# Patient Record
Sex: Female | Born: 1960 | Race: White | Hispanic: No | Marital: Single | Smoking: Current every day smoker
Health system: Southern US, Community
[De-identification: ages and names within clinical notes are randomized; demographics above are authoritative.]

## PROBLEM LIST (undated history)

## (undated) DIAGNOSIS — G8929 Other chronic pain: Secondary | ICD-10-CM

## (undated) DIAGNOSIS — IMO0001 Reserved for inherently not codable concepts without codable children: Secondary | ICD-10-CM

## (undated) DIAGNOSIS — R519 Headache, unspecified: Secondary | ICD-10-CM

## (undated) DIAGNOSIS — J449 Chronic obstructive pulmonary disease, unspecified: Secondary | ICD-10-CM

## (undated) DIAGNOSIS — F319 Bipolar disorder, unspecified: Secondary | ICD-10-CM

## (undated) DIAGNOSIS — IMO0002 Reserved for concepts with insufficient information to code with codable children: Secondary | ICD-10-CM

## (undated) DIAGNOSIS — M797 Fibromyalgia: Secondary | ICD-10-CM

## (undated) DIAGNOSIS — J45909 Unspecified asthma, uncomplicated: Secondary | ICD-10-CM

## (undated) DIAGNOSIS — M1990 Unspecified osteoarthritis, unspecified site: Secondary | ICD-10-CM

## (undated) DIAGNOSIS — F329 Major depressive disorder, single episode, unspecified: Secondary | ICD-10-CM

## (undated) DIAGNOSIS — I10 Essential (primary) hypertension: Secondary | ICD-10-CM

## (undated) DIAGNOSIS — J302 Other seasonal allergic rhinitis: Secondary | ICD-10-CM

## (undated) DIAGNOSIS — Z87898 Personal history of other specified conditions: Secondary | ICD-10-CM

## (undated) DIAGNOSIS — F32A Depression, unspecified: Secondary | ICD-10-CM

## (undated) DIAGNOSIS — G4700 Insomnia, unspecified: Secondary | ICD-10-CM

## (undated) DIAGNOSIS — M48062 Spinal stenosis, lumbar region with neurogenic claudication: Secondary | ICD-10-CM

## (undated) DIAGNOSIS — M51369 Other intervertebral disc degeneration, lumbar region without mention of lumbar back pain or lower extremity pain: Secondary | ICD-10-CM

## (undated) DIAGNOSIS — E119 Type 2 diabetes mellitus without complications: Secondary | ICD-10-CM

## (undated) DIAGNOSIS — Z87442 Personal history of urinary calculi: Secondary | ICD-10-CM

## (undated) DIAGNOSIS — M542 Cervicalgia: Secondary | ICD-10-CM

## (undated) DIAGNOSIS — M549 Dorsalgia, unspecified: Secondary | ICD-10-CM

## (undated) DIAGNOSIS — F419 Anxiety disorder, unspecified: Secondary | ICD-10-CM

## (undated) DIAGNOSIS — M5136 Other intervertebral disc degeneration, lumbar region: Secondary | ICD-10-CM

## (undated) DIAGNOSIS — R51 Headache: Secondary | ICD-10-CM

## (undated) HISTORY — PX: TUBAL LIGATION: SHX77

## (undated) HISTORY — PX: ABDOMINAL HYSTERECTOMY: SHX81

## (undated) HISTORY — PX: BREAST LUMPECTOMY: SHX2

## (undated) HISTORY — PX: KNEE SURGERY: SHX244

## (undated) HISTORY — PX: ABDOMINAL EXPLORATION SURGERY: SHX538

## (undated) HISTORY — PX: COLONOSCOPY: SHX174

## (undated) HISTORY — PX: CHOLECYSTECTOMY: SHX55

## (undated) HISTORY — PX: MULTIPLE TOOTH EXTRACTIONS: SHX2053

## (undated) HISTORY — PX: BREAST SURGERY: SHX581

## (undated) HISTORY — PX: DILATION AND CURETTAGE OF UTERUS: SHX78

## (undated) HISTORY — PX: BACK SURGERY: SHX140

---

## 2002-12-20 ENCOUNTER — Emergency Department (HOSPITAL_COMMUNITY): Admission: EM | Admit: 2002-12-20 | Discharge: 2002-12-20 | Payer: Self-pay | Admitting: Emergency Medicine

## 2002-12-20 ENCOUNTER — Encounter: Payer: Self-pay | Admitting: Emergency Medicine

## 2002-12-22 ENCOUNTER — Encounter: Admission: RE | Admit: 2002-12-22 | Discharge: 2002-12-22 | Payer: Self-pay | Admitting: Internal Medicine

## 2004-07-22 HISTORY — PX: ABDOMINAL HYSTERECTOMY: SHX81

## 2005-06-05 ENCOUNTER — Ambulatory Visit: Payer: Self-pay | Admitting: Cardiology

## 2009-11-03 ENCOUNTER — Emergency Department (HOSPITAL_COMMUNITY): Admission: EM | Admit: 2009-11-03 | Discharge: 2009-11-04 | Payer: Self-pay | Admitting: Emergency Medicine

## 2009-11-04 IMAGING — CR DG CHEST 2V
2 series · 2 of 2 positions shown · non-contrast
Comparison: None.

CLINICAL DATA: Upper back pain

CHEST - 2 VIEW

[w chest pa]
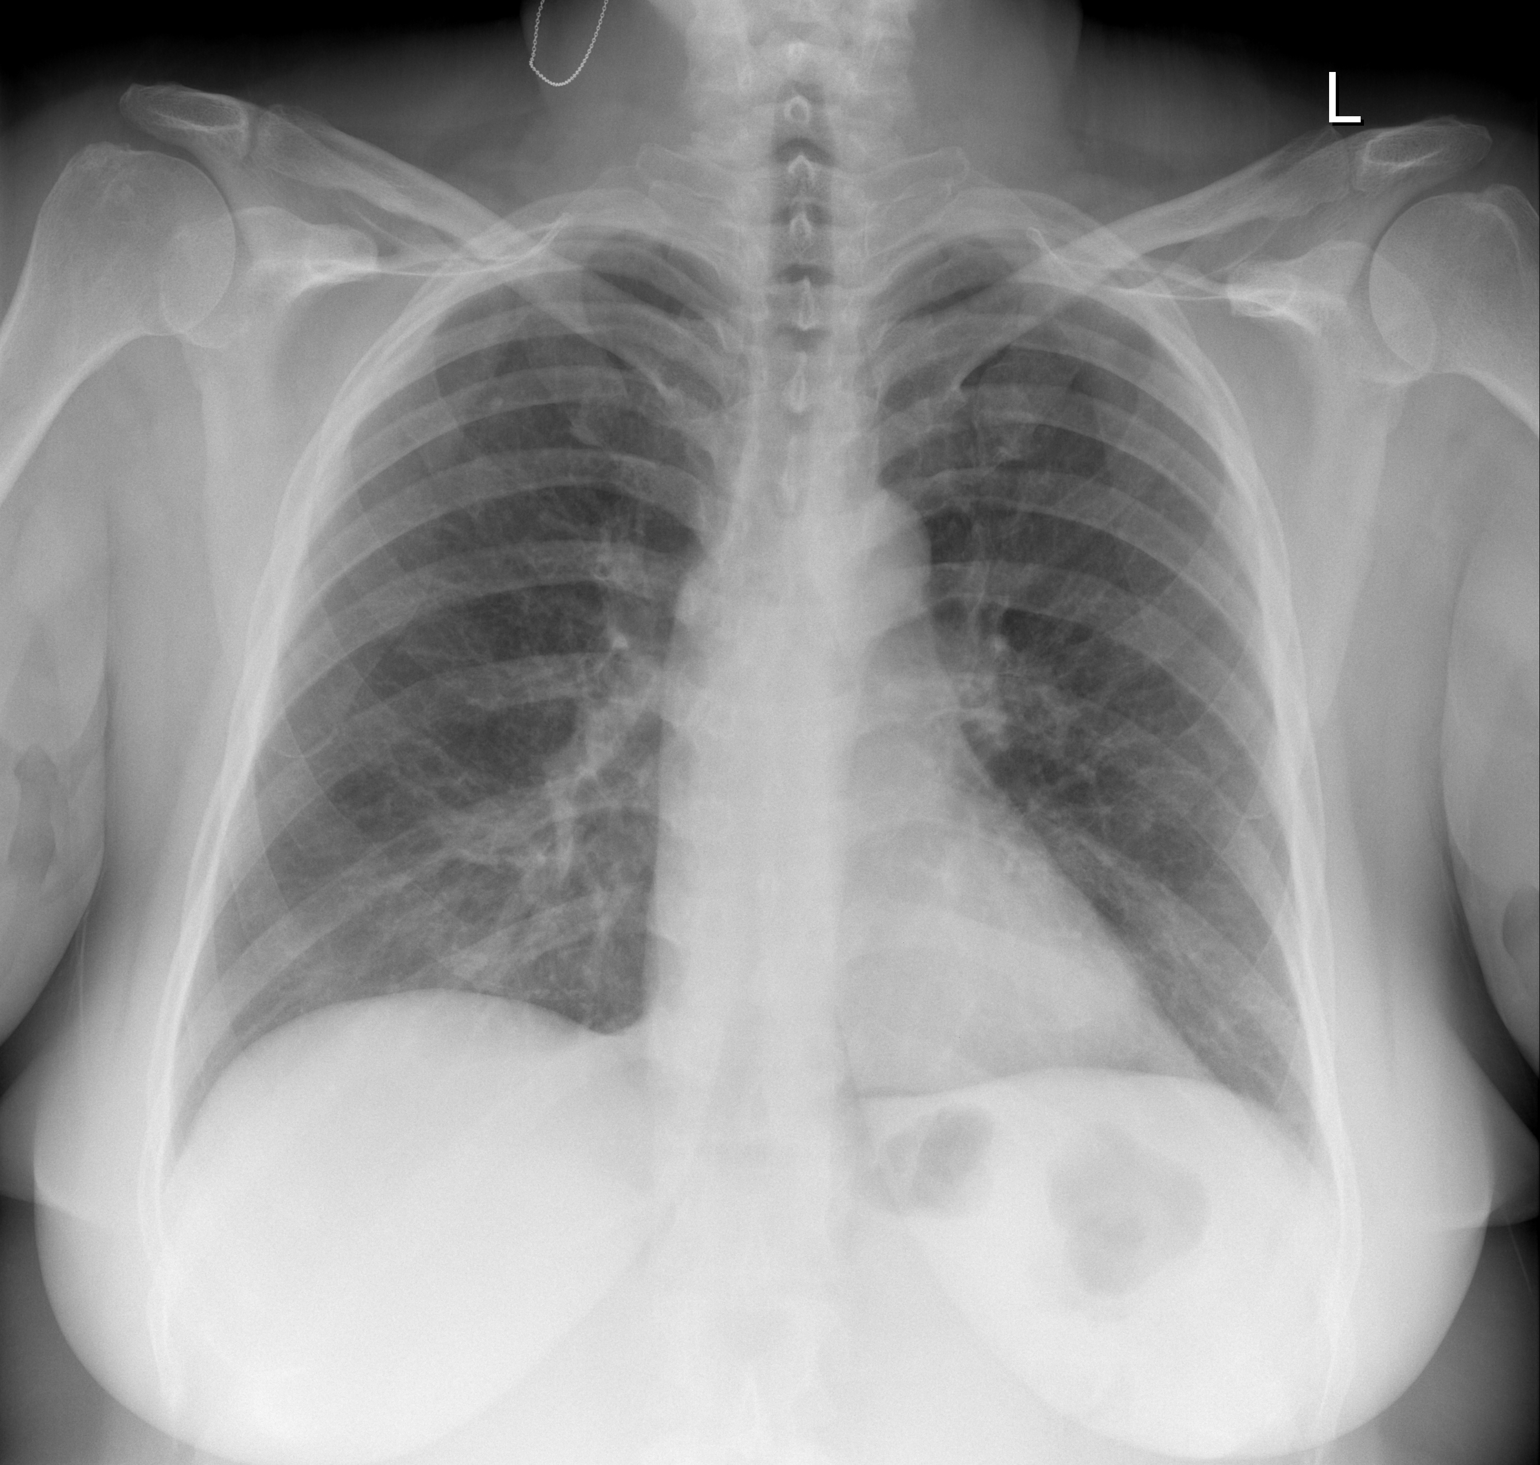

[w chest lat]
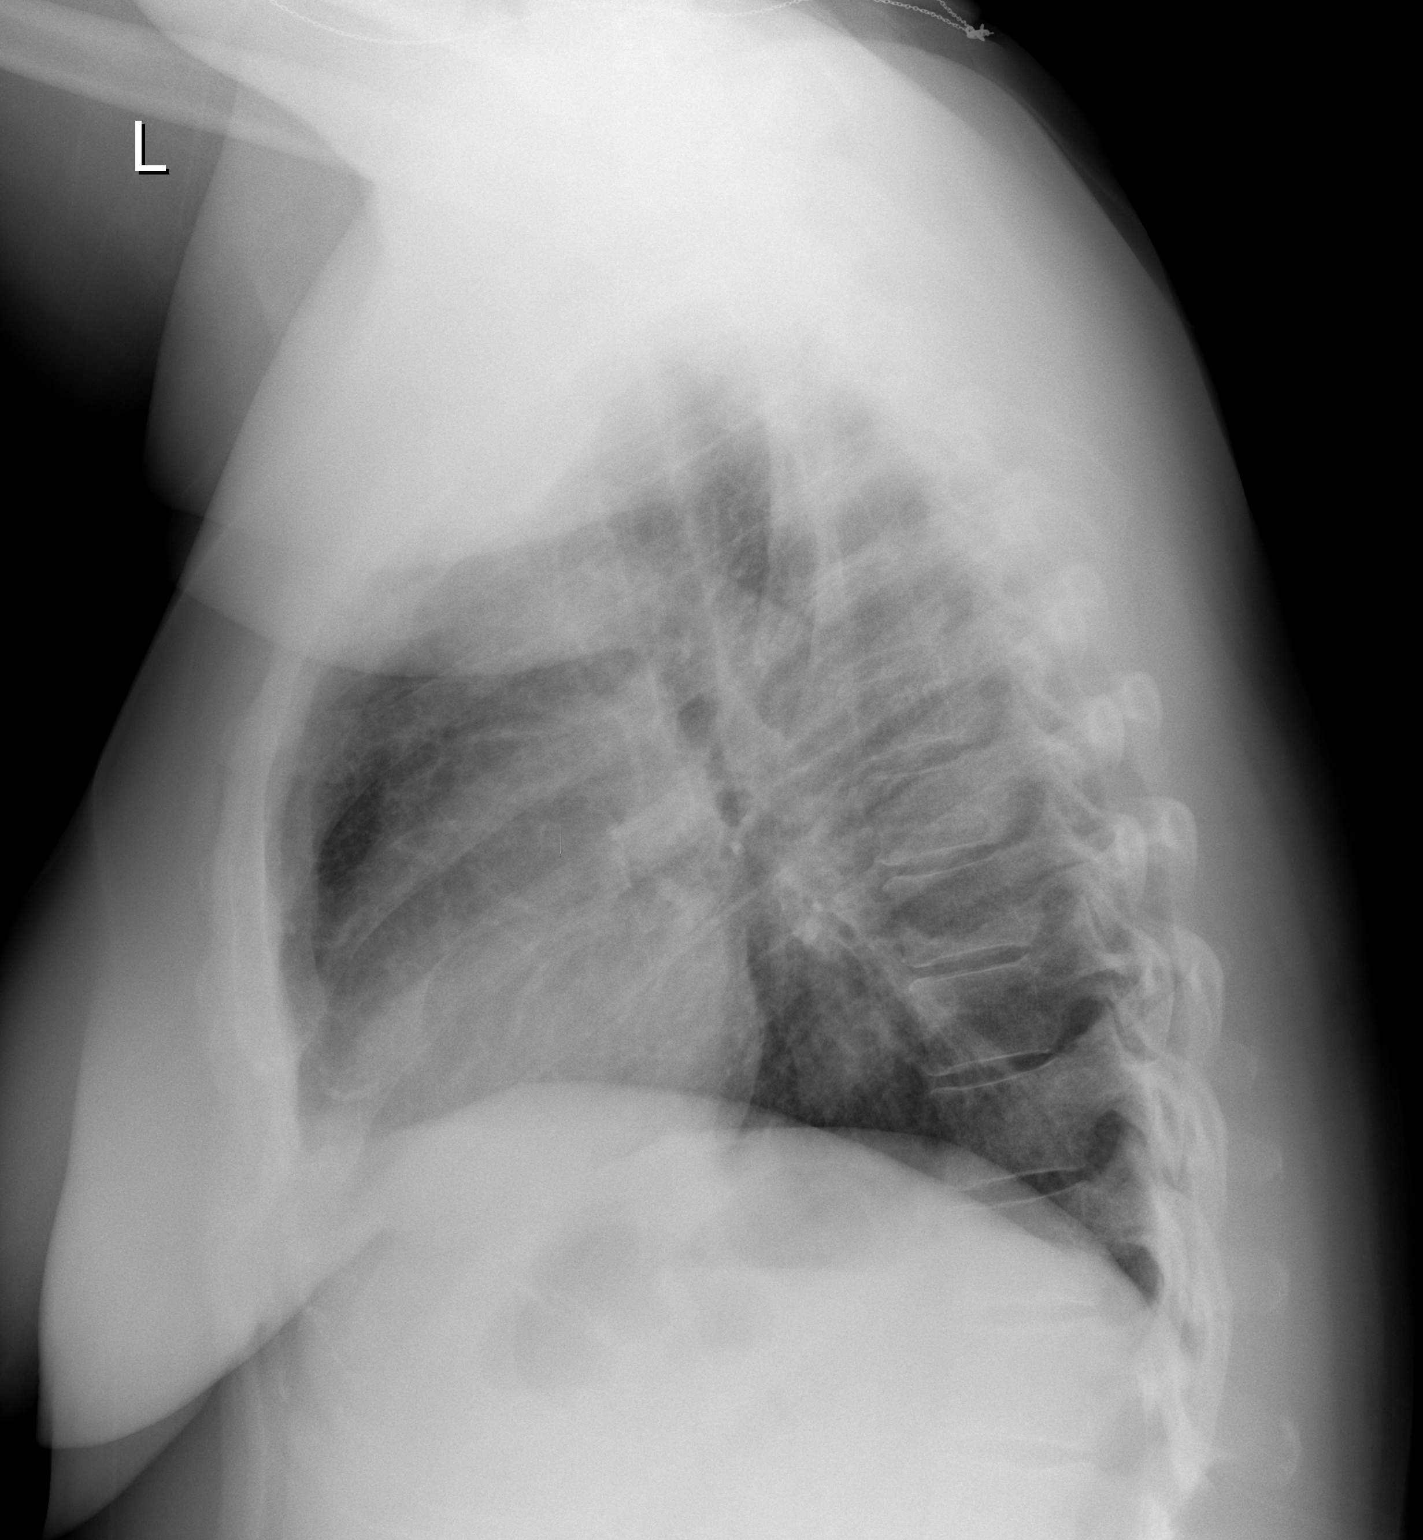

[2 of 2 positions shown; findings below may reference images not displayed]

FINDINGS: Cardiomediastinal silhouette is within normal limits. The
lungs are clear. No pleural effusion.  No pneumothorax.  No acute
osseous abnormality.
IMPRESSION: Normal chest.

## 2009-11-11 ENCOUNTER — Emergency Department (HOSPITAL_COMMUNITY): Admission: EM | Admit: 2009-11-11 | Discharge: 2009-11-12 | Payer: Self-pay | Admitting: Emergency Medicine

## 2009-11-11 IMAGING — CR DG CHEST 2V
2 series · 2 of 2 positions shown · non-contrast
Comparison: Chest radiograph performed [DATE]

CLINICAL DATA: Shortness of breath; history of smoking.

CHEST - 2 VIEW

[w chest pa]
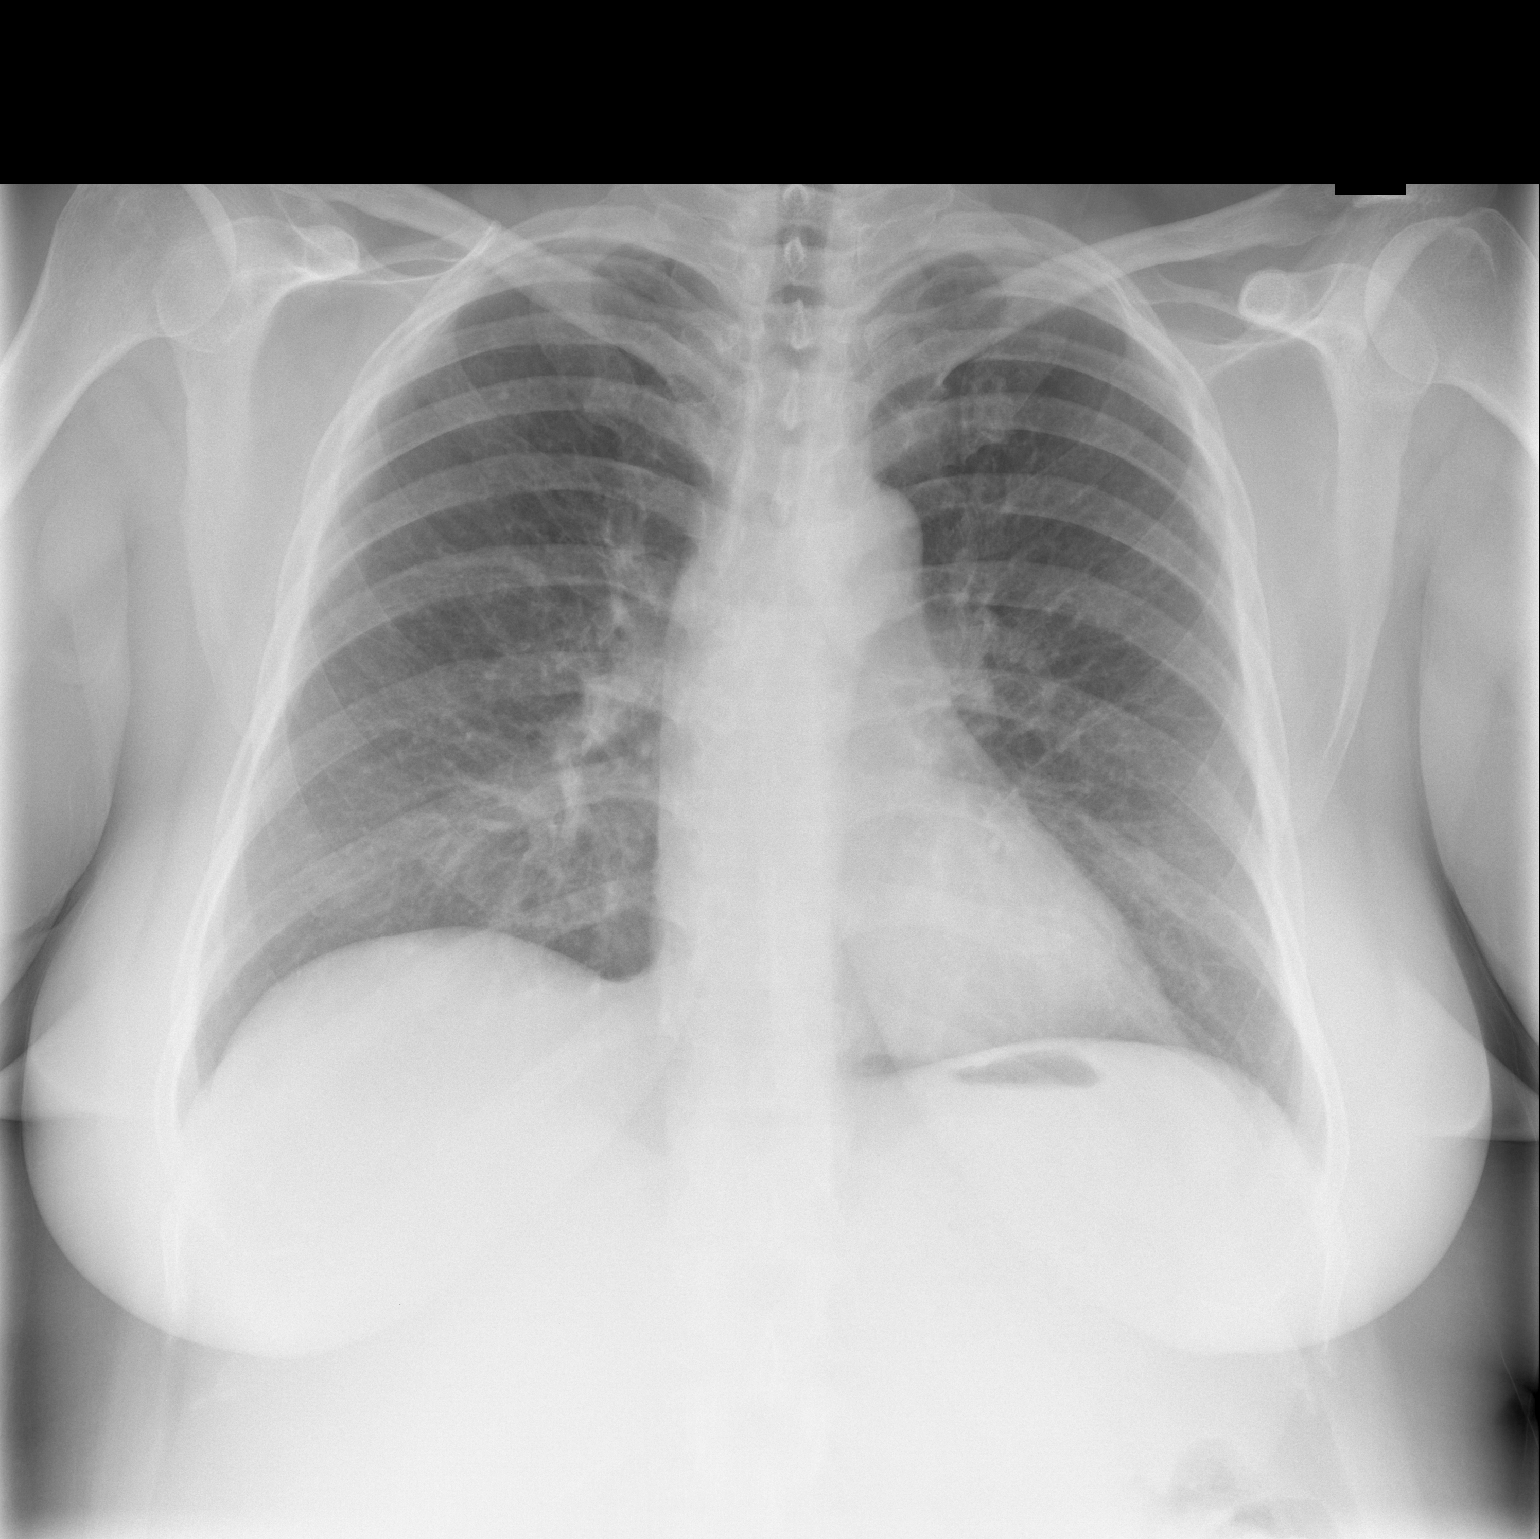

[w chest lat]
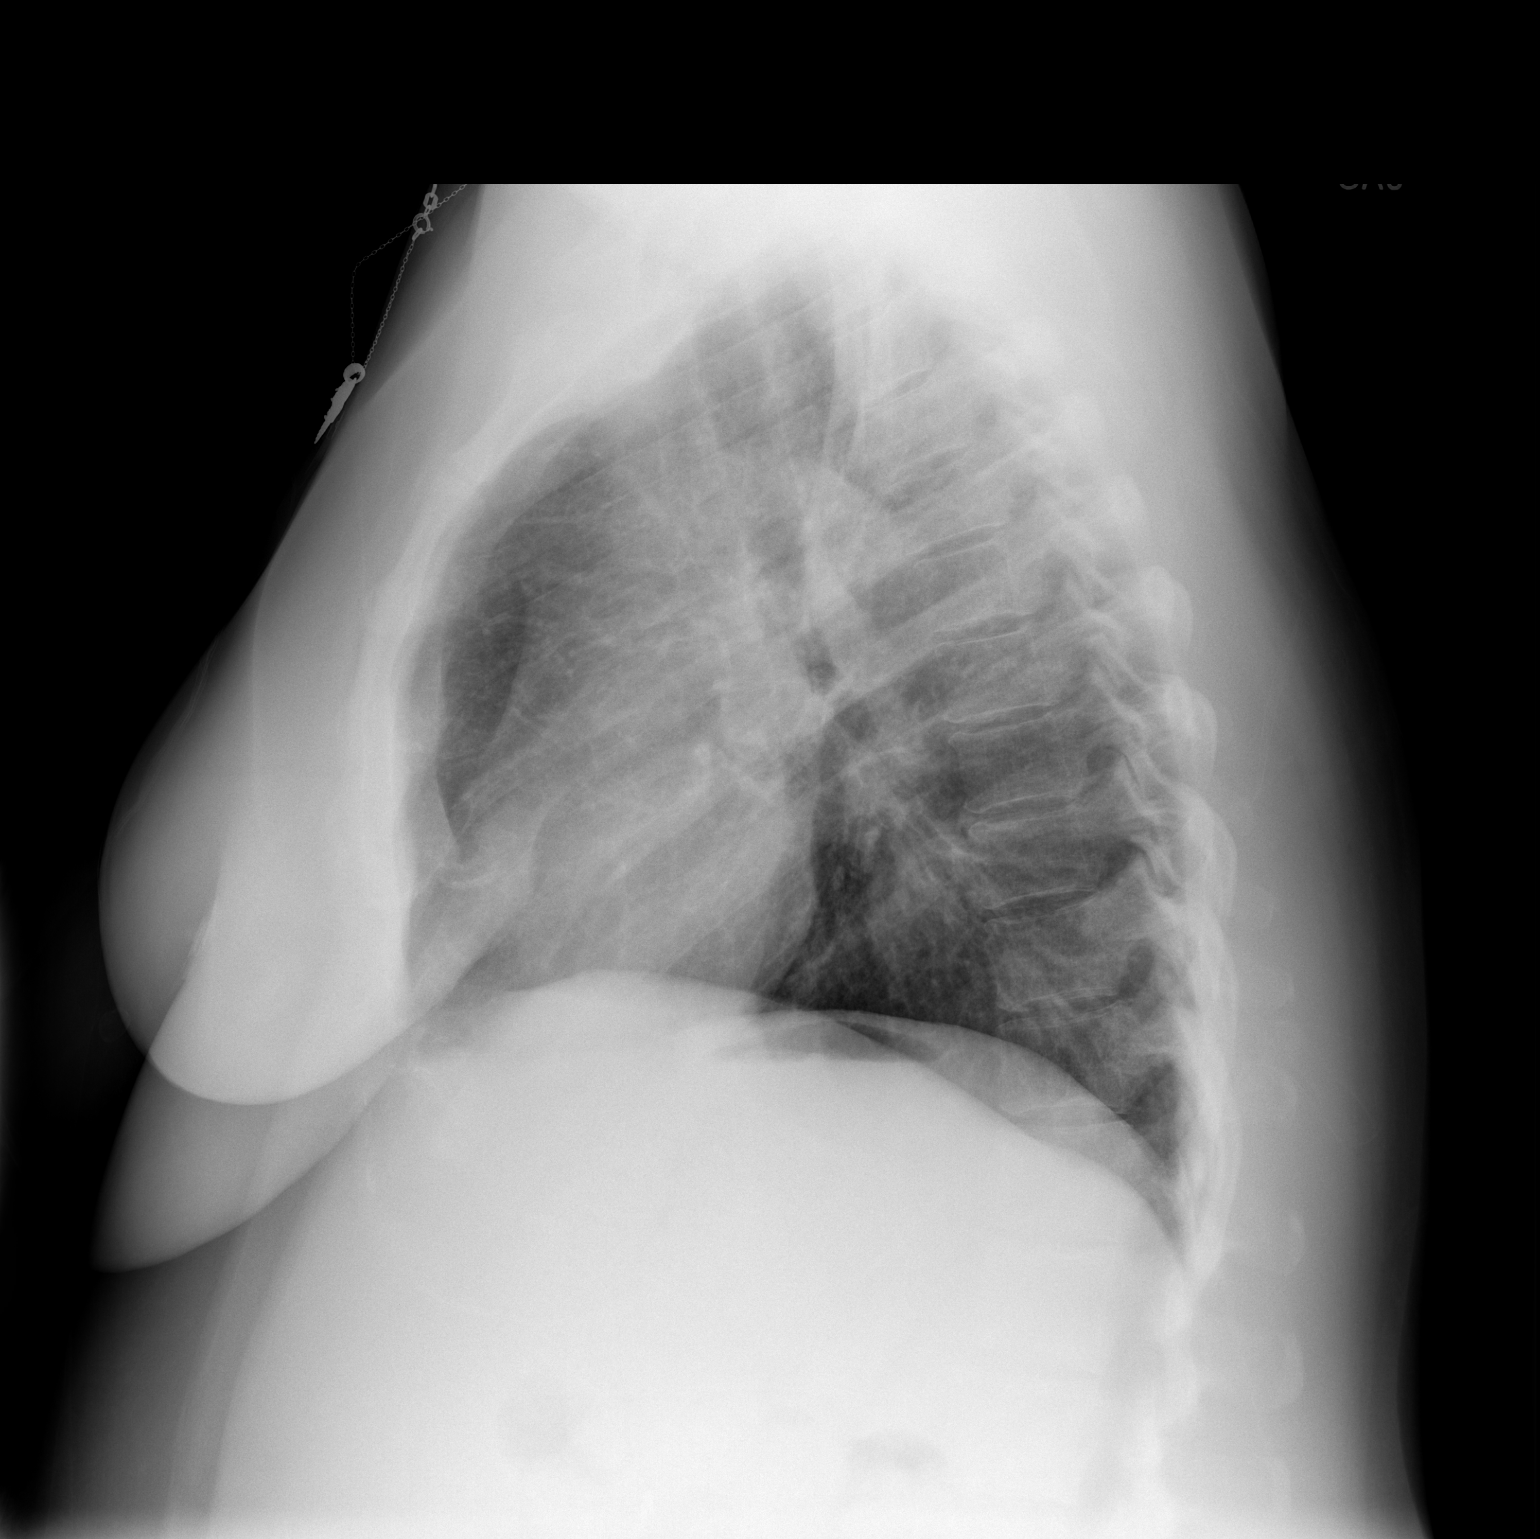

[2 of 2 positions shown; findings below may reference images not displayed]

FINDINGS: The lungs are well-aerated and clear.  There is no
evidence of focal opacification, pleural effusion or pneumothorax.

The heart is normal in size; the mediastinal contour is within
normal limits.  No acute osseous abnormalities are seen.
IMPRESSION: No acute cardiopulmonary process seen.

## 2009-12-05 ENCOUNTER — Emergency Department (HOSPITAL_COMMUNITY): Admission: EM | Admit: 2009-12-05 | Discharge: 2009-12-05 | Payer: Self-pay | Admitting: Emergency Medicine

## 2010-10-09 LAB — CBC
Hemoglobin: 13.5 g/dL (ref 12.0–15.0)
MCHC: 35.1 g/dL (ref 30.0–36.0)
MCV: 99.1 fL (ref 78.0–100.0)
RBC: 3.87 MIL/uL (ref 3.87–5.11)
RDW: 14.8 % (ref 11.5–15.5)

## 2010-10-09 LAB — URINALYSIS, ROUTINE W REFLEX MICROSCOPIC
Bilirubin Urine: NEGATIVE
Ketones, ur: NEGATIVE mg/dL
Nitrite: NEGATIVE
Protein, ur: NEGATIVE mg/dL
Specific Gravity, Urine: 1.018 (ref 1.005–1.030)
Urobilinogen, UA: 1 mg/dL (ref 0.0–1.0)

## 2010-10-09 LAB — BASIC METABOLIC PANEL
CO2: 26 mEq/L (ref 19–32)
Calcium: 9.4 mg/dL (ref 8.4–10.5)
Chloride: 99 mEq/L (ref 96–112)
GFR calc Af Amer: >60 mL/min (ref >60)
Potassium: 3 mEq/L — ABNORMAL LOW (ref 3.5–5.1)
Sodium: 136 mEq/L (ref 135–145)

## 2010-10-09 LAB — DIFFERENTIAL
Basophils Absolute: 0.3 10*3/uL — ABNORMAL HIGH (ref 0.0–0.1)
Basophils Relative: 2 % — ABNORMAL HIGH (ref 0–1)
Eosinophils Absolute: 0.3 10*3/uL (ref 0.0–0.7)
Monocytes Absolute: 1.1 10*3/uL — ABNORMAL HIGH (ref 0.1–1.0)
Monocytes Relative: 8 % (ref 3–12)
Neutro Abs: 8.3 10*3/uL — ABNORMAL HIGH (ref 1.7–7.7)

## 2010-10-09 LAB — HEPATIC FUNCTION PANEL
AST: 42 U/L — ABNORMAL HIGH (ref 0–37)
Albumin: 3.1 g/dL — ABNORMAL LOW (ref 3.5–5.2)
Total Bilirubin: 0.5 mg/dL (ref 0.3–1.2)
Total Protein: 6.9 g/dL (ref 6.0–8.3)

## 2010-10-09 LAB — BRAIN NATRIURETIC PEPTIDE: Pro B Natriuretic peptide (BNP): <30 pg/mL (ref 0.0–100.0)

## 2013-11-18 ENCOUNTER — Emergency Department (HOSPITAL_COMMUNITY)
Admission: EM | Admit: 2013-11-18 | Discharge: 2013-11-19 | Disposition: A | Discharge status: Home / Self Care | Payer: Medicaid Other | Attending: Emergency Medicine | Admitting: Emergency Medicine

## 2013-11-18 ENCOUNTER — Encounter (HOSPITAL_COMMUNITY): Payer: Self-pay | Admitting: Emergency Medicine

## 2013-11-18 DIAGNOSIS — M549 Dorsalgia, unspecified: Secondary | ICD-10-CM | POA: Insufficient documentation

## 2013-11-18 DIAGNOSIS — I10 Essential (primary) hypertension: Secondary | ICD-10-CM | POA: Insufficient documentation

## 2013-11-18 DIAGNOSIS — J4489 Other specified chronic obstructive pulmonary disease: Secondary | ICD-10-CM | POA: Insufficient documentation

## 2013-11-18 DIAGNOSIS — J449 Chronic obstructive pulmonary disease, unspecified: Secondary | ICD-10-CM | POA: Insufficient documentation

## 2013-11-18 DIAGNOSIS — F17200 Nicotine dependence, unspecified, uncomplicated: Secondary | ICD-10-CM | POA: Insufficient documentation

## 2013-11-18 DIAGNOSIS — Z8739 Personal history of other diseases of the musculoskeletal system and connective tissue: Secondary | ICD-10-CM | POA: Insufficient documentation

## 2013-11-18 HISTORY — DX: Chronic obstructive pulmonary disease, unspecified: J44.9

## 2013-11-18 HISTORY — DX: Reserved for concepts with insufficient information to code with codable children: IMO0002

## 2013-11-18 HISTORY — DX: Other seasonal allergic rhinitis: J30.2

## 2013-11-18 HISTORY — DX: Insomnia, unspecified: G47.00

## 2013-11-18 HISTORY — DX: Essential (primary) hypertension: I10

## 2013-11-18 HISTORY — DX: Unspecified asthma, uncomplicated: J45.909

## 2013-11-18 MED ORDER — OXYCODONE-ACETAMINOPHEN 5-325 MG PO TABS
2.0000 | ORAL_TABLET | Freq: Once | ORAL | Status: AC
  Administered 2013-11-19: 2 via ORAL
  Filled 2013-11-18: qty 2

## 2013-11-18 MED ORDER — OXYCODONE-ACETAMINOPHEN 5-325 MG PO TABS: 1.0000 | ORAL_TABLET | ORAL | Status: DC | PRN

## 2013-11-18 NOTE — ED Notes (Signed)
Pt c/o seasonal allergies making her cough. Reports upper mid back pain that is worse with movement and when she coughs.

## 2013-11-18 NOTE — ED Provider Notes (Signed)
CSN: [id number]     Arrival date & time 11/18/13  2307 History  This chart was scribed for Joya Gaskinsonald W Hitesh Fouche, MD by Charline BillsEssence Howell, ED Scribe. The patient was seen in room APA12/APA12. Patient's care was started at 11:46 PM.    Chief Complaint  Patient presents with  . Cough  . Back Pain    Patient is a 53 y.o. female presenting with cough and back pain. The history is provided by the patient. No language interpreter was used.  Cough Cough characteristics:  Dry Severity:  Severe Onset quality:  Unable to specify Progression:  Worsening Chronicity:  New Smoker: yes   Relieved by:  Nothing Worsened by:  Deep breathing Associated symptoms: no chest pain and no fever   Back Pain Associated symptoms: no abdominal pain, no chest pain, no dysuria, no fever and no weakness    HPI Comments: Colin InaConnie Pagan is a 53 y.o. female who presents to the Emergency Department complaining of gradually worsening cough. Pt states that she has seasonal allergies which is making her cough worse. She also reports associated SOB and upper back pain. She thinks that she has pulled a muscle from coughing hard. She denies fever, vomiting, abdominal pain, chest pain, dysuria, difficulty urinating, and any new weakness in extremities. Pt has takes Tylenol and used a heating pad with no relief.   Pt is a smoker. She states that she quit but started smoking again 2-3 months ago.  Past Medical History  Diagnosis Date  . Asthma   . COPD (chronic obstructive pulmonary disease)   . Seasonal allergies   . Hypertension   . DDD (degenerative disc disease)   . Insomnia    Past Surgical History  Procedure Laterality Date  . Breast surgery    . Cholecystectomy    . Abdominal hysterectomy    . Tubal ligation    . Abdominal exploration surgery    . Knee surgery     No family history on file. History  Substance Use Topics  . Smoking status: Current Every Day Smoker -- 1.00 packs/day    Types: Cigarettes  .  Smokeless tobacco: Not on file  . Alcohol Use: No   OB History   Grav Para Term Preterm Abortions TAB SAB Ect Mult Living                 Review of Systems  Constitutional: Negative for fever.  Respiratory: Positive for cough.   Cardiovascular: Negative for chest pain.  Gastrointestinal: Negative for vomiting and abdominal pain.  Genitourinary: Negative for dysuria and difficulty urinating.  Musculoskeletal: Positive for back pain.  Neurological: Negative for weakness.     Allergies  Ibuprofen; Keflex; Nsaids; Remeron; Sulfa antibiotics; Toradol; and Wellbutrin  Home Medications   Prior to Admission medications   Not on File   Triage Vitals: BP 166/90  Pulse 107  Temp(Src) 98.7 F (37.1 C) (Oral)  Resp 24  Ht 5\' 4"  (1.626 m)  Wt 230 lb (104.327 kg)  BMI 39.46 kg/m2  SpO2 97% Physical Exam CONSTITUTIONAL: Well developed/well nourished HEAD: Normocephalic/atraumatic EYES: EOMI/PERRL ENMT: Mucous membranes moist NECK: supple no meningeal signs SPINE: mild thoracic tenderness, No bruising/crepitance/stepoffs noted to spine CV: S1/S2 noted, no murmurs/rubs/gallops noted LUNGS: Lungs are clear to auscultation bilaterally, no apparent distress ABDOMEN: soft, nontender, no rebound or guarding GU:no cva tenderness NEURO: Awake/alert, equal motor 5/5 strength noted with the following: hip flexion/knee flexion/extension, foot dorsi/plantar flexion, no sensory deficit in any dermatome.  Equal patellar/achilles  reflex noted (2+) in bilateral lower extremities.  Pt is able to ambulate unassisted. EXTREMITIES: pulses normal, full ROM SKIN: warm, color normal PSYCH: no abnormalities of mood noted     ED Course  Procedures DIAGNOSTIC STUDIES: Oxygen Saturation is 97% on RA, normal by my interpretation.    COORDINATION OF CARE: 11:50 PM-Discussed treatment plan with pt at bedside and pt agreed to plan.   MDM   Final diagnoses:  Back pain    Nursing notes including  past medical history and social history reviewed and considered in documentation   I personally performed the services described in this documentation, which was scribed in my presence. The recorded information has been reviewed and is accurate.    Joya Gaskinsonald W Donnae Michels, MD 11/19/13 [phone]

## 2013-11-18 NOTE — Discharge Instructions (Signed)

## 2014-03-18 ENCOUNTER — Emergency Department (HOSPITAL_COMMUNITY)
Admission: EM | Admit: 2014-03-18 | Discharge: 2014-03-18 | Disposition: A | Discharge status: Home / Self Care | Payer: Medicaid Other | Attending: Emergency Medicine | Admitting: Emergency Medicine

## 2014-03-18 ENCOUNTER — Encounter (HOSPITAL_COMMUNITY): Payer: Self-pay | Admitting: Emergency Medicine

## 2014-03-18 ENCOUNTER — Emergency Department (HOSPITAL_COMMUNITY): Payer: Medicaid Other

## 2014-03-18 DIAGNOSIS — Y929 Unspecified place or not applicable: Secondary | ICD-10-CM | POA: Diagnosis not present

## 2014-03-18 DIAGNOSIS — Z8739 Personal history of other diseases of the musculoskeletal system and connective tissue: Secondary | ICD-10-CM | POA: Diagnosis not present

## 2014-03-18 DIAGNOSIS — Z79899 Other long term (current) drug therapy: Secondary | ICD-10-CM | POA: Insufficient documentation

## 2014-03-18 DIAGNOSIS — S335XXA Sprain of ligaments of lumbar spine, initial encounter: Secondary | ICD-10-CM | POA: Diagnosis not present

## 2014-03-18 DIAGNOSIS — S39012A Strain of muscle, fascia and tendon of lower back, initial encounter: Secondary | ICD-10-CM

## 2014-03-18 DIAGNOSIS — IMO0002 Reserved for concepts with insufficient information to code with codable children: Secondary | ICD-10-CM | POA: Insufficient documentation

## 2014-03-18 DIAGNOSIS — I10 Essential (primary) hypertension: Secondary | ICD-10-CM | POA: Insufficient documentation

## 2014-03-18 DIAGNOSIS — W108XXA Fall (on) (from) other stairs and steps, initial encounter: Secondary | ICD-10-CM | POA: Insufficient documentation

## 2014-03-18 DIAGNOSIS — F17200 Nicotine dependence, unspecified, uncomplicated: Secondary | ICD-10-CM | POA: Diagnosis not present

## 2014-03-18 DIAGNOSIS — Y9389 Activity, other specified: Secondary | ICD-10-CM | POA: Insufficient documentation

## 2014-03-18 DIAGNOSIS — J4489 Other specified chronic obstructive pulmonary disease: Secondary | ICD-10-CM | POA: Insufficient documentation

## 2014-03-18 DIAGNOSIS — W19XXXA Unspecified fall, initial encounter: Secondary | ICD-10-CM

## 2014-03-18 DIAGNOSIS — J449 Chronic obstructive pulmonary disease, unspecified: Secondary | ICD-10-CM | POA: Insufficient documentation

## 2014-03-18 IMAGING — CR DG LUMBAR SPINE COMPLETE 4+V
5 series · 5 of 5 positions shown · non-contrast
Comparison: None.

CLINICAL DATA: Lower back pain

EXAM:
LUMBAR SPINE - COMPLETE 4+ VIEW

[view not recorded (1 of 5)]
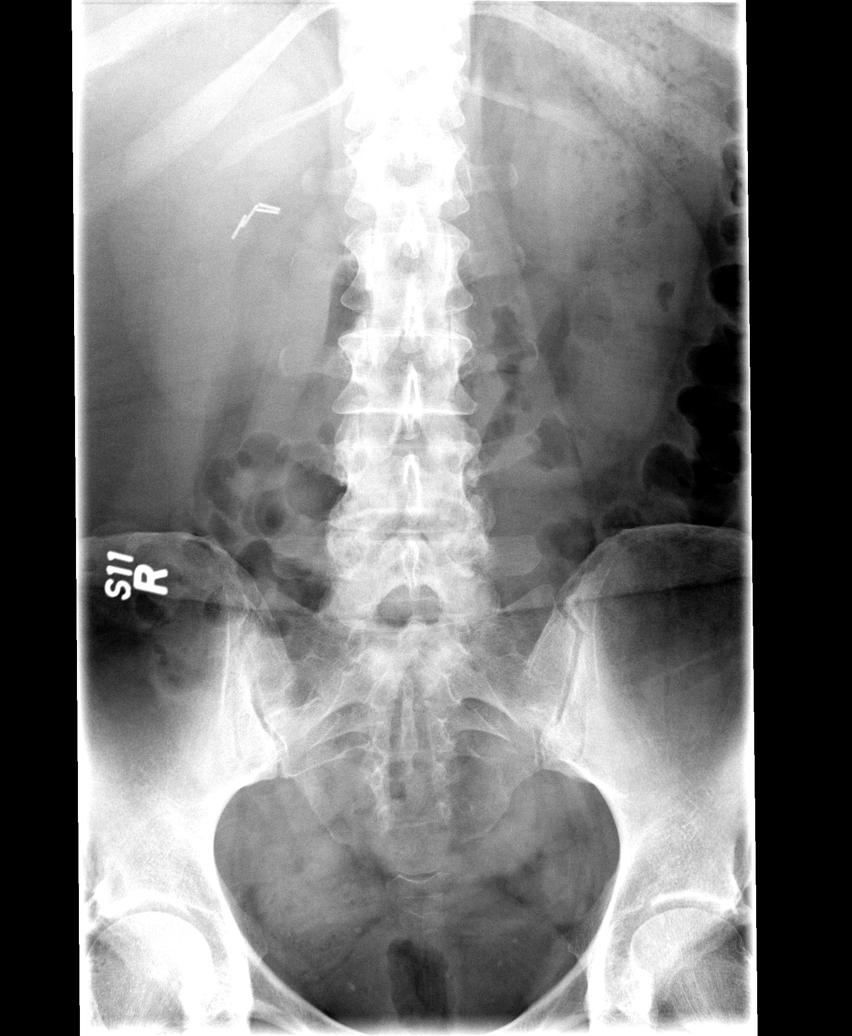

[view not recorded (2 of 5)]
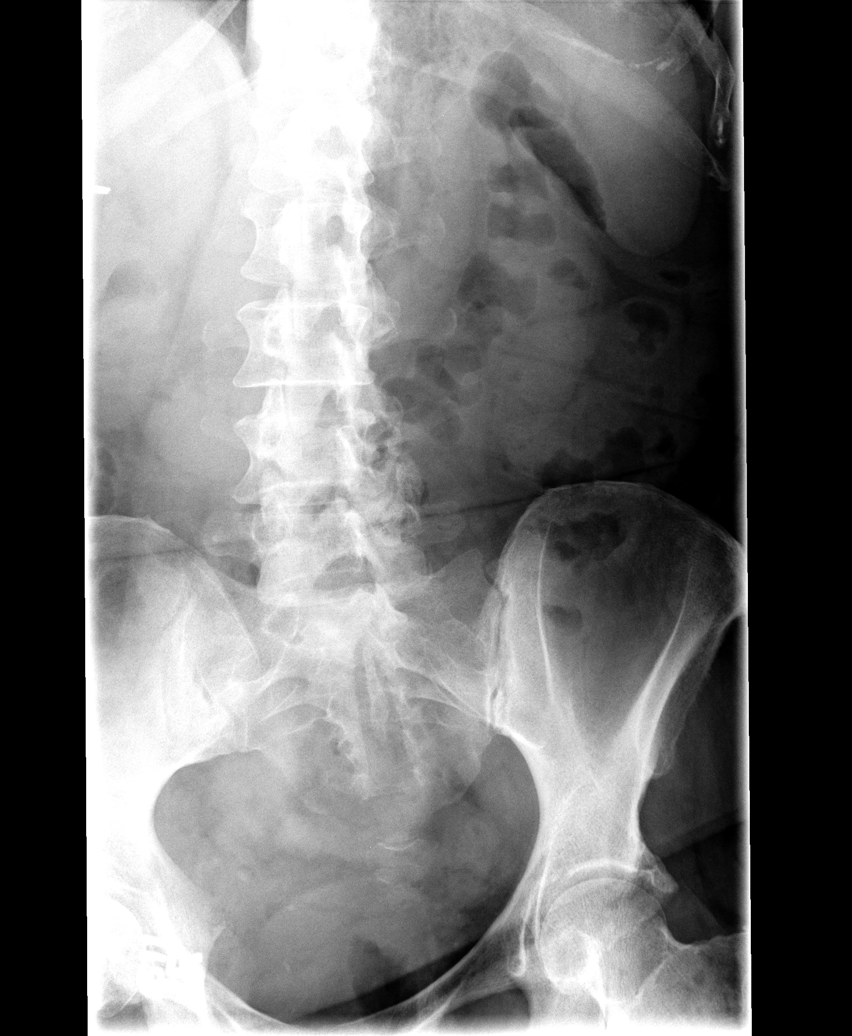

[view not recorded (3 of 5)]
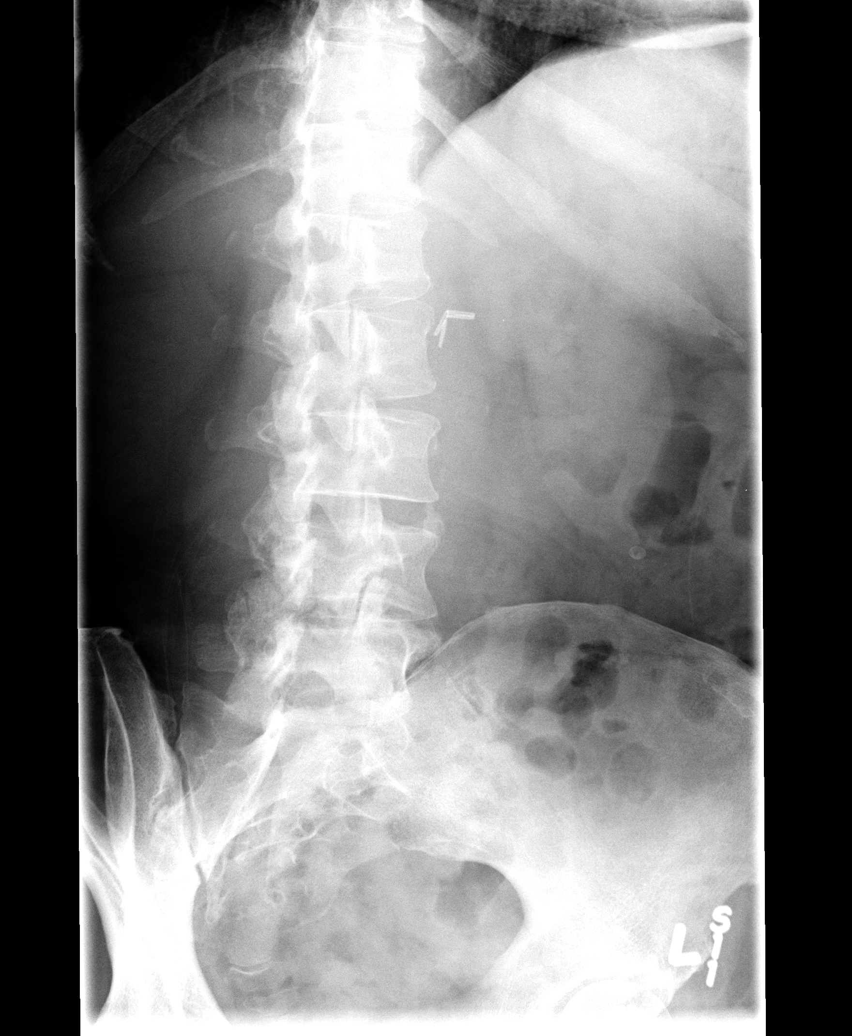

[view not recorded (4 of 5)]
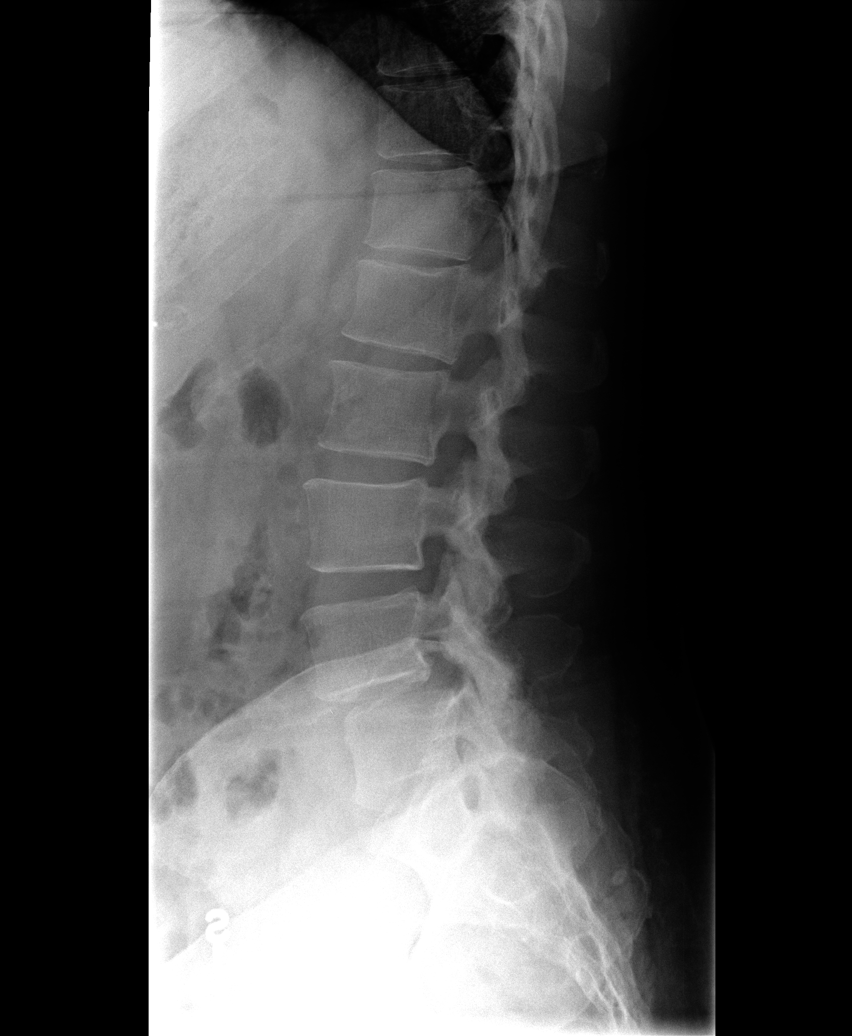

[view not recorded (5 of 5)]
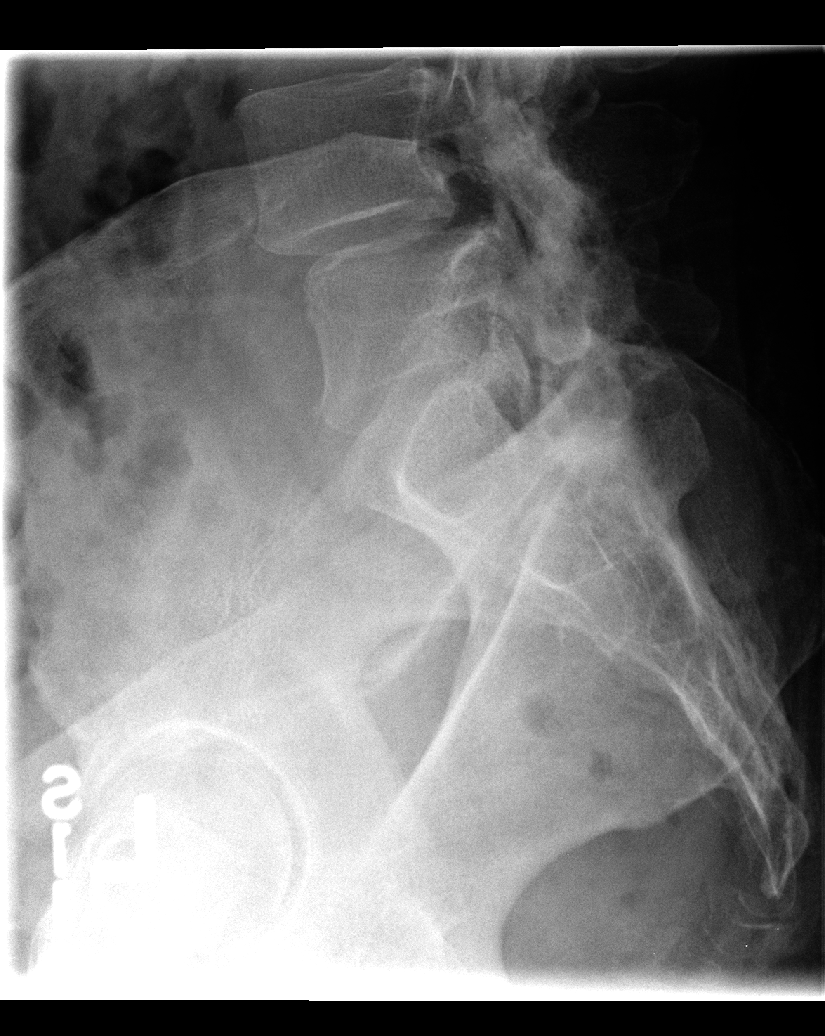

[5 of 5 positions shown; findings below may reference images not displayed]

FINDINGS: There is grade 1 anterolisthesis of L4 on L5. L4-5 and L5-S1
degenerative disc disease. L4-5 and L5-S1 facet degenerative change
mild anterior height loss of the T12 and L1 vertebral bodies, age
indeterminate. Minimal retrolisthesis L5 on S1. SI joints are
unremarkable. Cholecystectomy clips. Unremarkable bowel gas pattern.
IMPRESSION: Grade 1 anterolisthesis of L4 on L5 potentially secondary to facet
degenerative change at this level. If the patient is focally tender
at this level, consider evaluation with CT.

Age indeterminate anterior height loss of the T12 and L1 vertebral
bodies. Recommend correlation with patient's site of pain.

## 2014-03-18 IMAGING — CT CT L SPINE W/O CM
3 series · 12 of 33 positions shown, 14 images · non-contrast
Comparison: Current lumbar spine radiographs.

CLINICAL DATA: Back pain.  No known injury.  Abnormal radiograph.

EXAM:
CT LUMBAR SPINE WITHOUT CONTRAST
TECHNIQUE: Multidetector CT imaging of the lumbar spine was performed without
intravenous contrast administration. Multiplanar CT image
reconstructions were also generated.

[Series 3: lumbar spine 2.0 b30s · axial · 0.36mm/px · z∈[-370,-222]mm · 4 of 108 slices shown, 5 images]
[im 17/108  soft-tissue]
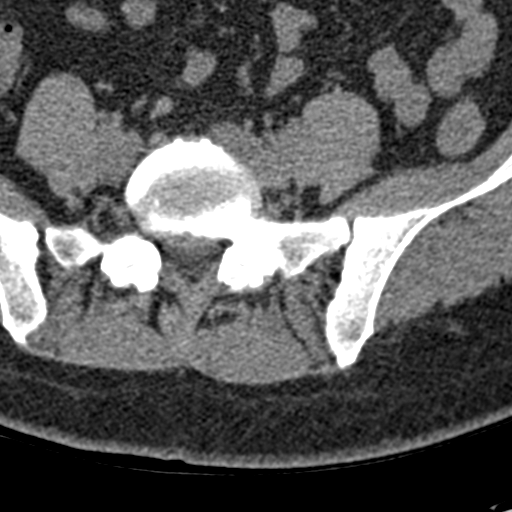
[im 17/108  bone]
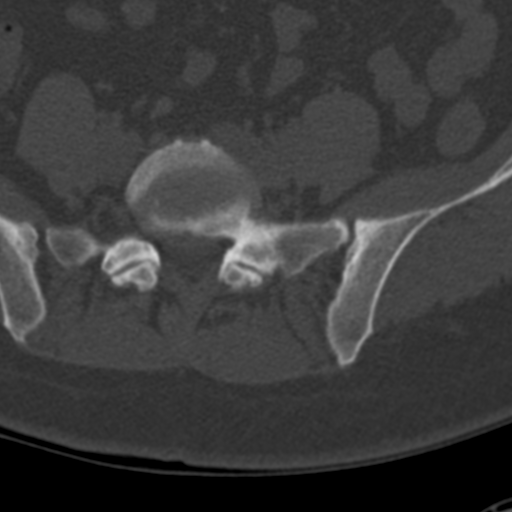
[im 42/108  bone]
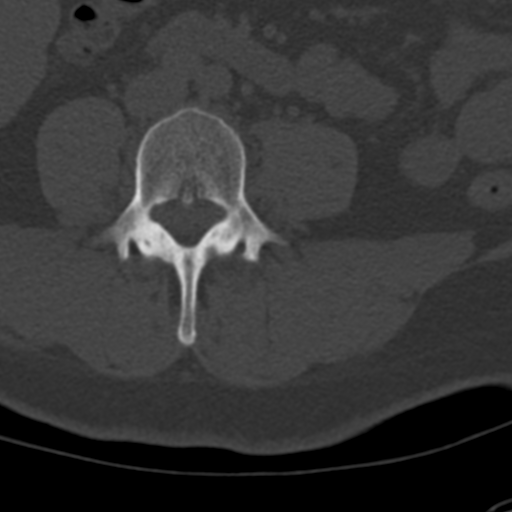
[im 66/108  bone]
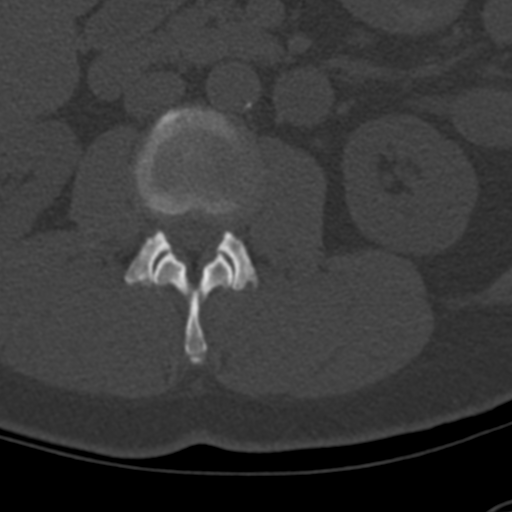
[im 91/108  bone]
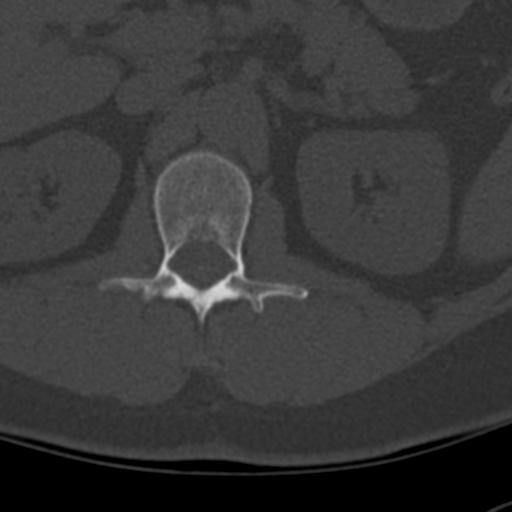

[Series 4: lumbar spine 2.0 spo cor · coronal · 0.37mm/px · 3 of 76 slices shown]
[im 16/76  bone]
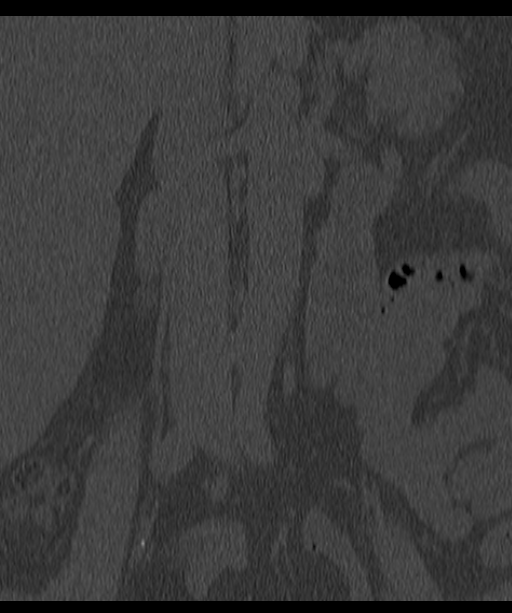
[im 31/76  bone]
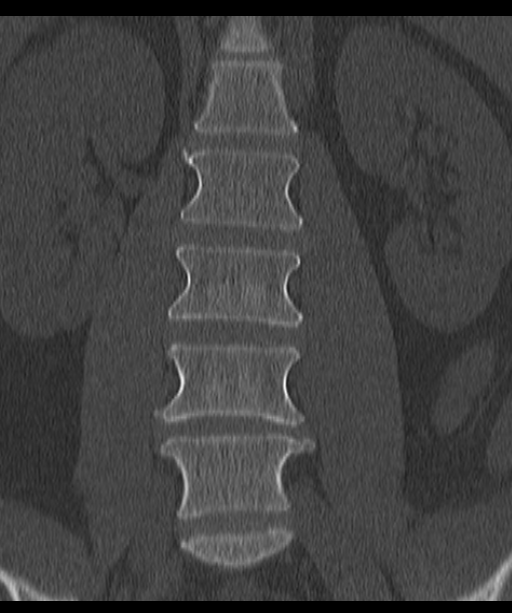
[im 46/76  bone]
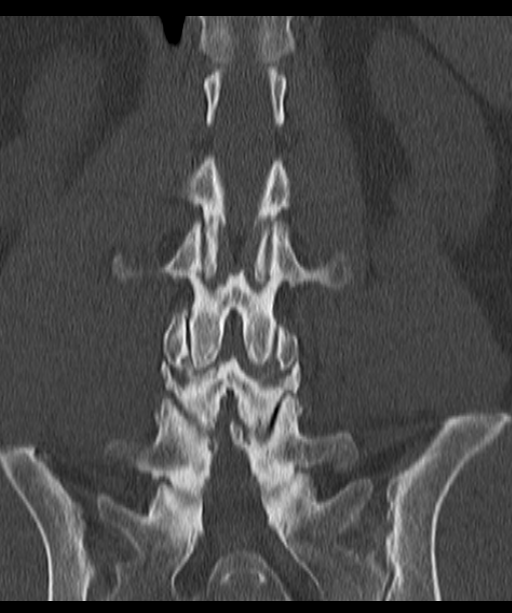

[Series 5: lumbar spine 2.0 spo · sagittal · 0.29mm/px · 5 of 55 slices shown, 6 images]
[im 19/55  bone]
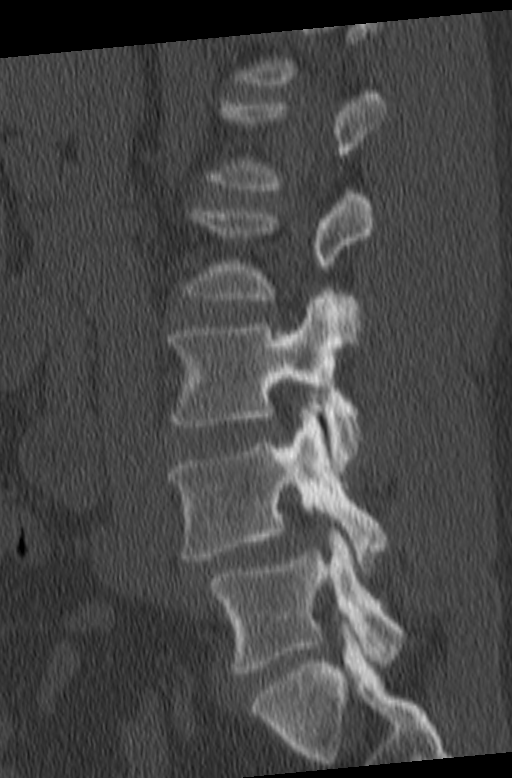
[im 23/55  bone]
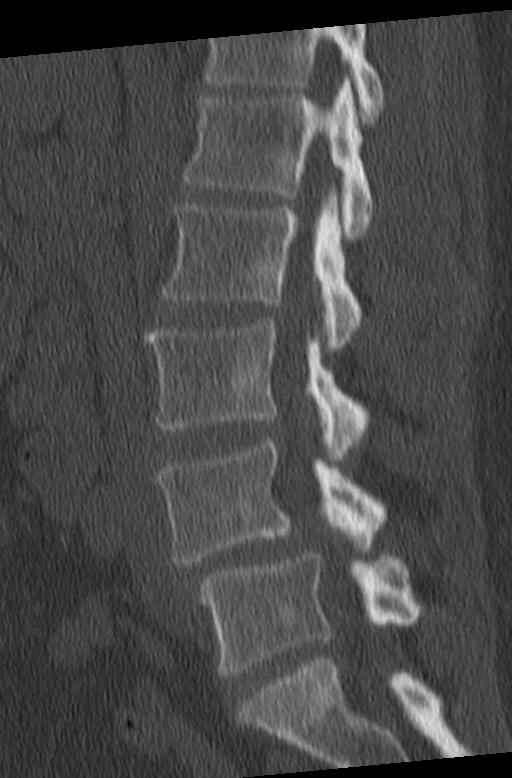
[im 28/55  soft-tissue]
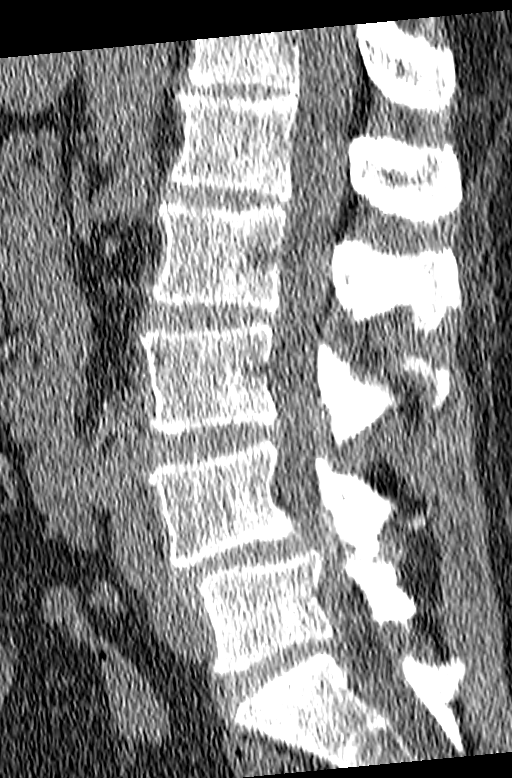
[im 28/55  bone]
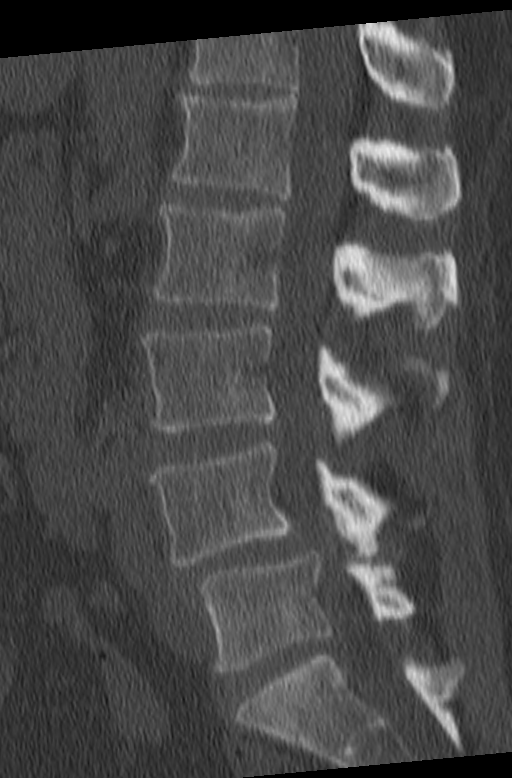
[im 32/55  bone]
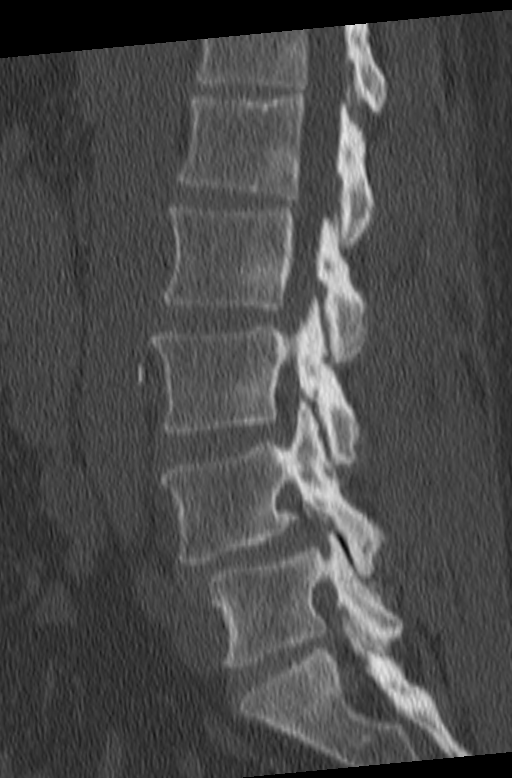
[im 37/55  bone]
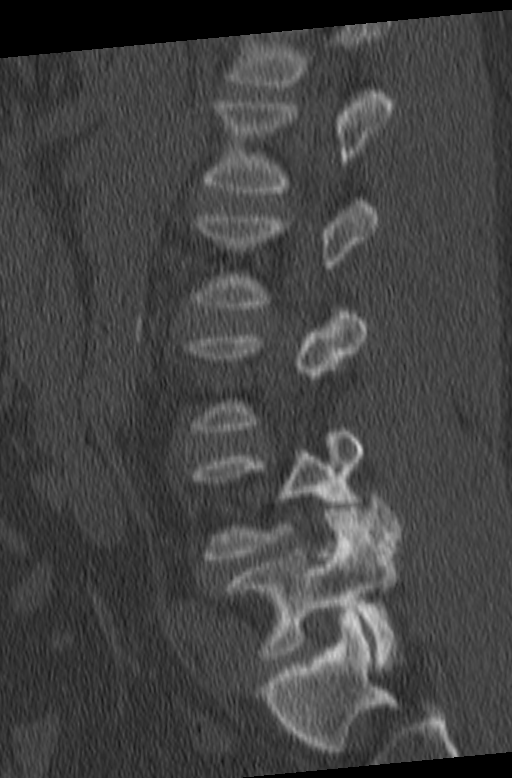

[12 of 33 positions shown; findings below may reference images not displayed]

FINDINGS: No fracture. Grade 1 anterolisthesis of L4 on L5 is due to a
combination of moderate to marked bilateral facet degenerative
change and disc degenerative change. There is also ligamentum flavum
enlargement. Combination causes at least moderate central stenosis
and lateral recess narrowing. No other spondylolisthesis.

T12-L1:  Normal.

L1-L2:  Minor disc bulging.  No stenosis.

L2-L3:  No significant disc bulging.  No stenosis.

L3-L4: Minor facet degenerative change and ligamentum flavum
enlargement. No significant disc bulging. No significant neural
foraminal narrowing.

L4-L5: As noted above, diffuse disc bulging, marked facet
degenerative change and ligamentum flavum enlargement and the
anterolisthesis combined to cause moderate to severe central
stenosis, at least moderate lateral recess narrowing and mild right
and moderate left neural foraminal narrowing.

L5-S1: Mild facet degenerative change. Minor disc bulging. Mild left
neural foraminal narrowing.

Surrounding soft tissues are unremarkable.
IMPRESSION: 1. No fracture or acute finding. No discrete disc herniation is
evident by CT.
2. Degenerative changes most evident at L4-L5 where marked facet
degenerative change and disc degenerative change leads to a grade 1
anterolisthesis. Combination of these findings causes moderate to
severe central stenosis, moderate lateral recess narrowing and
moderate left and mild right neural foraminal narrowing. These
degenerative changes, vertically the facet degenerative changes, may
be origin of this patient's pain.

## 2014-03-18 MED ORDER — HYDROCODONE-ACETAMINOPHEN 5-325 MG PO TABS: 1.0000 | ORAL_TABLET | ORAL | Status: DC | PRN

## 2014-03-18 MED ORDER — METHOCARBAMOL 500 MG PO TABS: 1000.0000 mg | ORAL_TABLET | Freq: Four times a day (QID) | ORAL | Status: AC

## 2014-03-18 MED ORDER — HYDROCODONE-ACETAMINOPHEN 5-325 MG PO TABS
1.0000 | ORAL_TABLET | Freq: Once | ORAL | Status: AC
  Administered 2014-03-18: 1 via ORAL
  Filled 2014-03-18: qty 1

## 2014-03-18 MED ORDER — METHOCARBAMOL 500 MG PO TABS
1000.0000 mg | ORAL_TABLET | Freq: Once | ORAL | Status: AC
  Administered 2014-03-18: 1000 mg via ORAL
  Filled 2014-03-18: qty 2

## 2014-03-18 NOTE — ED Provider Notes (Signed)
Medical screening examination/treatment/procedure(s) were performed by non-physician practitioner and as supervising physician I was immediately available for consultation/collaboration.  Flint Melter, MD 03/18/14 [phone]

## 2014-03-18 NOTE — Discharge Instructions (Signed)
Back Pain, Adult °Low back pain is very common. About 1 in 5 people have back pain. The cause of low back pain is rarely dangerous. The pain often gets better over time. About half of people with a sudden onset of back pain feel better in just 2 weeks. About 8 in 10 people feel better by 6 weeks.  °CAUSES °Some common causes of back pain include: °· Strain of the muscles or ligaments supporting the spine. °· Wear and tear (degeneration) of the spinal discs. °· Arthritis. °· Direct injury to the back. °DIAGNOSIS °Most of the time, the direct cause of low back pain is not known. However, back pain can be treated effectively even when the exact cause of the pain is unknown. Answering your caregiver's questions about your overall health and symptoms is one of the most accurate ways to make sure the cause of your pain is not dangerous. If your caregiver needs more information, he or she may order lab work or imaging tests (X-rays or MRIs). However, even if imaging tests show changes in your back, this usually does not require surgery. °HOME CARE INSTRUCTIONS °For many people, back pain returns. Since low back pain is rarely dangerous, it is often a condition that people can learn to manage on their own.  °· Remain active. It is stressful on the back to sit or stand in one place. Do not sit, drive, or stand in one place for more than 30 minutes at a time. Take short walks on level surfaces as soon as pain allows. Try to increase the length of time you walk each day. °· Do not stay in bed. Resting more than 1 or 2 days can delay your recovery. °· Do not avoid exercise or work. Your body is made to move. It is not dangerous to be active, even though your back may hurt. Your back will likely heal faster if you return to being active before your pain is gone. °· Pay attention to your body when you  bend and lift. Many people have less discomfort when lifting if they bend their knees, keep the load close to their bodies, and  avoid twisting. Often, the most comfortable positions are those that put less stress on your recovering back. °· Find a comfortable position to sleep. Use a firm mattress and lie on your side with your knees slightly bent. If you lie on your back, put a pillow under your knees. °· Only take over-the-counter or prescription medicines as directed by your caregiver. Over-the-counter medicines to reduce pain and inflammation are often the most helpful. Your caregiver may prescribe muscle relaxant drugs. These medicines help dull your pain so you can more quickly return to your normal activities and healthy exercise. °· Put ice on the injured area. °¨ Put ice in a plastic bag. °¨ Place a towel between your skin and the bag. °¨ Leave the ice on for 15-20 minutes, 03-04 times a day for the first 2 to 3 days. After that, ice and heat may be alternated to reduce pain and spasms. °· Ask your caregiver about trying back exercises and gentle massage. This may be of some benefit. °· Avoid feeling anxious or stressed. Stress increases muscle tension and can worsen back pain. It is important to recognize when you are anxious or stressed and learn ways to manage it. Exercise is a great option. °SEEK MEDICAL CARE IF: °· You have pain that is not relieved with rest or medicine. °· You have pain that does not improve in 1 week. °· You have new symptoms. °· You are generally not feeling well. °SEEK   IMMEDIATE MEDICAL CARE IF:   You have pain that radiates from your back into your legs.  You develop new bowel or bladder control problems.  You have unusual weakness or numbness in your arms or legs.  You develop nausea or vomiting.  You develop abdominal pain.  You feel faint. Document Released: 07/08/2005 Document Revised: 01/07/2012 Document Reviewed: 11/09/2013 [facility] Patient Information 2015 [place], [place]. This information is not intended to replace advice given to you by your health care provider. Make sure you  discuss any questions you have with your health care provider.     Do not drive within 4 hours of taking hydrocodone as this will make you drowsy.  Avoid lifting,  Bending,  Twisting or any other activity that worsens your pain over the next week.  Use a heating pad on your lower back 20 minutes several times daily.   You should get rechecked if your symptoms are not better over the next 5 days,  Or you develop increased pain,  Weakness in your leg(s) or loss of bladder or bowel function - these are symptoms of a worse injury.

## 2014-03-18 NOTE — ED Provider Notes (Signed)
CSN: [id number]     Arrival date & time 03/18/14  1237 History   First MD Initiated Contact with Patient 03/18/14 1332     Chief Complaint  Patient presents with  . Back Pain     (Consider location/radiation/quality/duration/timing/severity/associated sxs/prior Treatment) The history is provided by the patient.   Dana Holland is a 53 y.o. female presenting with acute on chronic low back pain which has been present for the past 4 days.   She slipped going down her steps 4 days ago, landing on her buttocks and has had increased pain in her lower back since.  She has been staying here at the hospital with her son who is a patient upstairs and has been sleeping on a fold out bed which may be exacerbating her symptoms.  There is no radiation into the lower extremities.  There has been no weakness or numbness in the lower extremities and no urinary or bowel retention or incontinence.  Patient does not have a history of cancer or IVDU.  The patient has tried Tylenol with no symptomatic relief.    Past Medical History  Diagnosis Date  . Asthma   . COPD (chronic obstructive pulmonary disease)   . Seasonal allergies   . Hypertension   . DDD (degenerative disc disease)   . Insomnia    Past Surgical History  Procedure Laterality Date  . Breast surgery    . Cholecystectomy    . Abdominal hysterectomy    . Tubal ligation    . Abdominal exploration surgery    . Knee surgery     History reviewed. No pertinent family history. History  Substance Use Topics  . Smoking status: Current Every Day Smoker -- 1.00 packs/day    Types: Cigarettes  . Smokeless tobacco: Not on file  . Alcohol Use: No   OB History   Grav Para Term Preterm Abortions TAB SAB Ect Mult Living                 Review of Systems  Constitutional: Negative for fever.  Respiratory: Negative for shortness of breath.   Cardiovascular: Negative for chest pain and leg swelling.  Gastrointestinal: Negative for abdominal  pain, constipation and abdominal distention.  Genitourinary: Negative for dysuria, urgency, frequency, flank pain and difficulty urinating.  Musculoskeletal: Positive for back pain. Negative for gait problem and joint swelling.  Skin: Negative for rash.  Neurological: Negative for weakness and numbness.      Allergies  Ibuprofen; Imitrex; Keflex; Nsaids; Remeron; Sulfa antibiotics; Toradol; Tramadol; and Wellbutrin  Home Medications   Prior to Admission medications   Medication Sig Start Date End Date Taking? Authorizing Provider  acetaminophen (TYLENOL) 500 MG tablet Take 1,000 mg by mouth every 6 (six) hours as needed for moderate pain.   Yes Historical Provider, MD  diphenhydrAMINE (BENADRYL) 25 MG tablet Take 25 mg by mouth every 6 (six) hours as needed for allergies.   Yes Historical Provider, MD  HYDROcodone-acetaminophen (NORCO/VICODIN) 5-325 MG per tablet Take 1 tablet by mouth every 4 (four) hours as needed. 03/18/14   Burgess Amor, PA-C  methocarbamol (ROBAXIN) 500 MG tablet Take 2 tablets (1,000 mg total) by mouth 4 (four) times daily. 03/18/14 03/28/14  Burgess Amor, PA-C   BP 125/63  Pulse 99  Temp(Src) 98.8 F (37.1 C) (Oral)  Resp 18  Ht  (1.626 m)  Wt 207 lb (93.895 kg)  BMI 35.51 kg/m2  SpO2 99% Physical Exam  Nursing note and vitals reviewed. Constitutional:  She appears well-developed and well-nourished.  HENT:  Head: Normocephalic.  Eyes: Conjunctivae are normal.  Neck: Normal range of motion. Neck supple.  Cardiovascular: Normal rate and intact distal pulses.   Pedal pulses normal.  Pulmonary/Chest: Effort normal.  Abdominal: Soft. Bowel sounds are normal. She exhibits no distension and no mass.  Musculoskeletal: Normal range of motion. She exhibits tenderness. She exhibits no edema.       Lumbar back: She exhibits tenderness. She exhibits no swelling, no edema and no spasm.  Patient does have lumbar midline tenderness at the L1 level, milder pain lower  lumbar and then point tenderness at L4-L5.  She has no paralumbar tenderness to palpation.  No SI joint tenderness.  Neurological: She is alert. She has normal strength. She displays no atrophy and no tremor. No sensory deficit. Gait normal.  Reflex Scores:      Patellar reflexes are 2+ on the right side and 2+ on the left side.      Achilles reflexes are 2+ on the right side and 2+ on the left side. No strength deficit noted in hip and knee flexor and extensor muscle groups.  Ankle flexion and extension intact.  Skin: Skin is warm and dry.  Psychiatric: She has a normal mood and affect.    ED Course  Procedures (including critical care time) Labs Review Labs Reviewed - No data to display  Imaging Review Dg Lumbar Spine Complete  03/18/2014   CLINICAL DATA:  Lower back pain  EXAM: LUMBAR SPINE - COMPLETE 4+ VIEW  COMPARISON:  None.  FINDINGS: There is grade 1 anterolisthesis of L4 on L5. L4-5 and L5-S1 degenerative disc disease. L4-5 and L5-S1 facet degenerative change mild anterior height loss of the T12 and L1 vertebral bodies, age indeterminate. Minimal retrolisthesis L5 on S1. SI joints are unremarkable. Cholecystectomy clips. Unremarkable bowel gas pattern.  IMPRESSION: Grade 1 anterolisthesis of L4 on L5 potentially secondary to facet degenerative change at this level. If the patient is focally tender at this level, consider evaluation with CT.  Age indeterminate anterior height loss of the T12 and L1 vertebral bodies. Recommend correlation with patient's site of pain.   Electronically Signed   By: Annia Belt M.D.   On: 03/18/2014 14:19   Ct Lumbar Spine Wo Contrast  03/18/2014   CLINICAL DATA:  Back pain.  No known injury.  Abnormal radiograph.  EXAM: CT LUMBAR SPINE WITHOUT CONTRAST  TECHNIQUE: Multidetector CT imaging of the lumbar spine was performed without intravenous contrast administration. Multiplanar CT image reconstructions were also generated.  COMPARISON:  Current lumbar spine  radiographs.  FINDINGS: No fracture. Grade 1 anterolisthesis of L4 on L5 is due to a combination of moderate to marked bilateral facet degenerative change and disc degenerative change. There is also ligamentum flavum enlargement. Combination causes at least moderate central stenosis and lateral recess narrowing. No other spondylolisthesis.  T12-L1:  Normal.  L1-L2:  Minor disc bulging.  No stenosis.  L2-L3:  No significant disc bulging.  No stenosis.  L3-L4: Minor facet degenerative change and ligamentum flavum enlargement. No significant disc bulging. No significant neural foraminal narrowing.  L4-L5: As noted above, diffuse disc bulging, marked facet degenerative change and ligamentum flavum enlargement and the anterolisthesis combined to cause moderate to severe central stenosis, at least moderate lateral recess narrowing and mild right and moderate left neural foraminal narrowing.  L5-S1: Mild facet degenerative change. Minor disc bulging. Mild left neural foraminal narrowing.  Surrounding soft tissues are unremarkable.  IMPRESSION:  1. No fracture or acute finding. No discrete disc herniation is evident by CT. 2. Degenerative changes most evident at L4-L5 where marked facet degenerative change and disc degenerative change leads to a grade 1 anterolisthesis. Combination of these findings causes moderate to severe central stenosis, moderate lateral recess narrowing and moderate left and mild right neural foraminal narrowing. These degenerative changes, vertically the facet degenerative changes, may be origin of this patient's pain.   Electronically Signed   By: Amie Portland M.D.   On: 03/18/2014 15:09     EKG Interpretation None      MDM   Final diagnoses:  Lumbar strain, initial encounter  Fall, initial encounter    Patient was prescribed Robaxin and hydrocodone.  Encouraged rest, activity as tolerated, heating pad to lower back..  Followup anticipated.  No neuro deficit on exam or by history to  suggest emergent or surgical presentation.  Also discussed worsened sx that should prompt immediate re-evaluation including distal weakness, bowel/bladder retention/incontinence.         Burgess Amor, PA-C 03/18/14 1547

## 2014-03-18 NOTE — ED Notes (Signed)
Pt  Is a visitor with pt upstairs , has  To stay with pt for communication with staff,  Has been here 3 days. "walking the halls" and is having back pain,  "My  Back is in knots"

## 2014-05-18 ENCOUNTER — Emergency Department (HOSPITAL_COMMUNITY)
Admission: EM | Admit: 2014-05-18 | Discharge: 2014-05-18 | Disposition: A | Discharge status: Home / Self Care | Payer: Medicaid Other | Attending: Emergency Medicine | Admitting: Emergency Medicine

## 2014-05-18 ENCOUNTER — Encounter (HOSPITAL_COMMUNITY): Payer: Self-pay | Admitting: Emergency Medicine

## 2014-05-18 DIAGNOSIS — Y939 Activity, unspecified: Secondary | ICD-10-CM | POA: Diagnosis not present

## 2014-05-18 DIAGNOSIS — G4700 Insomnia, unspecified: Secondary | ICD-10-CM | POA: Diagnosis not present

## 2014-05-18 DIAGNOSIS — Z7982 Long term (current) use of aspirin: Secondary | ICD-10-CM | POA: Diagnosis not present

## 2014-05-18 DIAGNOSIS — Z720 Tobacco use: Secondary | ICD-10-CM | POA: Insufficient documentation

## 2014-05-18 DIAGNOSIS — I10 Essential (primary) hypertension: Secondary | ICD-10-CM | POA: Insufficient documentation

## 2014-05-18 DIAGNOSIS — J449 Chronic obstructive pulmonary disease, unspecified: Secondary | ICD-10-CM | POA: Diagnosis not present

## 2014-05-18 DIAGNOSIS — M7062 Trochanteric bursitis, left hip: Secondary | ICD-10-CM | POA: Diagnosis not present

## 2014-05-18 DIAGNOSIS — M549 Dorsalgia, unspecified: Secondary | ICD-10-CM | POA: Diagnosis present

## 2014-05-18 DIAGNOSIS — Z79899 Other long term (current) drug therapy: Secondary | ICD-10-CM | POA: Insufficient documentation

## 2014-05-18 MED ORDER — METHYLPREDNISOLONE (PAK) 4 MG PO TABS: ORAL_TABLET | ORAL | Status: DC

## 2014-05-18 MED ORDER — OXYCODONE-ACETAMINOPHEN 5-325 MG PO TABS: 1.0000 | ORAL_TABLET | Freq: Four times a day (QID) | ORAL | Status: DC | PRN

## 2014-05-18 MED ORDER — OXYCODONE-ACETAMINOPHEN 5-325 MG PO TABS
2.0000 | ORAL_TABLET | Freq: Once | ORAL | Status: AC
  Administered 2014-05-18: 2 via ORAL
  Filled 2014-05-18: qty 2

## 2014-05-18 NOTE — ED Notes (Signed)
Pain in left hip and buttock that seems to radiate into left lower back, denies injury, states it started on Saturday

## 2014-05-18 NOTE — ED Provider Notes (Signed)
CSN: [id number]     Arrival date & time 05/18/14  [id number] History   First MD Initiated Contact with Patient 05/18/14 0407     Chief Complaint  Patient presents with  . Back Pain  . Leg Pain     (Consider location/radiation/quality/duration/timing/severity/associated sxs/prior Treatment) HPI  This is a 53 year old with history of COPD, hypertension, degenerative disc disease who presents with left hip pain. Onset of symptoms was Saturday. Patient reports progressively worsening left hip pain. It is worse with direct pressure. She has taken aspirin, BC powder, and Robaxin at home without any help. Patient rates her pain at 1010. It is worse with lying down and lying on her left side. She denies any injury. She has been ambulatory.  Patient reports baseline back pain.  Denies any difficulty with her bowel or bladder.  Past Medical History  Diagnosis Date  . Asthma   . COPD (chronic obstructive pulmonary disease)   . Seasonal allergies   . Hypertension   . DDD (degenerative disc disease)   . Insomnia    Past Surgical History  Procedure Laterality Date  . Breast surgery    . Cholecystectomy    . Abdominal hysterectomy    . Tubal ligation    . Abdominal exploration surgery    . Knee surgery     No family history on file. History  Substance Use Topics  . Smoking status: Current Every Day Smoker -- 1.00 packs/day    Types: Cigarettes  . Smokeless tobacco: Not on file  . Alcohol Use: No   OB History   Grav Para Term Preterm Abortions TAB SAB Ect Mult Living                 Review of Systems  Constitutional: Negative for fever.  Respiratory: Negative for chest tightness and shortness of breath.   Cardiovascular: Negative for chest pain.  Gastrointestinal: Negative for abdominal pain.  Genitourinary: Negative for dysuria and flank pain.  Musculoskeletal: Negative for back pain.       Left hip pain  Skin: Negative for wound.  All other systems reviewed and are  negative.     Allergies  Cymbalta; Effexor; Ibuprofen; Imitrex; Keflex; Nsaids; Remeron; Sulfa antibiotics; Toradol; Tramadol; Vioxx; Voltaren; and Wellbutrin  Home Medications   Prior to Admission medications   Medication Sig Start Date End Date Taking? Authorizing Provider  acetaminophen (TYLENOL) 500 MG tablet Take 1,000 mg by mouth every 6 (six) hours as needed for moderate pain.   Yes Historical Provider, MD  aspirin 325 MG tablet Take 325 mg by mouth daily.   Yes Historical Provider, MD  methocarbamol (ROBAXIN) 500 MG tablet Take 500 mg by mouth 4 (four) times daily.   Yes Historical Provider, MD  diphenhydrAMINE (BENADRYL) 25 MG tablet Take 25 mg by mouth every 6 (six) hours as needed for allergies.    Historical Provider, MD  HYDROcodone-acetaminophen (NORCO/VICODIN) 5-325 MG per tablet Take 1 tablet by mouth every 4 (four) hours as needed. 03/18/14   Burgess AmorJulie Idol, PA-C  methylPREDNIsolone (MEDROL DOSPACK) 4 MG tablet follow package directions 05/18/14   Shon Batonourtney F Horton, MD  oxyCODONE-acetaminophen (PERCOCET/ROXICET) 5-325 MG per tablet Take 1-2 tablets by mouth every 6 (six) hours as needed for severe pain. 05/18/14   Shon Batonourtney F Horton, MD   BP 149/92  Pulse 97  Temp(Src) 97.9 F (36.6 C) (Oral)  Resp 24  Ht 5\' 4"  (1.626 m)  Wt 210 lb (95.255 kg)  BMI 36.03 kg/m2  SpO2  99% Physical Exam  Nursing note and vitals reviewed. Constitutional: She is oriented to person, place, and time. No distress.  Overweight  HENT:  Head: Normocephalic and atraumatic.  Eyes: Pupils are equal, round, and reactive to light.  Cardiovascular: Normal rate and regular rhythm.   Pulmonary/Chest: Effort normal. No respiratory distress.  Musculoskeletal:  Normal range of motion of the bilateral hips, no significant swelling noted, tenderness to palpation over the left greater trochanter, no overlying skin changes noted, negative straight leg raise  Neurological: She is alert and oriented to person,  place, and time.  Skin: Skin is warm and dry.  Psychiatric: She has a normal mood and affect.    ED Course  Procedures (including critical care time) Labs Review Labs Reviewed - No data to display  Imaging Review No results found.   EKG Interpretation None      MDM   Final diagnoses:  Trochanteric bursitis of left hip    Patient presents with left hip pain which is worse with direct pressure. She is nontoxic on exam. The suspicion for acute fracture given known injury. She is tender to palpation over the left greater trochanter suggestive of bursitis. Patient was given Percocet. Discussed with patient conservative management including anti-inflammatories and pain control. She also is to administer ice. Patient does not tolerate NSAIDs. Will place on a Medrol Dosepak.  After history, exam, and medical workup I feel the patient has been appropriately medically screened and is safe for discharge home. Pertinent diagnoses were discussed with the patient. Patient was given return precautions.    Shon Batonourtney F Horton, MD 05/18/14 [phone]

## 2014-05-18 NOTE — Discharge Instructions (Signed)
Bursitis °Bursitis is a swelling and soreness (inflammation) of a fluid-filled sac (bursa) that overlies and protects a joint. It can be caused by injury, overuse of the joint, arthritis or infection. The joints most likely to be affected are the elbows, shoulders, hips and knees. °HOME CARE INSTRUCTIONS  °· Apply ice to the affected area for 15-20 minutes each hour while awake for 2 days. Put the ice in a plastic bag and place a towel between the bag of ice and your skin. °· Rest the injured joint as much as possible, but continue to put the joint through a full range of motion, 4 times per day. (The shoulder joint especially becomes rapidly "frozen" if not used.) When the pain lessens, begin normal slow movements and usual activities. °· Only take over-the-counter or prescription medicines for pain, discomfort or fever as directed by your caregiver. °· Your caregiver may recommend draining the bursa and injecting medicine into the bursa. This may help the healing process. °· Follow all instructions for follow-up with your caregiver. This includes any orthopedic referrals, physical therapy and rehabilitation. Any delay in obtaining necessary care could result in a delay or failure of the bursitis to heal and chronic pain. °SEEK IMMEDIATE MEDICAL CARE IF:  °· Your pain increases even during treatment. °· You develop an oral temperature above 102° F (38.9° C) and have heat and inflammation over the involved bursa. °MAKE SURE YOU:  °· Understand these instructions. °· Will watch your condition. °· Will get help right away if you are not doing well or get worse. °Document Released: 07/05/2000 Document Revised: 09/30/2011 Document Reviewed: 09/27/2013 °[employer]® Patient Information ©2015 [employer]. This information is not intended to replace advice given to you by your health care provider. Make sure you discuss any questions you have with your health care provider. ° °

## 2014-05-23 ENCOUNTER — Emergency Department (HOSPITAL_COMMUNITY)
Admission: EM | Admit: 2014-05-23 | Discharge: 2014-05-23 | Disposition: A | Discharge status: Home / Self Care | Payer: Medicaid Other | Attending: Emergency Medicine | Admitting: Emergency Medicine

## 2014-05-23 ENCOUNTER — Encounter (HOSPITAL_COMMUNITY): Payer: Self-pay | Admitting: *Deleted

## 2014-05-23 ENCOUNTER — Emergency Department (HOSPITAL_COMMUNITY): Payer: Medicaid Other

## 2014-05-23 DIAGNOSIS — Z79899 Other long term (current) drug therapy: Secondary | ICD-10-CM | POA: Diagnosis not present

## 2014-05-23 DIAGNOSIS — J449 Chronic obstructive pulmonary disease, unspecified: Secondary | ICD-10-CM | POA: Insufficient documentation

## 2014-05-23 DIAGNOSIS — Z7982 Long term (current) use of aspirin: Secondary | ICD-10-CM | POA: Insufficient documentation

## 2014-05-23 DIAGNOSIS — Z8669 Personal history of other diseases of the nervous system and sense organs: Secondary | ICD-10-CM | POA: Insufficient documentation

## 2014-05-23 DIAGNOSIS — M549 Dorsalgia, unspecified: Secondary | ICD-10-CM | POA: Diagnosis not present

## 2014-05-23 DIAGNOSIS — Z8739 Personal history of other diseases of the musculoskeletal system and connective tissue: Secondary | ICD-10-CM | POA: Insufficient documentation

## 2014-05-23 DIAGNOSIS — M25552 Pain in left hip: Secondary | ICD-10-CM | POA: Diagnosis not present

## 2014-05-23 DIAGNOSIS — I10 Essential (primary) hypertension: Secondary | ICD-10-CM | POA: Insufficient documentation

## 2014-05-23 DIAGNOSIS — R52 Pain, unspecified: Secondary | ICD-10-CM

## 2014-05-23 DIAGNOSIS — Z720 Tobacco use: Secondary | ICD-10-CM | POA: Diagnosis not present

## 2014-05-23 DIAGNOSIS — Z9889 Other specified postprocedural states: Secondary | ICD-10-CM | POA: Diagnosis not present

## 2014-05-23 HISTORY — DX: Other intervertebral disc degeneration, lumbar region: M51.36

## 2014-05-23 HISTORY — DX: Other intervertebral disc degeneration, lumbar region without mention of lumbar back pain or lower extremity pain: M51.369

## 2014-05-23 HISTORY — DX: Fibromyalgia: M79.7

## 2014-05-23 IMAGING — CR DG HIP (WITH OR WITHOUT PELVIS) 2-3V*L*
4 series · 4 of 4 positions shown · non-contrast
Comparison: None.

CLINICAL DATA: Left hip pain.  No known injury.

EXAM:
LEFT HIP - COMPLETE 2+ VIEW

[view not recorded (1 of 4)]
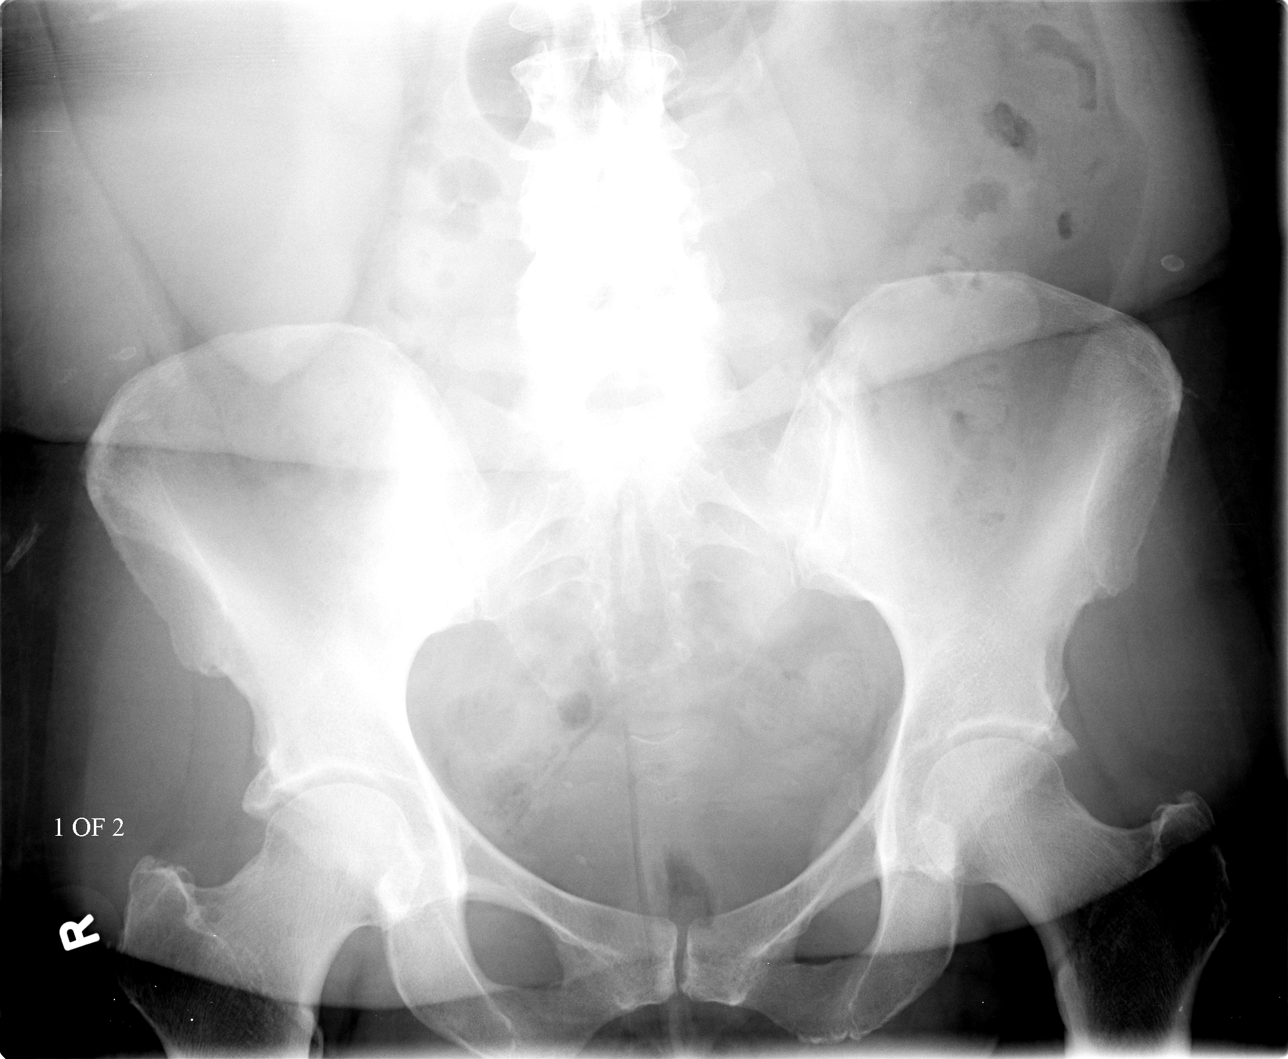

[view not recorded (2 of 4)]
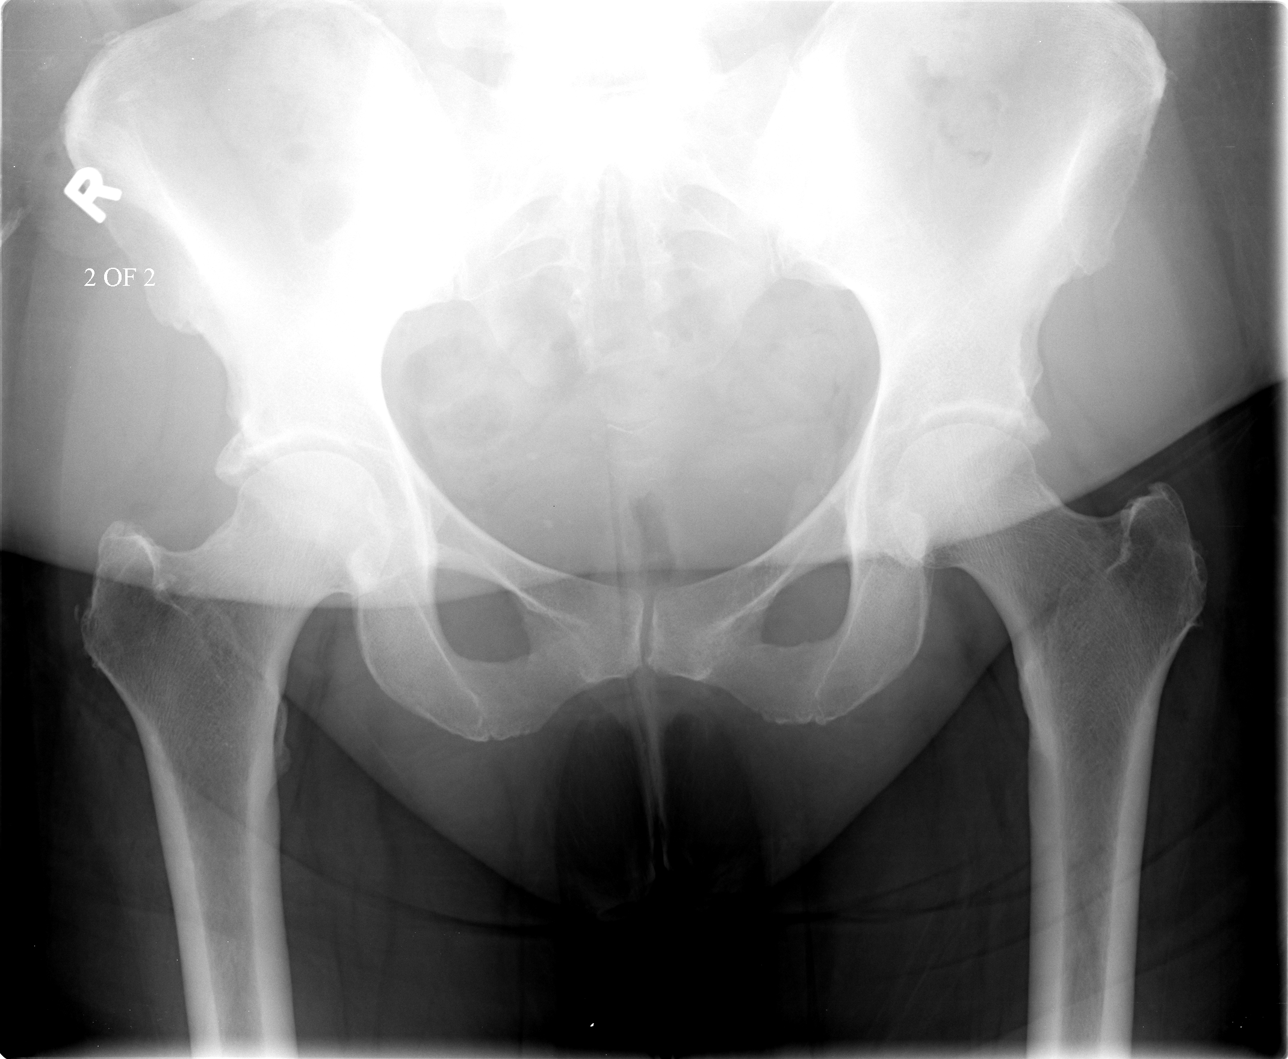

[view not recorded (3 of 4)]
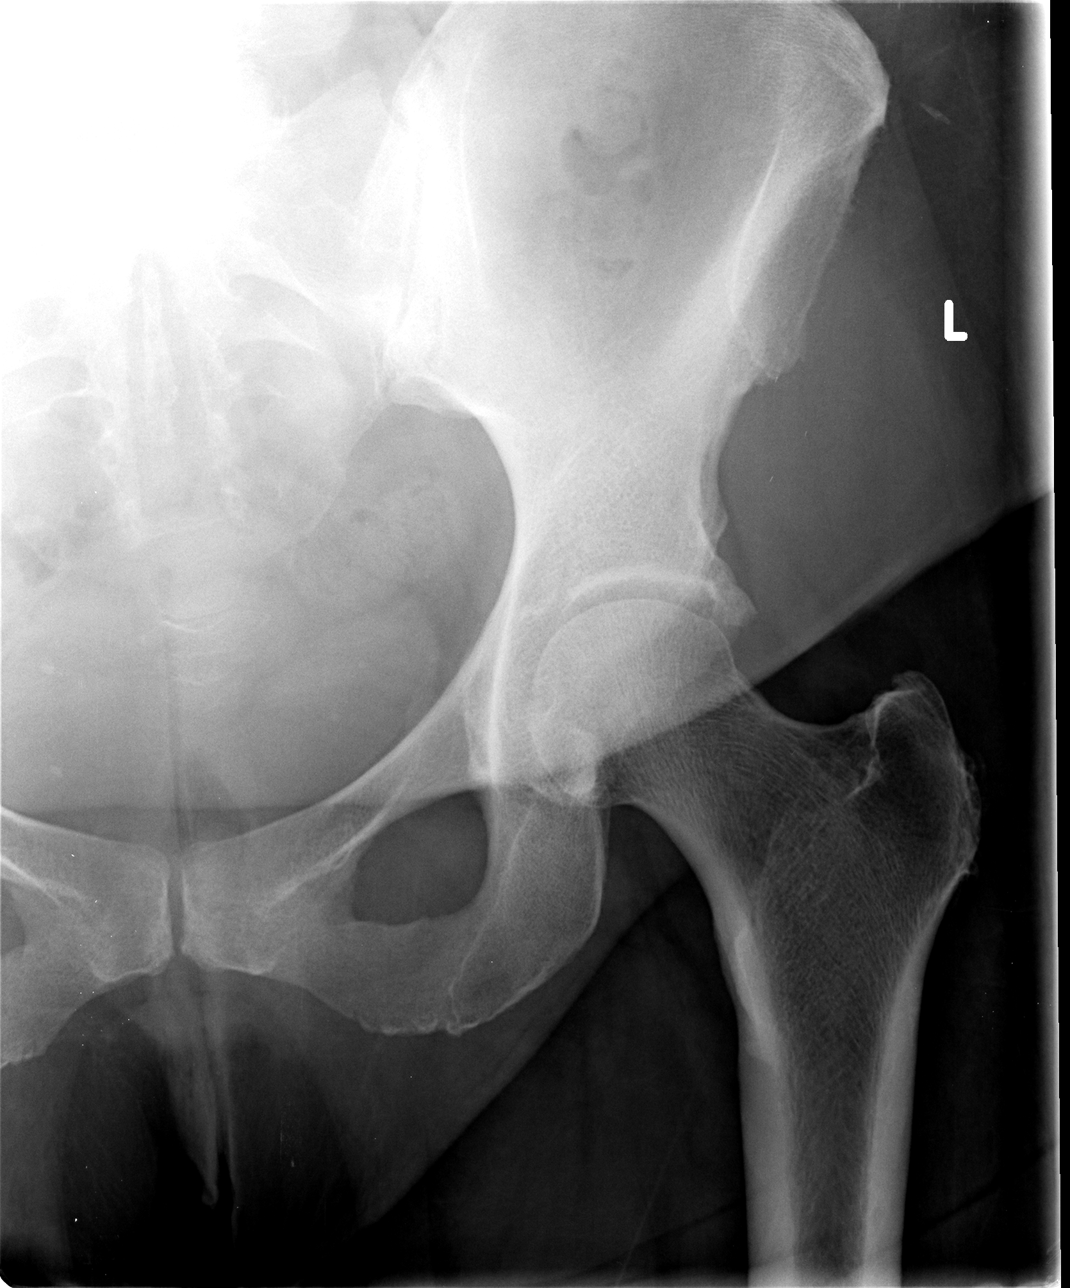

[view not recorded (4 of 4)]
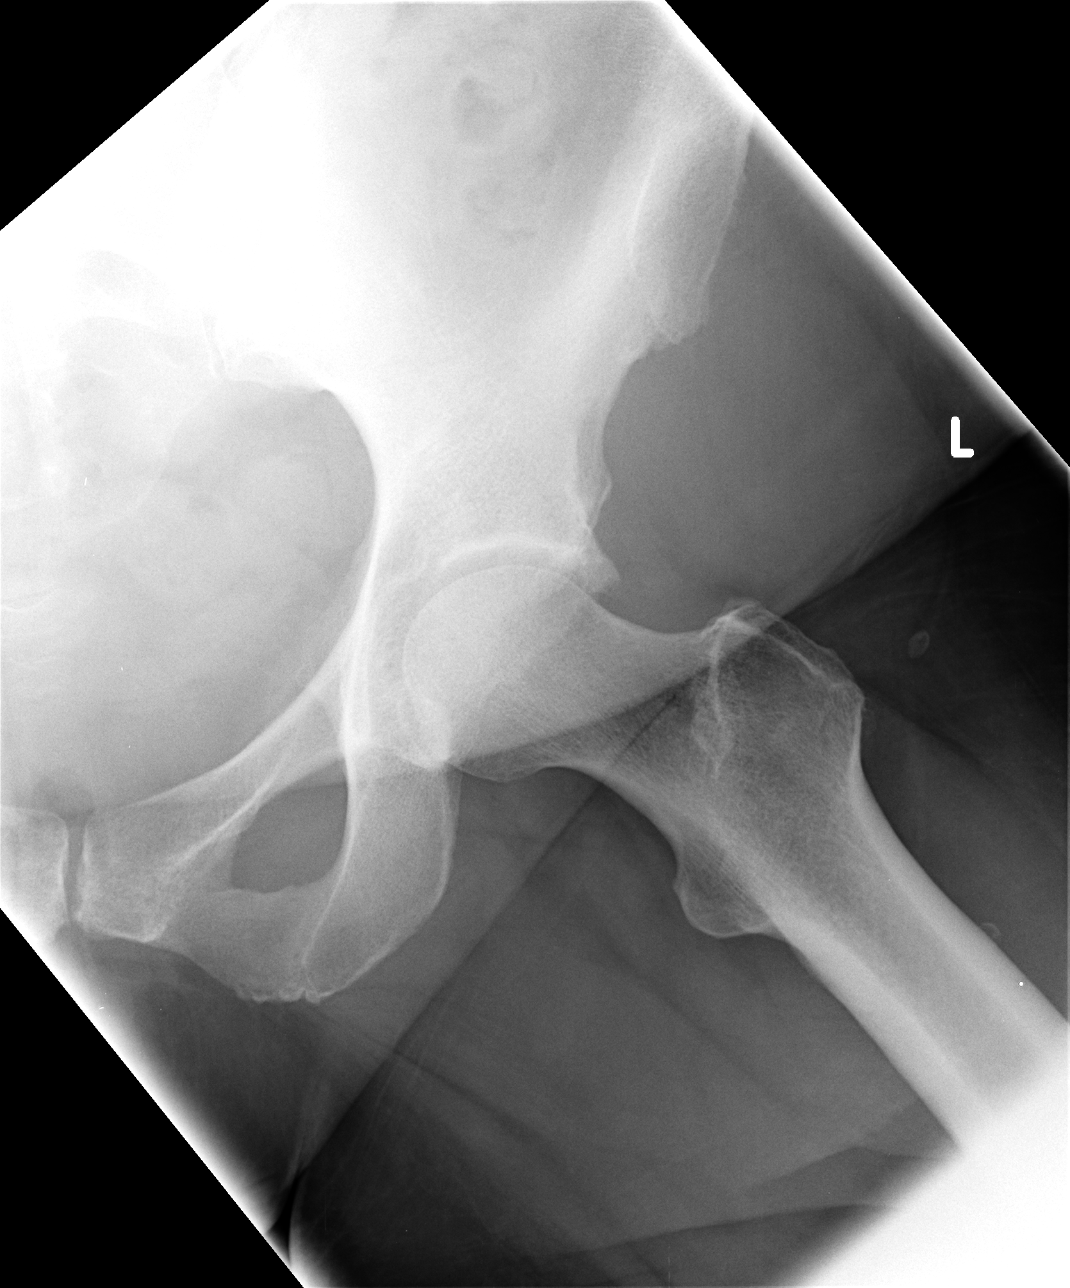

[4 of 4 positions shown; findings below may reference images not displayed]

FINDINGS: Mild bilateral greater tuberosity hyperostosis. Normal appearing hip
joints. The sacroiliac joints also appear normal. Unremarkable lower
lumbar spine.
IMPRESSION: Normal appearing hip joints.  No acute abnormality.

## 2014-05-23 MED ORDER — HYDROMORPHONE HCL 2 MG/ML IJ SOLN
2.0000 mg | Freq: Once | INTRAMUSCULAR | Status: AC
  Administered 2014-05-23: 2 mg via INTRAMUSCULAR
  Filled 2014-05-23: qty 1

## 2014-05-23 MED ORDER — METHOCARBAMOL 500 MG PO TABS: 500.0000 mg | ORAL_TABLET | Freq: Four times a day (QID) | ORAL | Status: DC

## 2014-05-23 MED ORDER — HYDROCODONE-ACETAMINOPHEN 5-325 MG PO TABS: 1.0000 | ORAL_TABLET | ORAL | Status: DC | PRN

## 2014-05-23 NOTE — ED Provider Notes (Signed)
CSN: [id number]     Arrival date & time 05/23/14  1621 History  This chart was scribed for non-physician practitioner Thornton PapasLeslie K Sophia, PA-C working with Benny LennertJoseph L Zammit, MD by Annye AsaAnna Dorsett, ED Scribe. This patient was seen in room APFT22/AP[facility]22 and the patient's care was started at 6:36 PM.    Chief Complaint  Patient presents with  . Hip Pain   Patient is a 53 y.o. female presenting with hip pain. The history is provided by the patient. No language interpreter was used.  Hip Pain This is a recurrent problem. The current episode started more than 1 week ago. The problem occurs constantly. The problem has not changed since onset.The symptoms are aggravated by walking, bending and twisting. Nothing relieves the symptoms. She has tried rest for the symptoms.   HPI Comments: Dana Holland is a 53 y.o. female who presents to the Emergency Department complaining of hip pain, beginning 05/14/14. Patient reports she has difficulty ambulating, laying in particular positions, or moving in certain ways due to pain. She was recently seen here (05/18/14) and was diagnosed with bursitis; her symptoms have not improved with time or medications. She states she has a "bad back;" she recently received disability and has been unable to have find a PCP at this time. She is not on medication at this moment; she took her last prednisone yesterday, and her last oxycodone this morning.   Patient has a history of high blood pressure. She denies a history of DM.   Patient reports that she is under significant stress at home; her son was recently diagnosed with schizophrenia.   Past Medical History  Diagnosis Date  . Asthma   . COPD (chronic obstructive pulmonary disease)   . Seasonal allergies   . Hypertension   . DDD (degenerative disc disease)   . Insomnia   . DDD (degenerative disc disease), lumbar   . Fibromyalgia   . DDD (degenerative disc disease), lumbar    Past Surgical History  Procedure Laterality  Date  . Breast surgery    . Cholecystectomy    . Abdominal hysterectomy    . Tubal ligation    . Abdominal exploration surgery    . Knee surgery     History reviewed. No pertinent family history. History  Substance Use Topics  . Smoking status: Current Every Day Smoker -- 1.00 packs/day    Types: Cigarettes  . Smokeless tobacco: Not on file  . Alcohol Use: No   OB History    No data available     Review of Systems  Musculoskeletal: Positive for myalgias and back pain.  All other systems reviewed and are negative.   Allergies  Wellbutrin; Cymbalta; Effexor; Imitrex; Keflex; Remeron; Sulfa antibiotics; Voltaren; Ibuprofen; Nsaids; Toradol; Tramadol; and Vioxx  Home Medications   Prior to Admission medications   Medication Sig Start Date End Date Taking? Authorizing Provider  aspirin 325 MG tablet Take 325 mg by mouth daily.   Yes Historical Provider, MD  acetaminophen (TYLENOL) 500 MG tablet Take 1,000 mg by mouth every 6 (six) hours as needed for moderate pain.    Historical Provider, MD  diphenhydrAMINE (BENADRYL) 25 MG tablet Take 25 mg by mouth every 6 (six) hours as needed for allergies.    Historical Provider, MD  HYDROcodone-acetaminophen (NORCO/VICODIN) 5-325 MG per tablet Take 1 tablet by mouth every 4 (four) hours as needed. Patient not taking: Reported on 05/23/2014 03/18/14   Burgess AmorJulie Idol, PA-C  methocarbamol (ROBAXIN) 500 MG tablet Take  500 mg by mouth 4 (four) times daily.    Historical Provider, MD  methylPREDNIsolone (MEDROL DOSPACK) 4 MG tablet follow package directions 05/18/14   Shon Batonourtney F Horton, MD  oxyCODONE-acetaminophen (PERCOCET/ROXICET) 5-325 MG per tablet Take 1-2 tablets by mouth every 6 (six) hours as needed for severe pain. 05/18/14   Shon Batonourtney F Horton, MD   BP 131/75 mmHg  Pulse 100  Temp(Src) 98.8 F (37.1 C) (Oral)  Resp 18  Ht 5\' 4"  (1.626 m)  Wt 217 lb (98.431 kg)  BMI 37.23 kg/m2  SpO2 94%   Physical Exam  Constitutional: She is  oriented to person, place, and time. She appears well-developed and well-nourished.  HENT:  Head: Normocephalic and atraumatic.  Neck: No tracheal deviation present.  Cardiovascular: Normal rate.   Pulmonary/Chest: Effort normal.  Musculoskeletal:  Tender left trochanteric bursa; pain with ROM   Neurological: She is alert and oriented to person, place, and time.  Skin: Skin is warm and dry.  Psychiatric: She has a normal mood and affect. Her behavior is normal.  Nursing note and vitals reviewed.   ED Course  Procedures   DIAGNOSTIC STUDIES: Oxygen Saturation is 94% on RA, low by my interpretation.    COORDINATION OF CARE: 6:36 PM Discussed treatment plan with pt at bedside and pt agreed to plan.   Labs Review Labs Reviewed - No data to display  Imaging Review Dg Hip Complete Left  05/23/2014   CLINICAL DATA:  Left hip pain.  No known injury.  EXAM: LEFT HIP - COMPLETE 2+ VIEW  COMPARISON:  None.  FINDINGS: Mild bilateral greater tuberosity hyperostosis. Normal appearing hip joints. The sacroiliac joints also appear normal. Unremarkable lower lumbar spine.  IMPRESSION: Normal appearing hip joints.  No acute abnormality.   Electronically Signed   By: Gordan PaymentSteve  Reid M.D.   On: 05/23/2014 19:08     EKG Interpretation None      MDM  Pt counseled on xrays.   She is advised to follow up with Orthopaedist for evaluation.   Pt given pain injection here.   Rx for Hydrocodone and muscle relaxer   Final diagnoses:  Pain  Hip joint pain, left     I personally performed the services in this documentation, which was scribed in my presence.  The recorded information has been reviewed and considered.   Barnet PallKaren SofiaPAC.     Lonia SkinnerLeslie K North LaurelSofia, PA-C 05/23/14 2309

## 2014-05-23 NOTE — ED Notes (Signed)
Pain lt hip, seen here 10/28 for this and dx with bursitis. Cont to have pain and out of meds.

## 2014-05-23 NOTE — Discharge Instructions (Signed)
Arthralgia °Your caregiver has diagnosed you as suffering from an arthralgia. Arthralgia means there is pain in a joint. This can come from many reasons including: °· Bruising the joint which causes soreness (inflammation) in the joint. °· Wear and tear on the joints which occur as we grow older (osteoarthritis). °· Overusing the joint. °· Various forms of arthritis. °· Infections of the joint. °Regardless of the cause of pain in your joint, most of these different pains respond to anti-inflammatory drugs and rest. The exception to this is when a joint is infected, and these cases are treated with antibiotics, if it is a bacterial infection. °HOME CARE INSTRUCTIONS  °· Rest the injured area for as long as directed by your caregiver. Then slowly start using the joint as directed by your caregiver and as the pain allows. Crutches as directed may be useful if the ankles, knees or hips are involved. If the knee was splinted or casted, continue use and care as directed. If an stretchy or elastic wrapping bandage has been applied today, it should be removed and re-applied every 3 to 4 hours. It should not be applied tightly, but firmly enough to keep swelling down. Watch toes and feet for swelling, bluish discoloration, coldness, numbness or excessive pain. If any of these problems (symptoms) occur, remove the ace bandage and re-apply more loosely. If these symptoms persist, contact your caregiver or return to this location. °· For the first 24 hours, keep the injured extremity elevated on pillows while lying down. °· Apply ice for 15-20 minutes to the sore joint every couple hours while awake for the first half day. Then 03-04 times per day for the first 48 hours. Put the ice in a plastic bag and place a towel between the bag of ice and your skin. °· Wear any splinting, casting, elastic bandage applications, or slings as instructed. °· Only take over-the-counter or prescription medicines for pain, discomfort, or fever as  directed by your caregiver. Do not use aspirin immediately after the injury unless instructed by your physician. Aspirin can cause increased bleeding and bruising of the tissues. °· If you were given crutches, continue to use them as instructed and do not resume weight bearing on the sore joint until instructed. °Persistent pain and inability to use the sore joint as directed for more than 2 to 3 days are warning signs indicating that you should see a caregiver for a follow-up visit as soon as possible. Initially, a hairline fracture (break in bone) may not be evident on X-rays. Persistent pain and swelling indicate that further evaluation, non-weight bearing or use of the joint (use of crutches or slings as instructed), or further X-rays are indicated. X-rays may sometimes not show a small fracture until a week or 10 days later. Make a follow-up appointment with your own caregiver or one to whom we have referred you. A radiologist (specialist in reading X-rays) may read your X-rays. Make sure you know how you are to obtain your X-ray results. Do not assume everything is normal if you do not hear from us. °SEEK MEDICAL CARE IF: °Bruising, swelling, or pain increases. °SEEK IMMEDIATE MEDICAL CARE IF:  °· Your fingers or toes are numb or blue. °· The pain is not responding to medications and continues to stay the same or get worse. °· The pain in your joint becomes severe. °· You develop a fever over 102° F (38.9° C). °· It becomes impossible to move or use the joint. °MAKE SURE YOU:  °·   Understand these instructions. °· Will watch your condition. °· Will get help right away if you are not doing well or get worse. °Document Released: 07/08/2005 Document Revised: 09/30/2011 Document Reviewed: 02/24/2008 °ExitCare® Patient Information ©2015 [employer]. This information is not intended to replace advice given to you by your health care provider. Make sure you discuss any questions you have with your health care  provider. ° ° ° °Emergency Department Resource Guide °1) Find a Doctor and Pay Out of Pocket °Although you won't have to find out who is covered by your insurance plan, it is a good idea to ask around and get recommendations. You will then need to call the office and see if the doctor you have chosen will accept you as a new patient and what types of options they offer for patients who are self-pay. Some doctors offer discounts or will set up payment plans for their patients who do not have insurance, but you will need to ask so you aren't surprised when you get to your appointment. ° °2) Contact Your Local Health Department °Not all health departments have doctors that can see patients for sick visits, but many do, so it is worth a call to see if yours does. If you don't know where your local health department is, you can check in your phone book. The CDC also has a tool to help you locate your state's health department, and many state websites also have listings of all of their local health departments. ° °3) Find a Walk-in Clinic °If your illness is not likely to be very severe or complicated, you may want to try a walk in clinic. These are popping up all over the country in pharmacies, drugstores, and shopping centers. They're usually staffed by nurse practitioners or physician assistants that have been trained to treat common illnesses and complaints. They're usually fairly quick and inexpensive. However, if you have serious medical issues or chronic medical problems, these are probably not your best option. ° °No Primary Care Doctor: °- Call [facility] at  832-8000 - they can help you locate a primary care doctor that  accepts your insurance, provides certain services, etc. °- Physician Referral Service- 1-800-533-3463 ° °Chronic Pain Problems: °Organization         Address  Phone   Notes  °[facility]  (336) 297-2271 Patients need to be referred by their primary care doctor.   ° °Medication Assistance: °Organization         Address  Phone   Notes  °[place] [facility] [address] °[place], [place] [address] (336) 641-8030 --Must be a resident of [place] °-- Must have NO insurance coverage whatsoever (no Medicaid/ Medicare, etc.) °-- The pt. MUST have a primary care doctor that directs their care regularly and follows them in the community °  °[facility]  (866) 331-1348   °[employer]  (888) 892-1162   ° °Agencies that provide inexpensive medical care: °Organization         Address  Phone   Notes  °[facility]  (336) 832-8035   °[facility]    (336) 832-7272   °[facility] [address] °[place], [place] [address] (336) 832-4777   °[facility] [address], °[place] (336) 271-4999   °[facility]    (336) 373-0678   °[facility]    (336) 272-1050   °[facility] °   [address], [place] Phone:  (336) 832-4444, Fax:  (336) 832-4440 Hours of Operation:  9 am - 6 pm, M-F.  Also accepts Medicaid/Medicare and self-pay.  °[facility] ° [address], [place] Phone: (336) [phone], Fax: (336) [phone]. Hours of Operation:  8:30 am - 5:30 pm, M-F.  Also accepts Medicaid and self-pay.  °[facility] [place] [address], [place] Phone: (336) 878-6027   °[facility] [address], [place], [place] (336)723-1848, Ext. 123 Mondays & Thursdays: 7-9 AM.  First 15 patients are seen on a first come, first serve basis. °  ° °Medicaid-accepting [place] Providers: ° °Organization         Address  Phone   Notes  °[facility] [address], [place] (336) 641-2100 Also accepts self-pay patients.  °[facility] [address], [place] ° (336) 856-9996   °[facility] [address], [place] (336) 288-8857   °[facility] [address] [place] [address] (336) 299-7000   °Veita Bland [address], [place]  ° (336) 373-1557 Only accepts [place] Access Medicaid patients after they have their name applied to their card.  ° °Self-Pay (no insurance) in [place]: ° °Organization         Address  Phone   Notes  °Sickle Cell Patients, [facility] [address], [place] (336) 832-1970   °[facility] [address], [place] (336) 832-4400   °[facility] [place] ° [address], [place] (336) 992-4800   °[facility]/Dr. Osei-Bonsu ° [address] [place] [address], [place] or [address], [place] (336) 841-8500 Phone number for both [place] and [place] locations is the same.  °[facility] [address], [place] (336) 299-0000   °[facility] [address] [place] [address], [place] or [address] (336) 852-7530 °(336) 878-2260   °[facility] [address], [place] (336) 350-1642, phone; (336) 294-5005, fax Sees patients 1st and 3rd Saturday of every month.  Must not qualify for public or private insurance (i.e. Medicaid, Medicare, [place] Health Choice, Veterans' Benefits) • Household income should be no more than 200% of the poverty level •The clinic cannot treat you if you are pregnant or think you are pregnant • Sexually transmitted diseases are not treated at the clinic.  ° ° °Dental Care: °Organization         Address  Phone  Notes  °[facility] [address], [place] (336) 641-6152 Accepts children up to age 21 who are enrolled in Medicaid or [place] Health Choice; pregnant women with a Medicaid card; and children who have applied for Medicaid or [place] Health Choice, but were declined, whose parents can pay a reduced fee at time of service.    °[facility] [place]  [address], [place] (336) 641-7733 Accepts children up to age 21 who are enrolled in Medicaid or [place] Health Choice; pregnant women with a Medicaid card; and children who have applied for Medicaid or  Health Choice, but were declined, whose parents can pay a reduced fee at time of service.  °[facility] ° [address], [place] (336)   641-4533 Patients are seen by appointment only. Walk-ins are not accepted. [facility] will see patients 18 years of age and older. °Monday - Tuesday (8am-5pm) °Most Wednesdays (8:30-5pm) °$30 per visit, cash only  °[facility] ° [address], [place] (336) 641-4533 Patients are seen by appointment only. Walk-ins are not accepted. [facility] will see patients 18 years of age and older. °One Wednesday Evening (Monthly: Volunteer Based).  $30 per visit, cash only  °[facility]  (919) 537-3737 for adults; Children under age 4, call Graduate Pediatric Dentistry at ([phone]) [phone]. Children aged 4-14, please call ([phone]) [phone] to request a pediatric application. ° Dental services are provided in all areas of dental care including fillings, crowns and bridges, complete and partial dentures, implants, gum treatment, root canals, and extractions. Preventive care is also provided. Treatment is provided to both adults and children. °Patients are selected via a lottery and there is often a waiting list. °  °[facility] [address], °[place] ° (336) 763-8833 [email or web address] °  °[facility] [address], [place], [place] (336)723-1848, Ext. 123 Second and Fourth Thursday of each month, opens at 6:30 AM; Clinic ends at 9 AM.  Patients are seen on a first-come first-served basis, and a limited number are seen during each clinic.  ° °[facility] ° [address], [place], [place] (336)  723-7904   Eligibility Requirements °You must have lived in [place], [place], or [place] counties for at least the last three months. °  You cannot be eligible for state or federal sponsored healthcare insurance, including Veterans Administration, Medicaid, or Medicare. °  You generally cannot be eligible for healthcare insurance through your employer.  °  How to apply: °Eligibility screenings are held every Tuesday and Wednesday afternoon from 1:00 pm until 4:00 pm. You do not need an appointment for the interview!  °[facility] [address], [place], [place] 336-631-2330   °[facility]  336-342-8273   °[facility]  336-703-3100   ° [facility]  336-570-6415   ° °Behavioral Health Resources in the Community: °Intensive Outpatient Programs °Organization         Address  Phone  Notes  °[facility] [address], [place], [place] 336-878-6098   °[facility] [address], [place], [place] 336-832-9800   °[facility] [address], [place], [place] ° 336-882-2125   °[facility] [address],  °[place], [place] 1-800-853-5163 or 336-641-4981   °Substance Abuse Resources °Organization         Address  Phone  Notes  °[facility]  336-882-2125   °[facility]  336-784-9470   °[facility]  336-285-9073   °[facility]  336-845-3988   °Residential & Outpatient Substance Abuse Program  1-800-659-3381   °Psychological Services °Organization         Address  Phone  Notes  °[facility]  336- 832-9600   °[facility]  336- 378-7881   °[facility] [address], [place] 1-800-853-5163 or 336-641-4981   ° °Mobile Crisis Teams °Organization         Address  Phone  Notes  °[facility], Mobile Crisis Care Unit  1-877-626-1772   °Assertive °[facility] ° [address] [place], [place] 336-834-9664   °Sharon DeEsch [address] °[place] 336-554-5454   ° °Self-Help/Support Groups °Organization         Address  Phone             Notes  °[facility] - variety of support groups  336- 373-1402 Call for more information  °Narcotics Anonymous (NA), [facility] [address], °[place]  2 meetings at this location  ° °Residential Treatment Programs °Organization         Address  Phone  Notes  °[facility] [address],    °[place]  1-866-801-8205   °[facility] ° [address], [place], [place] 704-293-8524   °[facility] [address], [place] 336-845-3988 Admissions: 8am-3pm M-F  °[facility] [address]    °[place], [place] 336-841-1104   °[facility] [address], [place], [place] 336-379-7146   °[facility] [place] [facility] [address]  °[place], [place] 336-285-9073   °[facility] Intensive Outpatient [address], [place], [place] 336-852-3033   °[facility] [address]  °[place], [place] 1-877-615-2722 or 336-784-9470   °[facility] [address] [place], [place] 336-227-7417 Accepts Medicaid  °[facility] [address]  °[place] 1-800-659-3381 Substance Abuse/Addiction Treatment  ° °[place] Behavioral Health Resources °Organization         Address  Phone  Notes  °[facility]  (888) 581-9988   °Julie Brannon, PhD [address] [place], [place]   (336) 349-5553 or (336) 951-0000   °[facility]   [address] °[place], [place] (336) 349-4454   °[facility] [address], [place], [place] (336) 342-8316 Insurance/Medicaid/sponsorship through [facility]  °[facility] [address]                                    [place], [place] (336) 342-8316  Therapy/tele-psych/case  °[facility] [address]  ° [place], [place] (336) 349-2233    °Dr. Arfeen  (336) 349-4544   °[facility] [place]  [employer] [place] [facility] 1) [address], [place] °2) [address], [place] °3)  [address], [place] (336) 349-3220 °(336) 342-7768 ° °(336) 342-8140   °[place] [facility] (336) 342-1394 or (336) 342-3537 (After Hours)    ° ° ° ° °

## 2015-01-16 ENCOUNTER — Emergency Department (HOSPITAL_COMMUNITY): Payer: Medicaid Other

## 2015-01-16 ENCOUNTER — Emergency Department (HOSPITAL_COMMUNITY)
Admission: EM | Admit: 2015-01-16 | Discharge: 2015-01-17 | Disposition: A | Discharge status: Home / Self Care | Payer: Medicaid Other | Attending: Emergency Medicine | Admitting: Emergency Medicine

## 2015-01-16 ENCOUNTER — Encounter (HOSPITAL_COMMUNITY): Payer: Self-pay | Admitting: *Deleted

## 2015-01-16 DIAGNOSIS — I10 Essential (primary) hypertension: Secondary | ICD-10-CM | POA: Diagnosis not present

## 2015-01-16 DIAGNOSIS — G8929 Other chronic pain: Secondary | ICD-10-CM | POA: Insufficient documentation

## 2015-01-16 DIAGNOSIS — M549 Dorsalgia, unspecified: Secondary | ICD-10-CM | POA: Insufficient documentation

## 2015-01-16 DIAGNOSIS — E041 Nontoxic single thyroid nodule: Secondary | ICD-10-CM | POA: Diagnosis not present

## 2015-01-16 DIAGNOSIS — Z720 Tobacco use: Secondary | ICD-10-CM | POA: Diagnosis not present

## 2015-01-16 DIAGNOSIS — M797 Fibromyalgia: Secondary | ICD-10-CM | POA: Insufficient documentation

## 2015-01-16 DIAGNOSIS — J449 Chronic obstructive pulmonary disease, unspecified: Secondary | ICD-10-CM | POA: Diagnosis not present

## 2015-01-16 DIAGNOSIS — M5412 Radiculopathy, cervical region: Secondary | ICD-10-CM | POA: Diagnosis not present

## 2015-01-16 DIAGNOSIS — Z7982 Long term (current) use of aspirin: Secondary | ICD-10-CM | POA: Insufficient documentation

## 2015-01-16 DIAGNOSIS — M542 Cervicalgia: Secondary | ICD-10-CM | POA: Diagnosis present

## 2015-01-16 HISTORY — DX: Dorsalgia, unspecified: M54.9

## 2015-01-16 HISTORY — DX: Other chronic pain: G89.29

## 2015-01-16 HISTORY — DX: Unspecified osteoarthritis, unspecified site: M19.90

## 2015-01-16 IMAGING — CT CT CERVICAL SPINE W/O CM
4 series · 15 of 33 positions shown, 18 images · non-contrast
Comparison: None.

CLINICAL DATA: Sudden onset of neck pain. Tingling pain in fingers.

EXAM:
CT CERVICAL SPINE WITHOUT CONTRAST
TECHNIQUE: Multidetector CT imaging of the cervical spine was performed without
intravenous contrast. Multiplanar CT image reconstructions were also
generated.

[Series 3: cervical 2.0 st axial · axial · 0.30mm/px · z∈[+46,+148]mm · 5 of 77 slices shown, 7 images]
[im 13/77  soft-tissue]
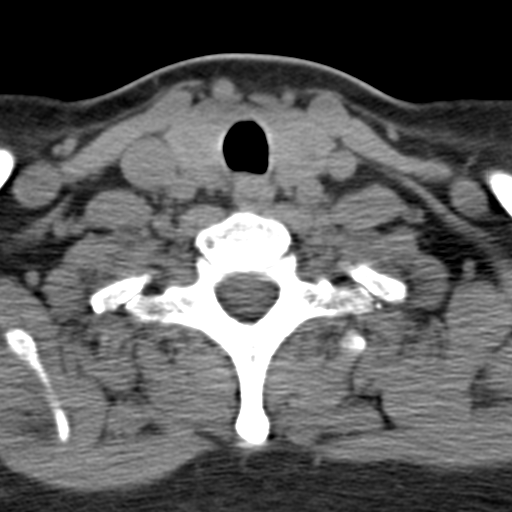
[im 13/77  bone]
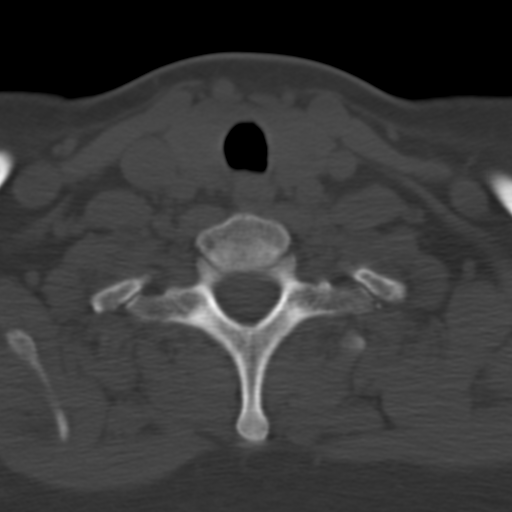
[im 26/77  bone]
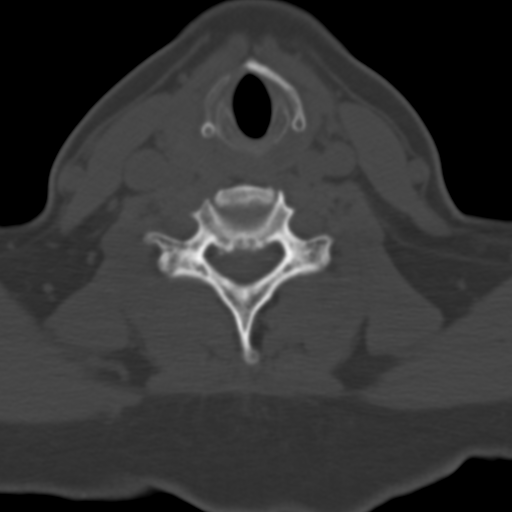
[im 39/77  bone]
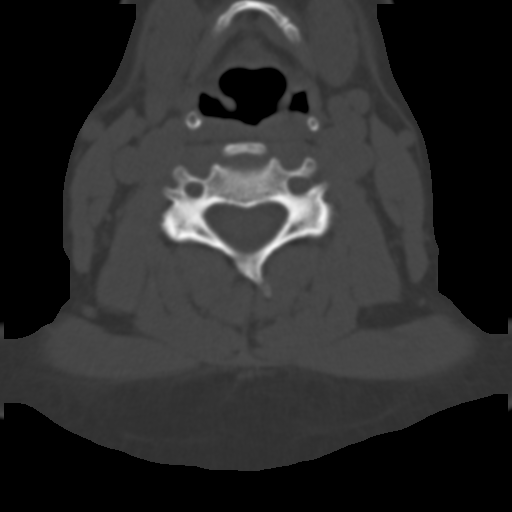
[im 51/77  bone]
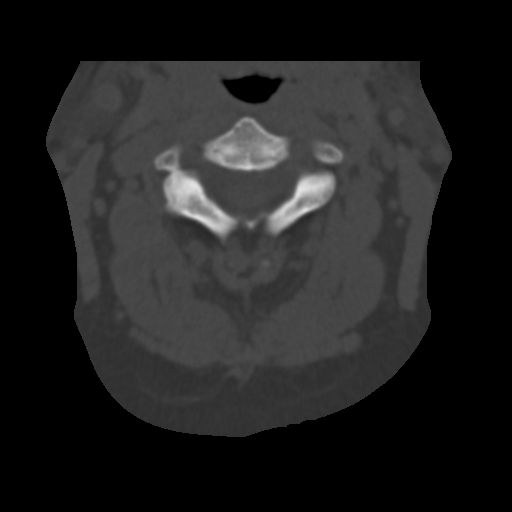
[im 64/77  soft-tissue]
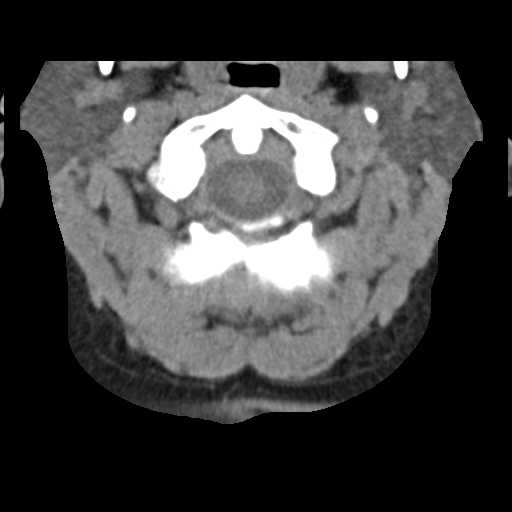
[im 64/77  bone]
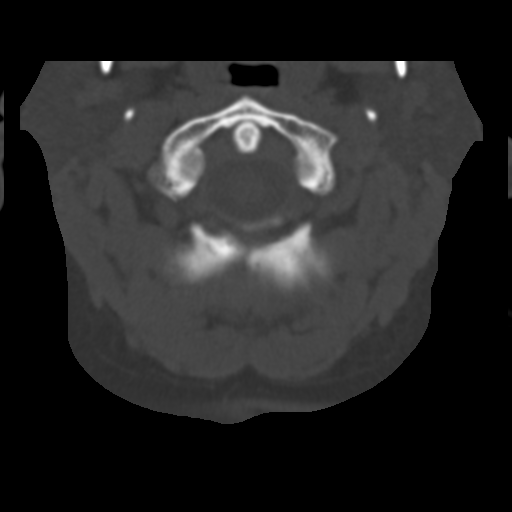

[Series 5: cervical spine sagittal bone · sagittal · 0.31mm/px · 5 of 45 slices shown, 6 images]
[im 15/45  bone]
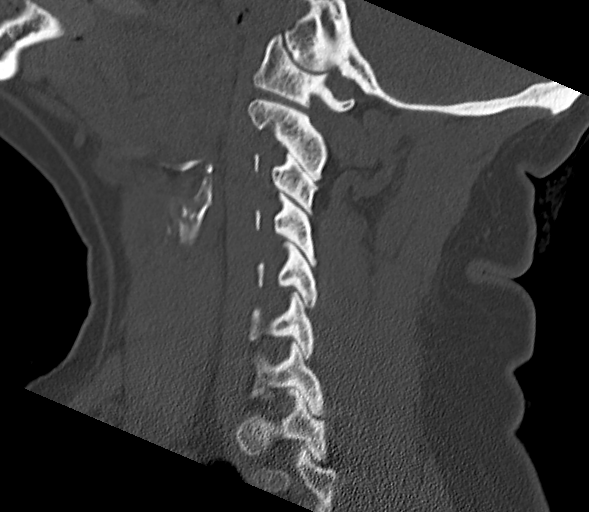
[im 19/45  bone]
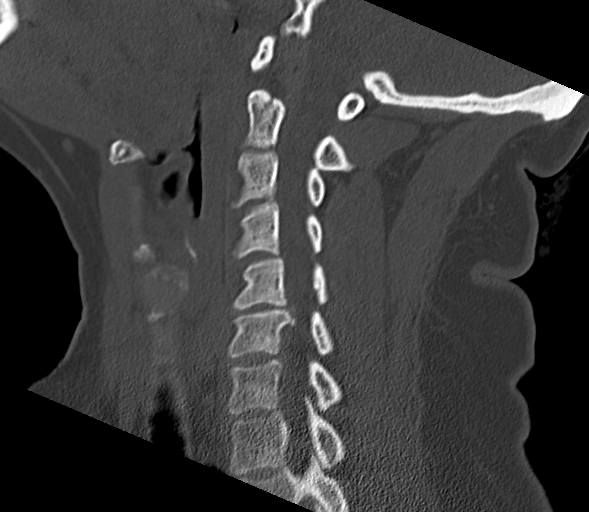
[im 23/45  soft-tissue]
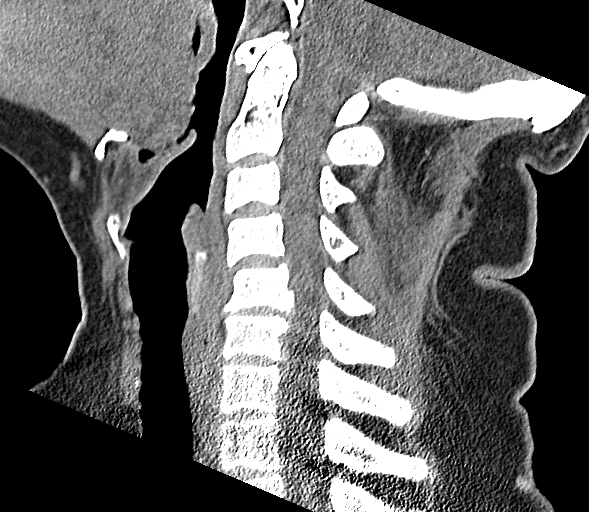
[im 23/45  bone]
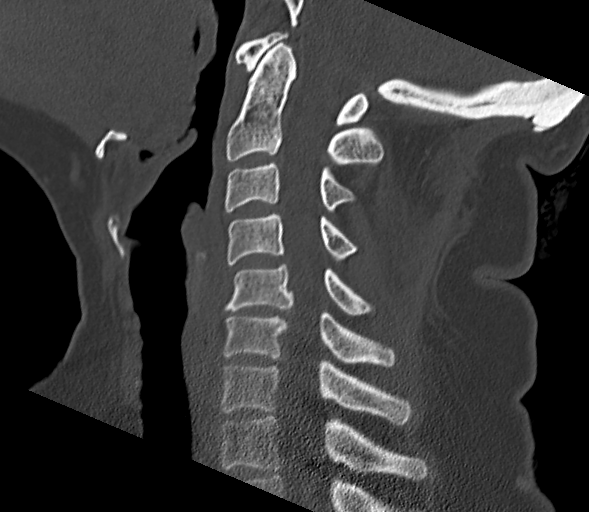
[im 26/45  bone]
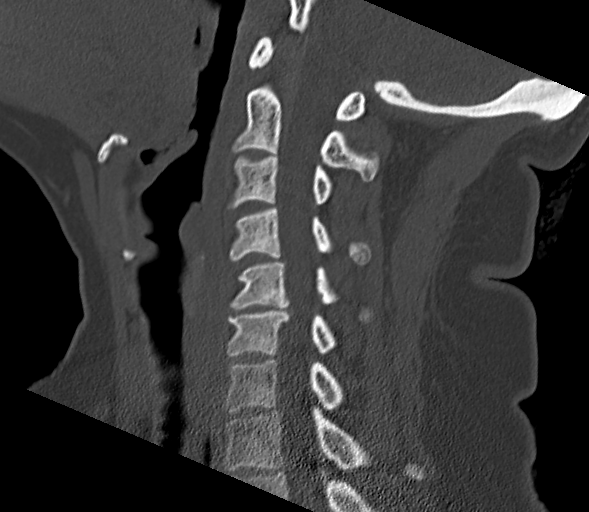
[im 30/45  bone]
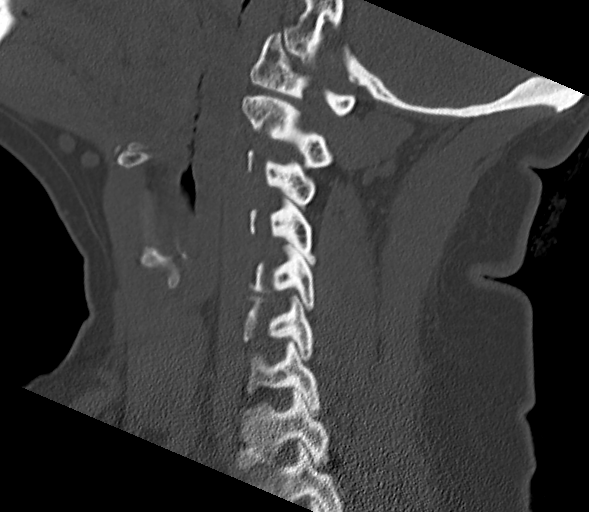

[Series 6: cervical spine coronal bone · coronal · 0.26mm/px · 3 of 61 slices shown]
[im 13/61  bone]
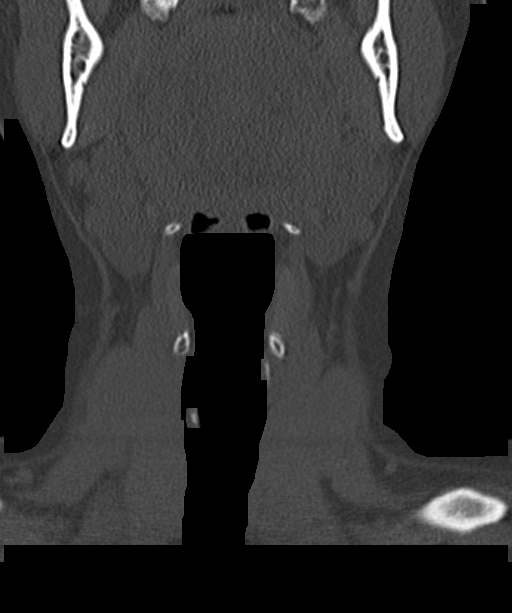
[im 25/61  bone]
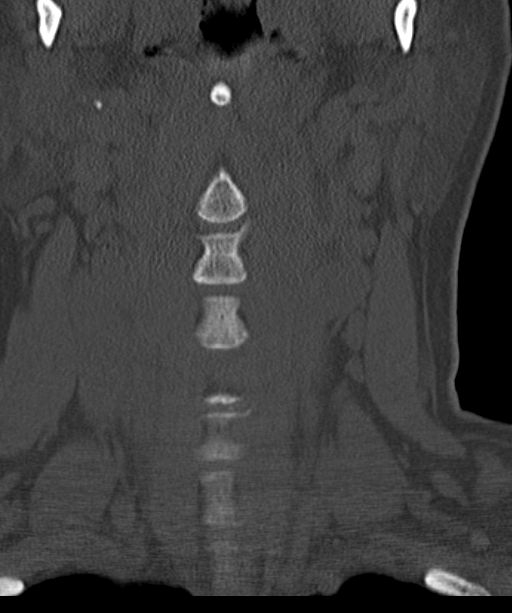
[im 37/61  bone]
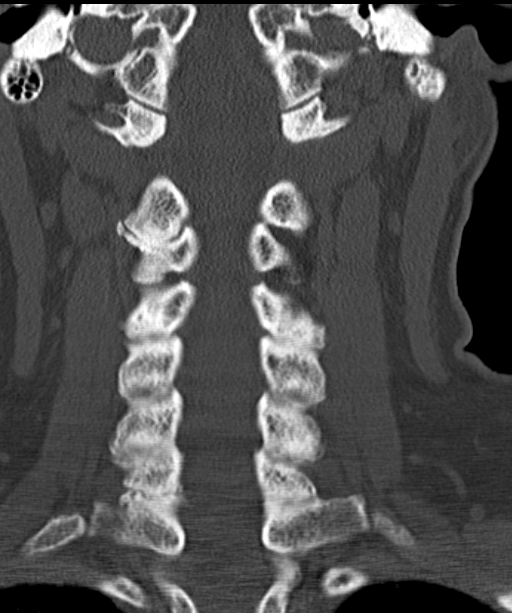

[Series 7: cervical spine axial bone · axial · 0.18mm/px · z∈[+30,+55]mm · 2 of 78 slices shown]
[im 13/78  bone]
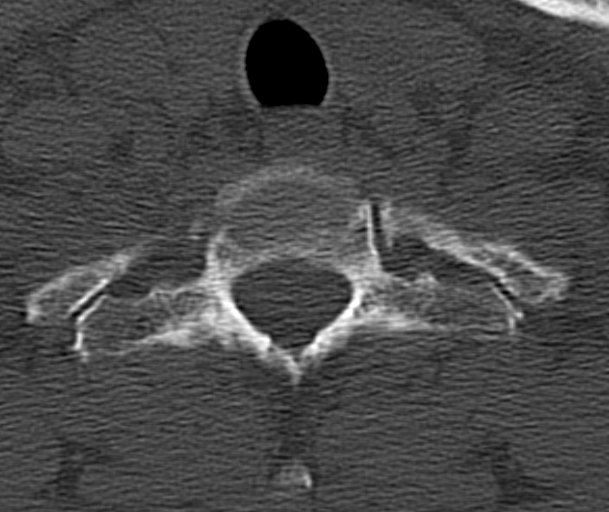
[im 26/78  bone]
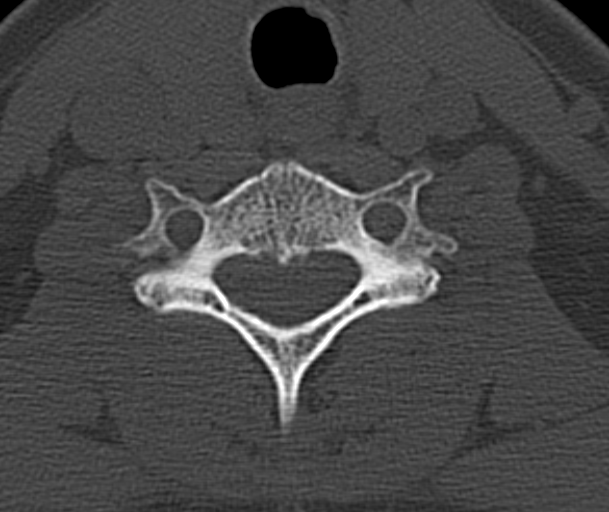

[15 of 33 positions shown; findings below may reference images not displayed]

FINDINGS: Cervical spine alignment is maintained. Vertebral body heights are
preserved. There is no fracture. The dens is intact. There are no
jumped or perched facets. There is disc space narrowing at C5-C6
with associated anterior and posterior endplate spurs. Mild
scattered facet arthropathy. No prevertebral soft tissue edema.
There is a 12 mm hypodense nodule in the left thyroid gland.
IMPRESSION: 1. Degenerative disc disease at C5-C6 with anterior and posterior
endplate spurring. Scattered facet arthropathy. No acute bony
abnormality.
2. Hypodense 12 mm nodule in the left thyroid gland. Nonemergent
thyroid ultrasound recommended for further characterization.

## 2015-01-16 MED ORDER — OXYCODONE-ACETAMINOPHEN 5-325 MG PO TABS
1.0000 | ORAL_TABLET | Freq: Once | ORAL | Status: AC
  Administered 2015-01-16: 1 via ORAL
  Filled 2015-01-16: qty 1

## 2015-01-16 NOTE — ED Notes (Signed)
3 week history of L upper back pain.  States she thought it was a pinched nerve.  Tonight hyperextended neck to put in eye drops when sudden nerve pain shot from back into neck and into arms causing loss of control of both arms and loss of vision for about 1 second.  Now has residual tingling pain in fingers and sharp pain in L lateral back just below scapula.

## 2015-01-17 MED ORDER — OXYCODONE-ACETAMINOPHEN 5-325 MG PO TABS: 1.0000 | ORAL_TABLET | Freq: Four times a day (QID) | ORAL | Status: DC | PRN

## 2015-01-17 MED ORDER — METHYLPREDNISOLONE 4 MG PO TBPK: ORAL_TABLET | ORAL | Status: DC

## 2015-01-17 NOTE — ED Provider Notes (Signed)
CSN: [id number]     Arrival date & time 01/16/15  2046 History   First MD Initiated Contact with Patient 01/16/15 2305     Chief Complaint  Patient presents with  . Back Pain     (Consider location/radiation/quality/duration/timing/severity/associated sxs/prior Treatment) HPI  This a 54 year old female with history of degenerative disc disease and chronic back pain who presents with neck pain and arm tingling. Patient reports that she slung her neck back to the eyedrops in her eyes. She had shooting pains down her bilateral arms. She states that the pain was so bad that she felt like her vision blackout for a minute. She continues to complain of tingling in her fingers. Patient states a several week history of upper back and neck pain. She states that occasionally she feels like her right hand is weak and that she has difficulty gripping with that hand. She has a history of herniated disc and pinched nerves and feels that this may be the cause. She denies any new injury. She denies any fevers.  Past Medical History  Diagnosis Date  . Asthma   . COPD (chronic obstructive pulmonary disease)   . Seasonal allergies   . Hypertension   . DDD (degenerative disc disease)   . Insomnia   . DDD (degenerative disc disease), lumbar   . Fibromyalgia   . DDD (degenerative disc disease), lumbar   . DJD (degenerative joint disease)   . Chronic back pain    Past Surgical History  Procedure Laterality Date  . Breast surgery    . Cholecystectomy    . Abdominal hysterectomy    . Tubal ligation    . Abdominal exploration surgery    . Knee surgery     History reviewed. No pertinent family history. History  Substance Use Topics  . Smoking status: Current Every Day Smoker -- 0.25 packs/day    Types: Cigarettes  . Smokeless tobacco: Not on file  . Alcohol Use: No   OB History    No data available     Review of Systems  Constitutional: Negative for fever.  Respiratory: Negative for chest  tightness and shortness of breath.   Cardiovascular: Negative for chest pain.  Gastrointestinal: Negative for abdominal pain.  Genitourinary: Negative for dysuria.  Musculoskeletal: Positive for back pain and neck pain. Negative for neck stiffness.  Skin: Negative for wound.  Neurological: Positive for weakness and numbness. Negative for light-headedness and headaches.  Psychiatric/Behavioral: Negative for confusion.  All other systems reviewed and are negative.     Allergies  Wellbutrin; Cymbalta; Effexor; Imitrex; Keflex; Remeron; Sulfa antibiotics; Voltaren; Ibuprofen; Nsaids; Toradol; Tramadol; and Vioxx  Home Medications   Prior to Admission medications   Medication Sig Start Date End Date Taking? Authorizing Provider  acetaminophen (TYLENOL) 500 MG tablet Take 1,000 mg by mouth every 6 (six) hours as needed for moderate pain.   Yes Historical Provider, MD  aspirin 325 MG tablet Take 325 mg by mouth daily.   Yes Historical Provider, MD  diphenhydrAMINE (BENADRYL) 25 MG tablet Take 25 mg by mouth every 6 (six) hours as needed for allergies.   Yes Historical Provider, MD  methylPREDNISolone (MEDROL DOSEPAK) 4 MG TBPK tablet Take as directed on packet 01/17/15   Shon Baton, MD  oxyCODONE-acetaminophen (PERCOCET/ROXICET) 5-325 MG per tablet Take 1-2 tablets by mouth every 6 (six) hours as needed for severe pain. 01/17/15   Shon Baton, MD   BP 124/77 mmHg  Pulse 62  Temp(Src) 98.2 F (  36.8 C) (Oral)  Resp 20  Ht 5\' 4"  (1.626 m)  Wt 178 lb (80.74 kg)  BMI 30.54 kg/m2  SpO2 99% Physical Exam  Constitutional: She is oriented to person, place, and time. She appears well-developed and well-nourished. No distress.  HENT:  Head: Normocephalic and atraumatic.  Eyes: Pupils are equal, round, and reactive to light.  Neck: Normal range of motion. Neck supple.  Cardiovascular: Normal rate, regular rhythm and normal heart sounds.   No murmur heard. Pulmonary/Chest: Effort  normal and breath sounds normal. No respiratory distress. She has no wheezes.  Abdominal: Soft. Bowel sounds are normal. There is no tenderness. There is no rebound.  Musculoskeletal:  Normal range of motion of the neck, lower midline C-spine tenderness to palpation without step-off or deformity, no thoracic or lumbar tenderness noted  Neurological: She is alert and oriented to person, place, and time.  5 out of 5 grip, biceps, and triceps strength bilaterally, sensation grossly intact  Skin: Skin is warm and dry.  Psychiatric: She has a normal mood and affect.  Nursing note and vitals reviewed.   ED Course  Procedures (including critical care time) Labs Review Labs Reviewed - No data to display  Imaging Review Ct Cervical Spine Wo Contrast  01/17/2015   CLINICAL DATA:  Sudden onset of neck pain. Tingling pain in fingers.  EXAM: CT CERVICAL SPINE WITHOUT CONTRAST  TECHNIQUE: Multidetector CT imaging of the cervical spine was performed without intravenous contrast. Multiplanar CT image reconstructions were also generated.  COMPARISON:  None.  FINDINGS: Cervical spine alignment is maintained. Vertebral body heights are preserved. There is no fracture. The dens is intact. There are no jumped or perched facets. There is disc space narrowing at C5-C6 with associated anterior and posterior endplate spurs. Mild scattered facet arthropathy. No prevertebral soft tissue edema. There is a 12 mm hypodense nodule in the left thyroid gland.  IMPRESSION: 1. Degenerative disc disease at C5-C6 with anterior and posterior endplate spurring. Scattered facet arthropathy. No acute bony abnormality. 2. Hypodense 12 mm nodule in the left thyroid gland. Nonemergent thyroid ultrasound recommended for further characterization.   Electronically Signed   By: Rubye OaksMelanie  Ehinger M.D.   On: 01/17/2015 00:51     EKG Interpretation None      MDM   Final diagnoses:  Cervical radiculopathy    Patient presents with pain  and paresthesias related to hyper extension of her neck prior to arrival. She is neurologically intact. No gross neurologic deficits noted. Given her history of degenerative disc disease and "pinched nerves" suspect the hyperextension caused cervical radiculopathy and pierced seizures of the upper extremities. CT scan obtained does show degenerative disc disease at C5 and C6.  Given that she is neurologically intact, did not feel she needs emergent MRI imaging. However, she may need urgent outpatient MRI. She does not have a primary physician. She was referred. She'll be given a short course of pain medication.  Of note, CT scan did show a thyroid nodule.  I did not initially provide this information to the patient.  I attempted to contact 1 given the phone number provided and was not successful. Will attempt to recontact and advise of outpatient follow-up.    Shon Batonourtney F Horton, MD 01/17/15 [phone]

## 2015-01-17 NOTE — Discharge Instructions (Signed)

## 2015-01-17 NOTE — ED Provider Notes (Signed)
Message left for Ms. Landeck to call [facility] ED for incidental findings on CT.  Thyroid nodule needs outpatient US imaging.  Ct Cervical Spine Wo Contrast  01/17/2015   CLINICAL DATA:  Sudden onset of neck pain. Tingling pain in fingers.  EXAM: CT CERVICAL SPINE WITHOUT CONTRAST  TECHNIQUE: Multidetector CT imaging of the cervical spine was performed without intravenous contrast. Multiplanar CT image reconstructions were also generated.  COMPARISON:  None.  FINDINGS: Cervical spine alignment is maintained. Vertebral body heights are preserved. There is no fracture. The dens is intact. There are no jumped or perched facets. There is disc space narrowing at C5-C6 with associated anterior and posterior endplate spurs. Mild scattered facet arthropathy. No prevertebral soft tissue edema. There is a 12 mm hypodense nodule in the left thyroid gland.  IMPRESSION: 1. Degenerative disc disease at C5-C6 with anterior and posterior endplate spurring. Scattered facet arthropathy. No acute bony abnormality. 2. Hypodense 12 mm nodule in the left thyroid gland. Nonemergent thyroid ultrasound recommended for further characterization.   Electronically Signed   By: Rubye OaksMelanie  Ehinger M.D.   On: 01/17/2015 00:51      Shon Batonourtney F Myriam Brandhorst, MD 01/17/15 2334

## 2015-01-18 ENCOUNTER — Telehealth (HOSPITAL_COMMUNITY): Payer: Self-pay

## 2015-01-18 NOTE — Telephone Encounter (Signed)
Pt states that Dr Wilkie Ayehorton left message for her to return call to get CT results.  Pt informed. She does have PCP appointment on Friday.

## 2015-02-15 ENCOUNTER — Emergency Department (HOSPITAL_COMMUNITY)
Admission: EM | Admit: 2015-02-15 | Discharge: 2015-02-15 | Disposition: A | Discharge status: Home / Self Care | Payer: No Typology Code available for payment source | Attending: Emergency Medicine | Admitting: Emergency Medicine

## 2015-02-15 ENCOUNTER — Encounter (HOSPITAL_COMMUNITY): Payer: Self-pay | Admitting: Emergency Medicine

## 2015-02-15 DIAGNOSIS — S29092A Other injury of muscle and tendon of back wall of thorax, initial encounter: Secondary | ICD-10-CM | POA: Insufficient documentation

## 2015-02-15 DIAGNOSIS — I10 Essential (primary) hypertension: Secondary | ICD-10-CM | POA: Insufficient documentation

## 2015-02-15 DIAGNOSIS — S4992XA Unspecified injury of left shoulder and upper arm, initial encounter: Secondary | ICD-10-CM | POA: Insufficient documentation

## 2015-02-15 DIAGNOSIS — S161XXA Strain of muscle, fascia and tendon at neck level, initial encounter: Secondary | ICD-10-CM | POA: Diagnosis not present

## 2015-02-15 DIAGNOSIS — J449 Chronic obstructive pulmonary disease, unspecified: Secondary | ICD-10-CM | POA: Diagnosis not present

## 2015-02-15 DIAGNOSIS — Z8739 Personal history of other diseases of the musculoskeletal system and connective tissue: Secondary | ICD-10-CM | POA: Diagnosis not present

## 2015-02-15 DIAGNOSIS — Z720 Tobacco use: Secondary | ICD-10-CM | POA: Diagnosis not present

## 2015-02-15 DIAGNOSIS — Y9389 Activity, other specified: Secondary | ICD-10-CM | POA: Diagnosis not present

## 2015-02-15 DIAGNOSIS — Y998 Other external cause status: Secondary | ICD-10-CM | POA: Diagnosis not present

## 2015-02-15 DIAGNOSIS — G8929 Other chronic pain: Secondary | ICD-10-CM | POA: Diagnosis not present

## 2015-02-15 DIAGNOSIS — Z8669 Personal history of other diseases of the nervous system and sense organs: Secondary | ICD-10-CM | POA: Insufficient documentation

## 2015-02-15 DIAGNOSIS — Y92410 Unspecified street and highway as the place of occurrence of the external cause: Secondary | ICD-10-CM | POA: Insufficient documentation

## 2015-02-15 DIAGNOSIS — S199XXA Unspecified injury of neck, initial encounter: Secondary | ICD-10-CM | POA: Diagnosis present

## 2015-02-15 DIAGNOSIS — Z7982 Long term (current) use of aspirin: Secondary | ICD-10-CM | POA: Insufficient documentation

## 2015-02-15 MED ORDER — HYDROCODONE-ACETAMINOPHEN 5-325 MG PO TABS
1.0000 | ORAL_TABLET | Freq: Once | ORAL | Status: AC
  Administered 2015-02-15: 1 via ORAL
  Filled 2015-02-15: qty 1

## 2015-02-15 MED ORDER — HYDROCODONE-ACETAMINOPHEN 5-325 MG PO TABS: ORAL_TABLET | ORAL | Status: DC

## 2015-02-15 MED ORDER — METHOCARBAMOL 500 MG PO TABS: 500.0000 mg | ORAL_TABLET | Freq: Three times a day (TID) | ORAL | Status: DC

## 2015-02-15 MED ORDER — METHOCARBAMOL 500 MG PO TABS
500.0000 mg | ORAL_TABLET | Freq: Once | ORAL | Status: AC
  Administered 2015-02-15: 500 mg via ORAL
  Filled 2015-02-15: qty 1
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
## 2015-04-25 ENCOUNTER — Ambulatory Visit: Payer: Medicaid Other | Admitting: Physical Therapy

## 2015-04-28 ENCOUNTER — Ambulatory Visit: Payer: Self-pay | Admitting: Family Medicine

## 2015-05-02 ENCOUNTER — Encounter: Payer: Self-pay | Admitting: Family Medicine

## 2015-06-05 ENCOUNTER — Ambulatory Visit: Payer: Medicaid Other | Attending: Anesthesiology | Admitting: Physical Therapy

## 2015-06-05 DIAGNOSIS — M5440 Lumbago with sciatica, unspecified side: Secondary | ICD-10-CM | POA: Insufficient documentation

## 2015-06-05 DIAGNOSIS — G8929 Other chronic pain: Secondary | ICD-10-CM | POA: Insufficient documentation

## 2015-06-05 NOTE — Therapy (Addendum)
[facility] [place], [place], [address] Phone: [phone]   Fax:  [phone]  Physical Therapy Evaluation  Patient Details  Name: Dana Holland MRN: [id number] Date of Birth: Jul 31, 1960 Referring Provider: Brandy Hale MD.  Encounter Date: 06/05/2015      PT End of Session - 06/05/15 1035    Visit Number 1   Number of Visits 1   Date for PT Re-Evaluation 06/05/15   PT Start Time 0950   PT Stop Time 1022   PT Time Calculation (min) 32 min      Past Medical History  Diagnosis Date  . Asthma   . COPD (chronic obstructive pulmonary disease)   . Seasonal allergies   . Hypertension   . DDD (degenerative disc disease)   . Insomnia   . DDD (degenerative disc disease), lumbar   . Fibromyalgia   . DDD (degenerative disc disease), lumbar   . DJD (degenerative joint disease)   . Chronic back pain     Past Surgical History  Procedure Laterality Date  . Breast surgery    . Cholecystectomy    . Abdominal hysterectomy    . Tubal ligation    . Abdominal exploration surgery    . Knee surgery      There were no vitals filed for this visit.  Visit Diagnosis:  Chronic bilateral low back pain with sciatica, sciatica laterality unspecified - Plan: PT plan of care cert/re-cert      Subjective Assessment - 06/05/15 0954    Limitations Sitting;Standing;Other (comment)   How long can you sit comfortably? 20 minutes with weight shifting.   How long can you stand comfortably? 5 minutes "maybe."   How long can you walk comfortably? Need cart in stores.   Patient Stated Goals Decrease pain enough to do basis            [facility] PT Assessment - 06/05/15 0001    Assessment   Medical Diagnosis Lumbago.   Referring Provider Brandy Hale MD.   Onset Date/Surgical Date --  20+ years.   Precautions   Precaution Comments Per MRI:  Spinal subluxation.   Restrictions   Weight Bearing Restrictions No   Balance  Screen   Has the patient fallen in the past 6 months Yes   How many times? --  1   Has the patient had a decrease in activity level because of a fear of falling?  No   Is the patient reluctant to leave their home because of a fear of falling?  No   Home Environment   Living Environment Private residence   Prior Function   Level of Independence Independent   Posture/Postural Control   Posture/Postural Control Postural limitations   Postural Limitations Rounded Shoulders;Forward head;Decreased lumbar lordosis;Increased thoracic kyphosis   ROM / Strength   AROM / PROM / Strength AROM   AROM   Overall AROM Comments Normal lumbar flexion but active extension is limited to 0 degrees and is painful.   Palpation   Palpation comment tener to palpation at L4 to S1 and patient also c/o coccygeal pain.   Special Tests    Special Tests Lumbar;Sacrolliac Tests;Leg LengthTest   Lumbar Tests other  Positive SLR testing.   Sacroiliac Tests  --  (-) FABER testing.   Leg length test  --  Equal leg lengths.   other   Comments Normal bilateral Patellar reflexes but unable to elicit bilateral LE Achille's reflexes.   Ambulation/Gait   Gait  Comments Antalgic gait pattern.                                PT Long Term Goals - 06/05/15 1103    PT LONG TERM GOAL #1   Title Evaluation only.               Plan - 06/05/15 1103    Rehab Potential Poor   PT Frequency --  Evaluation only.         Problem List There are no active problems to display for this patient.  PHYSICAL THERAPY DISCHARGE SUMMARY  Visits from Start of Care: 1.  Current functional level related to goals / functional outcomes: See above.   Remaining deficits: See above.   Education / Equipment:  Plan: Patient agrees to discharge.  Patient goals were met. Patient is being discharged due to                                                     ?????      Winna Golla, [place]  MPT 06/05/2015, 11:13 AM  [facility] [place], [place], [address] Phone: [phone]   Fax:  [phone]  Name: Dana Holland MRN: [id number] Date of Birth: [birth year]

## 2015-06-05 NOTE — Addendum Note (Signed)
Addended by: Bitha Fauteux, [place] W on: 06/05/2015 12:42 PM   Modules accepted: Medications

## 2015-07-14 ENCOUNTER — Other Ambulatory Visit (HOSPITAL_COMMUNITY): Payer: Self-pay | Admitting: Neurosurgery

## 2015-07-31 ENCOUNTER — Encounter (HOSPITAL_COMMUNITY): Payer: Self-pay

## 2015-07-31 ENCOUNTER — Encounter (HOSPITAL_COMMUNITY)
Admission: RE | Admit: 2015-07-31 | Discharge: 2015-07-31 | Disposition: A | Discharge status: Home / Self Care | Payer: Medicaid Other | Source: Ambulatory Visit | Attending: Neurosurgery | Admitting: Neurosurgery

## 2015-07-31 DIAGNOSIS — Z01818 Encounter for other preprocedural examination: Secondary | ICD-10-CM | POA: Insufficient documentation

## 2015-07-31 DIAGNOSIS — I10 Essential (primary) hypertension: Secondary | ICD-10-CM | POA: Diagnosis not present

## 2015-07-31 DIAGNOSIS — Z01812 Encounter for preprocedural laboratory examination: Secondary | ICD-10-CM | POA: Insufficient documentation

## 2015-07-31 HISTORY — DX: Anxiety disorder, unspecified: F41.9

## 2015-07-31 HISTORY — DX: Bipolar disorder, unspecified: F31.9

## 2015-07-31 HISTORY — DX: Depression, unspecified: F32.A

## 2015-07-31 HISTORY — DX: Headache: R51

## 2015-07-31 HISTORY — DX: Major depressive disorder, single episode, unspecified: F32.9

## 2015-07-31 HISTORY — DX: Headache, unspecified: R51.9

## 2015-07-31 LAB — CBC
HCT: 45.6 % (ref 36.0–46.0)
Hemoglobin: 15.2 g/dL — ABNORMAL HIGH (ref 12.0–15.0)
MCH: 32.9 pg (ref 26.0–34.0)
MCHC: 33.3 g/dL (ref 30.0–36.0)
MCV: 98.7 fL (ref 78.0–100.0)
Platelets: 263 10*3/uL (ref 150–400)
RBC: 4.62 MIL/uL (ref 3.87–5.11)
RDW: 13 % (ref 11.5–15.5)
WBC: 13.9 10*3/uL — ABNORMAL HIGH (ref 4.0–10.5)

## 2015-07-31 LAB — BASIC METABOLIC PANEL
Anion gap: 12 (ref 5–15)
BUN: 14 mg/dL (ref 6–20)
CO2: 23 mmol/L (ref 22–32)
Calcium: 9.1 mg/dL (ref 8.9–10.3)
Chloride: 106 mmol/L (ref 101–111)
Creatinine, Ser: 0.87 mg/dL (ref 0.44–1.00)
GFR calc Af Amer: >60 mL/min (ref >60)
GFR calc non Af Amer: >60 mL/min (ref >60)
Glucose, Bld: 109 mg/dL — ABNORMAL HIGH (ref 65–99)
Potassium: 3.4 mmol/L — ABNORMAL LOW (ref 3.5–5.1)
Sodium: 141 mmol/L (ref 135–145)

## 2015-07-31 LAB — SURGICAL PCR SCREEN
MRSA, PCR: NEGATIVE
Staphylococcus aureus: NEGATIVE

## 2015-07-31 NOTE — Pre-Procedure Instructions (Signed)
    Colin InaConnie Quinteros  07/31/2015      [id number] - [place], [place] - [address] [place] [id number] Phone: [phone] Fax: [phone]    Your procedure is scheduled on 08/03/15.  [facility] at 530 A.M.  Call this number if you have problems the morning of surgery:  [phone]   Remember:  Do not eat food or drink liquids after midnight.  Take these medicines the morning of surgery with A SIP OF WATER --all inhalers,prozac,neurontin,hydrocodone   Do not wear jewelry, make-up or nail polish.  Do not wear lotions, powders, or perfumes.  You may wear deodorant.  Do not shave 48 hours prior to surgery.  Men may shave face and neck.  Do not bring valuables to the hospital.  [facility] is not responsible for any belongings or valuables.  Contacts, dentures or bridgework may not be worn into surgery.  Leave your suitcase in the car.  After surgery it may be brought to your room.  For patients admitted to the hospital, discharge time will be determined by your treatment team.  Patients discharged the day of surgery will not be allowed to drive home.   Name and phone number of your driver:    Special instructions:    Please read over the following fact sheets that you were given. Pain Booklet, Coughing and Deep Breathing, MRSA Information and Surgical Site Infection Prevention

## 2015-08-02 MED ORDER — VANCOMYCIN HCL 10 G IV SOLR
1500.0000 mg | INTRAVENOUS | Status: AC
  Administered 2015-08-03: 1500 mg via INTRAVENOUS
  Filled 2015-08-02: qty 1500

## 2015-08-03 ENCOUNTER — Encounter (HOSPITAL_COMMUNITY): Payer: Self-pay | Admitting: *Deleted

## 2015-08-03 ENCOUNTER — Encounter (HOSPITAL_COMMUNITY)
Admission: RE | Disposition: A | Discharge status: Home / Self Care | Payer: Self-pay | Source: Ambulatory Visit | Attending: Neurosurgery

## 2015-08-03 ENCOUNTER — Ambulatory Visit (HOSPITAL_COMMUNITY): Payer: Medicaid Other

## 2015-08-03 ENCOUNTER — Ambulatory Visit (HOSPITAL_COMMUNITY): Payer: Medicaid Other | Admitting: Anesthesiology

## 2015-08-03 ENCOUNTER — Ambulatory Visit (HOSPITAL_COMMUNITY)
Admission: RE | Admit: 2015-08-03 | Discharge: 2015-08-04 | Disposition: A | Discharge status: Home / Self Care | Payer: Medicaid Other | Source: Ambulatory Visit | Attending: Neurosurgery | Admitting: Neurosurgery

## 2015-08-03 DIAGNOSIS — J449 Chronic obstructive pulmonary disease, unspecified: Secondary | ICD-10-CM | POA: Insufficient documentation

## 2015-08-03 DIAGNOSIS — Z7951 Long term (current) use of inhaled steroids: Secondary | ICD-10-CM | POA: Insufficient documentation

## 2015-08-03 DIAGNOSIS — M50123 Cervical disc disorder at C6-C7 level with radiculopathy: Secondary | ICD-10-CM | POA: Diagnosis not present

## 2015-08-03 DIAGNOSIS — Z79899 Other long term (current) drug therapy: Secondary | ICD-10-CM | POA: Diagnosis not present

## 2015-08-03 DIAGNOSIS — J45909 Unspecified asthma, uncomplicated: Secondary | ICD-10-CM | POA: Insufficient documentation

## 2015-08-03 DIAGNOSIS — M4722 Other spondylosis with radiculopathy, cervical region: Secondary | ICD-10-CM | POA: Diagnosis not present

## 2015-08-03 DIAGNOSIS — Z7982 Long term (current) use of aspirin: Secondary | ICD-10-CM | POA: Diagnosis not present

## 2015-08-03 DIAGNOSIS — F319 Bipolar disorder, unspecified: Secondary | ICD-10-CM | POA: Diagnosis not present

## 2015-08-03 DIAGNOSIS — I10 Essential (primary) hypertension: Secondary | ICD-10-CM | POA: Insufficient documentation

## 2015-08-03 DIAGNOSIS — M5020 Other cervical disc displacement, unspecified cervical region: Secondary | ICD-10-CM | POA: Diagnosis present

## 2015-08-03 DIAGNOSIS — M5412 Radiculopathy, cervical region: Secondary | ICD-10-CM | POA: Diagnosis present

## 2015-08-03 DIAGNOSIS — Z87891 Personal history of nicotine dependence: Secondary | ICD-10-CM | POA: Insufficient documentation

## 2015-08-03 DIAGNOSIS — M542 Cervicalgia: Secondary | ICD-10-CM

## 2015-08-03 DIAGNOSIS — M797 Fibromyalgia: Secondary | ICD-10-CM | POA: Diagnosis not present

## 2015-08-03 HISTORY — PX: ANTERIOR CERVICAL DECOMP/DISCECTOMY FUSION: SHX1161

## 2015-08-03 IMAGING — CR DG CERVICAL SPINE 2 OR 3 VIEWS
2 series · 2 of 2 positions shown · non-contrast
Comparison: None.

CLINICAL DATA: Neck pain.

EXAM:
CERVICAL SPINE - 2-3 VIEW

[lat (1 of 2)]
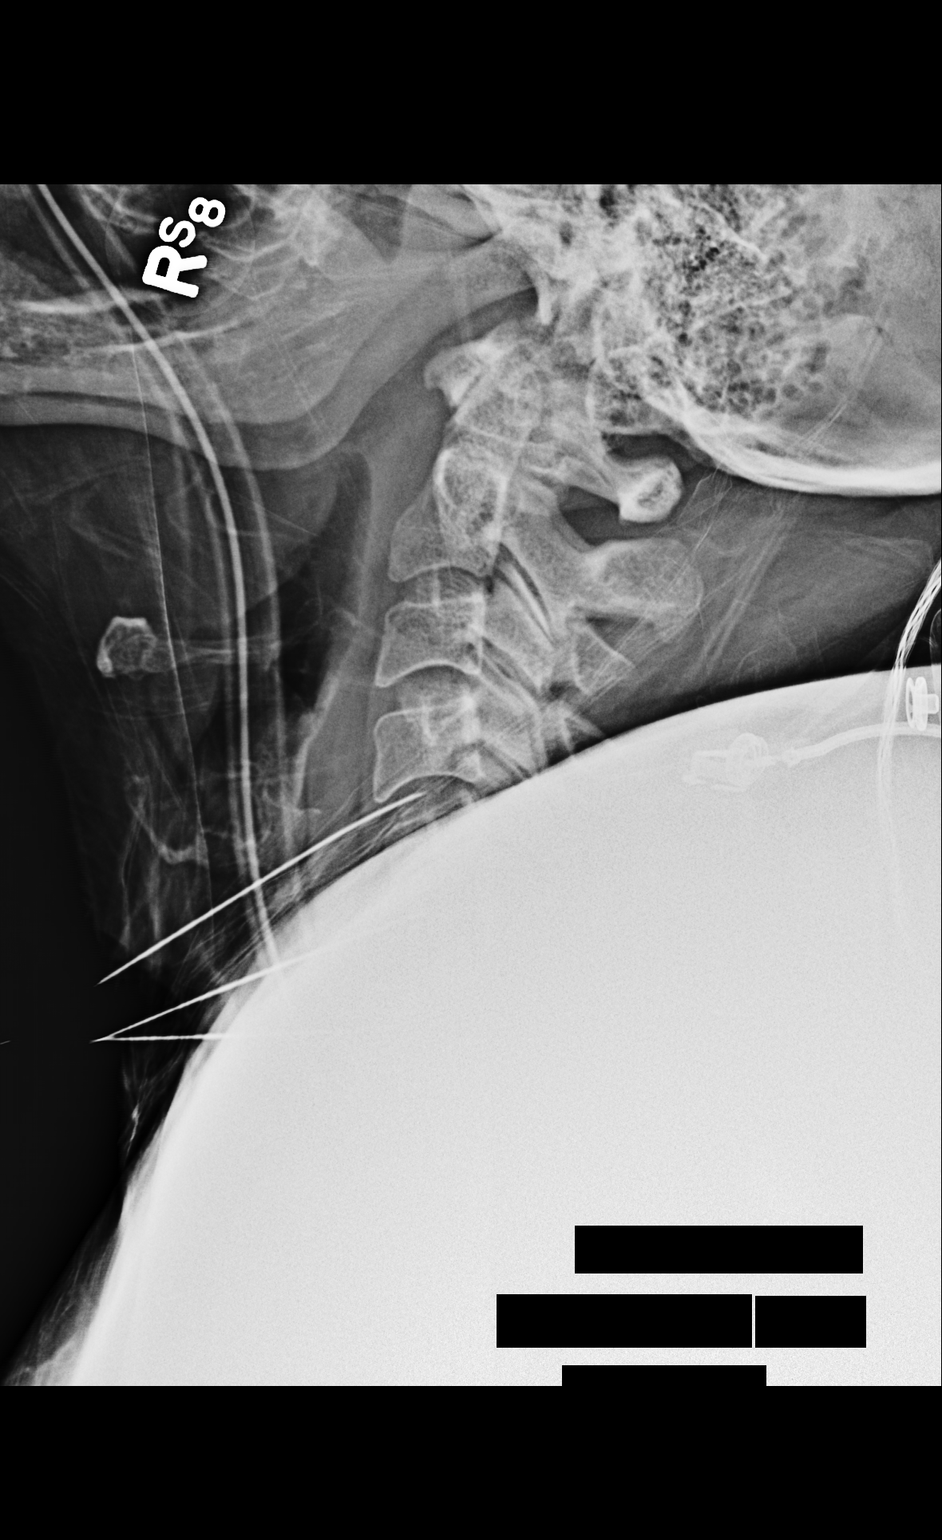

[lat (2 of 2)]
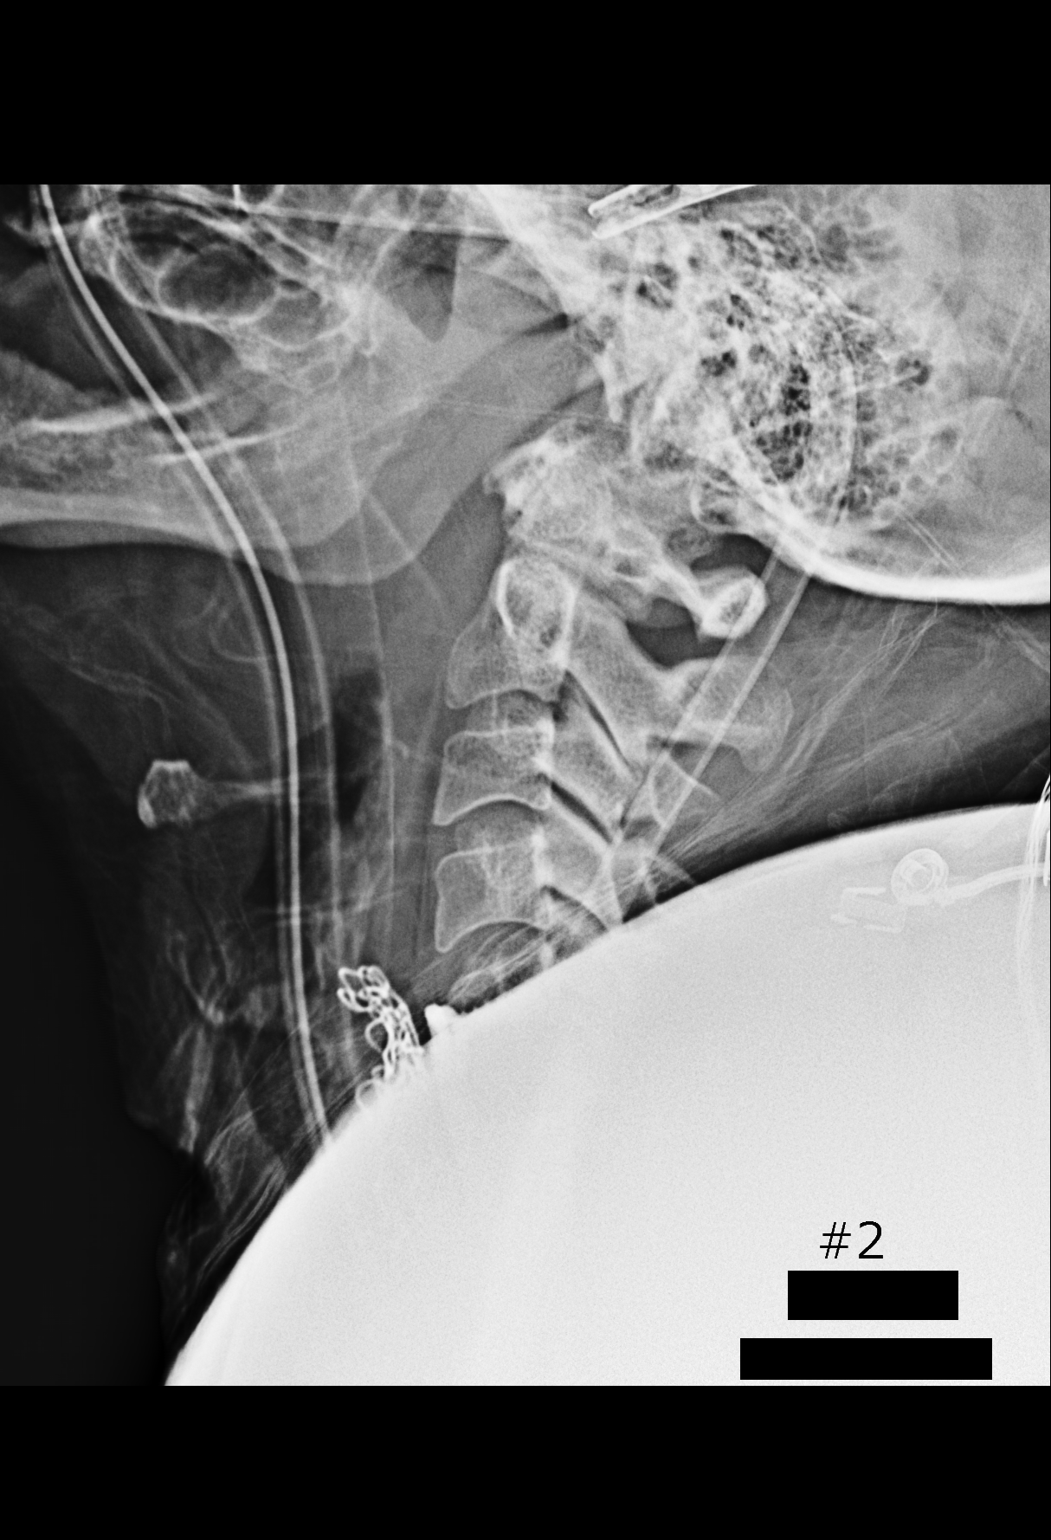

[2 of 2 positions shown; findings below may reference images not displayed]

FINDINGS: New metallic markers noted anterior C4-C5, C5-C6, C6-C7 levels. No
acute bony abnormality identified.
IMPRESSION: Metallic markers noted anteriorly at the C4-C5, C5-C6, C6-C7 levels.

## 2015-08-03 SURGERY — ANTERIOR CERVICAL DECOMPRESSION/DISCECTOMY FUSION 2 LEVELS
Anesthesia: General | Site: Neck

## 2015-08-03 MED ORDER — FENTANYL CITRATE (PF) 250 MCG/5ML IJ SOLN
INTRAMUSCULAR | Status: AC
  Filled 2015-08-03: qty 5

## 2015-08-03 MED ORDER — PHENYLEPHRINE 40 MCG/ML (10ML) SYRINGE FOR IV PUSH (FOR BLOOD PRESSURE SUPPORT)
PREFILLED_SYRINGE | INTRAVENOUS | Status: AC
  Filled 2015-08-03: qty 10

## 2015-08-03 MED ORDER — ROCURONIUM BROMIDE 100 MG/10ML IV SOLN
INTRAVENOUS | Status: DC | PRN
  Administered 2015-08-03: 50 mg via INTRAVENOUS

## 2015-08-03 MED ORDER — HYDROCODONE-ACETAMINOPHEN 5-325 MG PO TABS: 1.0000 | ORAL_TABLET | ORAL | Status: DC | PRN

## 2015-08-03 MED ORDER — MAGNESIUM HYDROXIDE 400 MG/5ML PO SUSP
30.0000 mL | Freq: Every day | ORAL | Status: DC | PRN
Start: 2015-08-03 — End: 2015-08-04

## 2015-08-03 MED ORDER — LIDOCAINE HCL (CARDIAC) 20 MG/ML IV SOLN
INTRAVENOUS | Status: DC | PRN
  Administered 2015-08-03: 3 mL via INTRATRACHEAL
  Administered 2015-08-03: 4 mL via INTRATRACHEAL

## 2015-08-03 MED ORDER — ONDANSETRON HCL 4 MG PO TABS: 4.0000 mg | ORAL_TABLET | Freq: Four times a day (QID) | ORAL | Status: DC | PRN

## 2015-08-03 MED ORDER — ARTIFICIAL TEARS OP OINT
TOPICAL_OINTMENT | OPHTHALMIC | Status: DC | PRN
  Administered 2015-08-03: 1 via OPHTHALMIC

## 2015-08-03 MED ORDER — ACETAMINOPHEN 325 MG PO TABS: 650.0000 mg | ORAL_TABLET | ORAL | Status: DC | PRN

## 2015-08-03 MED ORDER — SODIUM CHLORIDE 0.9 % IJ SOLN: 3.0000 mL | INTRAMUSCULAR | Status: DC | PRN

## 2015-08-03 MED ORDER — SODIUM CHLORIDE 0.9 % IR SOLN
Status: DC | PRN
  Administered 2015-08-03: 500 mL

## 2015-08-03 MED ORDER — ONDANSETRON HCL 4 MG/2ML IJ SOLN
INTRAMUSCULAR | Status: AC
  Filled 2015-08-03: qty 2

## 2015-08-03 MED ORDER — PHENYLEPHRINE HCL 10 MG/ML IJ SOLN
INTRAMUSCULAR | Status: DC | PRN
  Administered 2015-08-03 (×2): 80 ug via INTRAVENOUS
  Administered 2015-08-03 (×2): 120 ug via INTRAVENOUS
  Administered 2015-08-03 (×3): 80 ug via INTRAVENOUS
  Administered 2015-08-03: 120 ug via INTRAVENOUS
  Administered 2015-08-03: 80 ug via INTRAVENOUS

## 2015-08-03 MED ORDER — CYCLOBENZAPRINE HCL 10 MG PO TABS
10.0000 mg | ORAL_TABLET | Freq: Three times a day (TID) | ORAL | Status: DC | PRN
  Administered 2015-08-03 – 2015-08-04 (×3): 10 mg via ORAL
  Filled 2015-08-03 (×2): qty 1

## 2015-08-03 MED ORDER — GLYCOPYRROLATE 0.2 MG/ML IJ SOLN
INTRAMUSCULAR | Status: AC
  Filled 2015-08-03: qty 3

## 2015-08-03 MED ORDER — NEOSTIGMINE METHYLSULFATE 10 MG/10ML IV SOLN
INTRAVENOUS | Status: DC | PRN
  Administered 2015-08-03: 4 mg via INTRAVENOUS

## 2015-08-03 MED ORDER — GLYCOPYRROLATE 0.2 MG/ML IJ SOLN
INTRAMUSCULAR | Status: DC | PRN
  Administered 2015-08-03: 0.6 mg via INTRAVENOUS

## 2015-08-03 MED ORDER — GABAPENTIN 300 MG PO CAPS
300.0000 mg | ORAL_CAPSULE | Freq: Three times a day (TID) | ORAL | Status: DC
  Administered 2015-08-03 – 2015-08-04 (×3): 300 mg via ORAL
  Filled 2015-08-03 (×3): qty 1

## 2015-08-03 MED ORDER — BUPIVACAINE HCL (PF) 0.25 % IJ SOLN
INTRAMUSCULAR | Status: DC | PRN
  Administered 2015-08-03: 7.5 mL

## 2015-08-03 MED ORDER — LIDOCAINE HCL (CARDIAC) 20 MG/ML IV SOLN
INTRAVENOUS | Status: AC
  Filled 2015-08-03: qty 5

## 2015-08-03 MED ORDER — LIDOCAINE HCL 4 % EX SOLN
CUTANEOUS | Status: DC | PRN
  Administered 2015-08-03: 4 mL via TOPICAL

## 2015-08-03 MED ORDER — PROPOFOL 10 MG/ML IV BOLUS
INTRAVENOUS | Status: DC | PRN
  Administered 2015-08-03: 150 mg via INTRAVENOUS

## 2015-08-03 MED ORDER — MENTHOL 3 MG MT LOZG: 1.0000 | LOZENGE | OROMUCOSAL | Status: DC | PRN

## 2015-08-03 MED ORDER — OXYCODONE-ACETAMINOPHEN 5-325 MG PO TABS
1.0000 | ORAL_TABLET | ORAL | Status: DC | PRN
  Administered 2015-08-03 – 2015-08-04 (×6): 2 via ORAL
  Filled 2015-08-03 (×5): qty 2

## 2015-08-03 MED ORDER — LACTATED RINGERS IV SOLN
INTRAVENOUS | Status: DC | PRN
  Administered 2015-08-03 (×2): via INTRAVENOUS

## 2015-08-03 MED ORDER — ACETAMINOPHEN 650 MG RE SUPP: 650.0000 mg | RECTAL | Status: DC | PRN

## 2015-08-03 MED ORDER — KCL IN DEXTROSE-NACL 40-5-0.45 MEQ/L-%-% IV SOLN
INTRAVENOUS | Status: DC
  Filled 2015-08-03 (×4): qty 1000

## 2015-08-03 MED ORDER — DIVALPROEX SODIUM 125 MG PO DR TAB
125.0000 mg | DELAYED_RELEASE_TABLET | Freq: Two times a day (BID) | ORAL | Status: DC
  Filled 2015-08-03 (×4): qty 1

## 2015-08-03 MED ORDER — SODIUM CHLORIDE 0.9 % IJ SOLN
INTRAMUSCULAR | Status: AC
  Filled 2015-08-03: qty 10

## 2015-08-03 MED ORDER — ONDANSETRON HCL 4 MG/2ML IJ SOLN
INTRAMUSCULAR | Status: DC | PRN
  Administered 2015-08-03: 4 mg via INTRAVENOUS

## 2015-08-03 MED ORDER — ALBUTEROL SULFATE (2.5 MG/3ML) 0.083% IN NEBU: 2.5000 mg | INHALATION_SOLUTION | Freq: Four times a day (QID) | RESPIRATORY_TRACT | Status: DC | PRN

## 2015-08-03 MED ORDER — PROPOFOL 10 MG/ML IV BOLUS
INTRAVENOUS | Status: AC
  Filled 2015-08-03: qty 40

## 2015-08-03 MED ORDER — HYDROXYZINE HCL 50 MG/ML IM SOLN: 50.0000 mg | INTRAMUSCULAR | Status: DC | PRN

## 2015-08-03 MED ORDER — NEOSTIGMINE METHYLSULFATE 10 MG/10ML IV SOLN
INTRAVENOUS | Status: AC
  Filled 2015-08-03: qty 1

## 2015-08-03 MED ORDER — SCOPOLAMINE 1 MG/3DAYS TD PT72
MEDICATED_PATCH | TRANSDERMAL | Status: AC
  Administered 2015-08-03: 1 via TRANSDERMAL
  Filled 2015-08-03: qty 1

## 2015-08-03 MED ORDER — BISACODYL 10 MG RE SUPP: 10.0000 mg | Freq: Every day | RECTAL | Status: DC | PRN

## 2015-08-03 MED ORDER — GENTAMICIN IN SALINE 0.8-0.9 MG/ML-% IV SOLN
80.0000 mg | INTRAVENOUS | Status: AC
  Administered 2015-08-03: 80 mg via INTRAVENOUS
  Filled 2015-08-03: qty 100

## 2015-08-03 MED ORDER — CYCLOBENZAPRINE HCL 10 MG PO TABS
ORAL_TABLET | ORAL | Status: AC
  Filled 2015-08-03: qty 1

## 2015-08-03 MED ORDER — OXYCODONE-ACETAMINOPHEN 5-325 MG PO TABS
ORAL_TABLET | ORAL | Status: AC
  Administered 2015-08-03: 2 via ORAL
  Filled 2015-08-03: qty 2

## 2015-08-03 MED ORDER — ONDANSETRON HCL 4 MG/2ML IJ SOLN
4.0000 mg | Freq: Four times a day (QID) | INTRAMUSCULAR | Status: DC | PRN
  Filled 2015-08-03: qty 2

## 2015-08-03 MED ORDER — MIDAZOLAM HCL 2 MG/2ML IJ SOLN
INTRAMUSCULAR | Status: DC | PRN
  Administered 2015-08-03: 2 mg via INTRAVENOUS

## 2015-08-03 MED ORDER — PROMETHAZINE HCL 25 MG/ML IJ SOLN: 6.2500 mg | INTRAMUSCULAR | Status: DC | PRN

## 2015-08-03 MED ORDER — FLUOXETINE HCL 20 MG PO CAPS
20.0000 mg | ORAL_CAPSULE | Freq: Every day | ORAL | Status: DC
  Administered 2015-08-03 – 2015-08-04 (×2): 20 mg via ORAL
  Filled 2015-08-03 (×2): qty 1

## 2015-08-03 MED ORDER — SUCCINYLCHOLINE CHLORIDE 20 MG/ML IJ SOLN
INTRAMUSCULAR | Status: AC
  Filled 2015-08-03: qty 1

## 2015-08-03 MED ORDER — THROMBIN 20000 UNITS EX SOLR
CUTANEOUS | Status: DC | PRN
  Administered 2015-08-03: 20 mL via TOPICAL

## 2015-08-03 MED ORDER — HYDROXYZINE HCL 25 MG PO TABS: 50.0000 mg | ORAL_TABLET | ORAL | Status: DC | PRN

## 2015-08-03 MED ORDER — PHENOL 1.4 % MT LIQD
1.0000 | OROMUCOSAL | Status: DC | PRN
  Administered 2015-08-04: 1 via OROMUCOSAL
  Filled 2015-08-03: qty 177

## 2015-08-03 MED ORDER — KETAMINE HCL 100 MG/ML IJ SOLN
INTRAMUSCULAR | Status: AC
  Administered 2015-08-03 (×4): 10 mg via INTRAVENOUS
  Filled 2015-08-03: qty 1

## 2015-08-03 MED ORDER — FENTANYL CITRATE (PF) 100 MCG/2ML IJ SOLN
25.0000 ug | INTRAMUSCULAR | Status: DC | PRN
  Administered 2015-08-03 (×2): 50 ug via INTRAVENOUS

## 2015-08-03 MED ORDER — THROMBIN 5000 UNITS EX SOLR
OROMUCOSAL | Status: DC | PRN
  Administered 2015-08-03: 10 mL via TOPICAL

## 2015-08-03 MED ORDER — ROCURONIUM BROMIDE 50 MG/5ML IV SOLN
INTRAVENOUS | Status: AC
  Filled 2015-08-03: qty 1

## 2015-08-03 MED ORDER — MIDAZOLAM HCL 2 MG/2ML IJ SOLN
INTRAMUSCULAR | Status: AC
  Filled 2015-08-03: qty 2

## 2015-08-03 MED ORDER — ARTIFICIAL TEARS OP OINT
TOPICAL_OINTMENT | OPHTHALMIC | Status: AC
  Filled 2015-08-03: qty 3.5

## 2015-08-03 MED ORDER — 0.9 % SODIUM CHLORIDE (POUR BTL) OPTIME
TOPICAL | Status: DC | PRN
  Administered 2015-08-03: 1000 mL

## 2015-08-03 MED ORDER — SODIUM CHLORIDE 0.9 % IV SOLN: 250.0000 mL | INTRAVENOUS | Status: DC

## 2015-08-03 MED ORDER — ACETAMINOPHEN 500 MG PO TABS
1000.0000 mg | ORAL_TABLET | ORAL | Status: AC
  Administered 2015-08-03: 1000 mg via ORAL
  Filled 2015-08-03: qty 2

## 2015-08-03 MED ORDER — MORPHINE SULFATE (PF) 4 MG/ML IV SOLN
4.0000 mg | INTRAVENOUS | Status: DC | PRN
  Administered 2015-08-03: 4 mg via INTRAMUSCULAR
  Filled 2015-08-03: qty 1

## 2015-08-03 MED ORDER — FENTANYL CITRATE (PF) 100 MCG/2ML IJ SOLN
INTRAMUSCULAR | Status: AC
  Administered 2015-08-03: 50 ug via INTRAVENOUS
  Filled 2015-08-03: qty 2

## 2015-08-03 MED ORDER — SODIUM CHLORIDE 0.9 % IJ SOLN: 3.0000 mL | Freq: Two times a day (BID) | INTRAMUSCULAR | Status: DC

## 2015-08-03 MED ORDER — FENTANYL CITRATE (PF) 100 MCG/2ML IJ SOLN
INTRAMUSCULAR | Status: DC | PRN
  Administered 2015-08-03: 50 ug via INTRAVENOUS
  Administered 2015-08-03: 150 ug via INTRAVENOUS

## 2015-08-03 MED ORDER — ALUM & MAG HYDROXIDE-SIMETH 200-200-20 MG/5ML PO SUSP: 30.0000 mL | Freq: Four times a day (QID) | ORAL | Status: DC | PRN

## 2015-08-03 MED ORDER — LIDOCAINE-EPINEPHRINE 1 %-1:100000 IJ SOLN
INTRAMUSCULAR | Status: DC | PRN
  Administered 2015-08-03: 7.5 mL

## 2015-08-03 MED ORDER — EPHEDRINE SULFATE 50 MG/ML IJ SOLN
INTRAMUSCULAR | Status: AC
  Filled 2015-08-03: qty 1

## 2015-08-03 MED ORDER — FLUTICASONE PROPIONATE 50 MCG/ACT NA SUSP
1.0000 | Freq: Every day | NASAL | Status: DC
  Filled 2015-08-03: qty 16

## 2015-08-03 SURGICAL SUPPLY — 58 items
ADH SKN CLS APL DERMABOND .7 (GAUZE/BANDAGES/DRESSINGS) ×1
ADH SKN CLS LQ APL DERMABOND (GAUZE/BANDAGES/DRESSINGS) ×1
ALLOGRAFT 7X14X11 (Bone Implant) ×4 IMPLANT
BAG DECANTER FOR FLEXI CONT (MISCELLANEOUS) ×3 IMPLANT
BIT DRILL NEURO 2X3.1 SFT TUCH (MISCELLANEOUS) ×1 IMPLANT
BLADE ULTRA TIP 2M (BLADE) ×3 IMPLANT
BRUSH SCRUB EZ PLAIN DRY (MISCELLANEOUS) ×3 IMPLANT
CANISTER SUCT 3000ML PPV (MISCELLANEOUS) ×3 IMPLANT
COVER MAYO STAND STRL (DRAPES) ×3 IMPLANT
DECANTER SPIKE VIAL GLASS SM (MISCELLANEOUS) ×3 IMPLANT
DERMABOND ADHESIVE PROPEN (GAUZE/BANDAGES/DRESSINGS) ×2
DERMABOND ADVANCED (GAUZE/BANDAGES/DRESSINGS) ×2
DERMABOND ADVANCED .7 DNX12 (GAUZE/BANDAGES/DRESSINGS) ×1 IMPLANT
DERMABOND ADVANCED .7 DNX6 (GAUZE/BANDAGES/DRESSINGS) IMPLANT
DRAPE LAPAROTOMY 100X72 PEDS (DRAPES) ×3 IMPLANT
DRAPE MICROSCOPE LEICA (MISCELLANEOUS) ×3 IMPLANT
DRAPE POUCH INSTRU U-SHP 10X18 (DRAPES) ×3 IMPLANT
DRAPE PROXIMA HALF (DRAPES) IMPLANT
DRILL NEURO 2X3.1 SOFT TOUCH (MISCELLANEOUS) ×3
ELECT COATED BLADE 2.86 ST (ELECTRODE) ×3 IMPLANT
ELECT REM PT RETURN 9FT ADLT (ELECTROSURGICAL) ×3
ELECTRODE REM PT RTRN 9FT ADLT (ELECTROSURGICAL) ×1 IMPLANT
GLOVE BIOGEL PI IND STRL 7.5 (GLOVE) ×1 IMPLANT
GLOVE BIOGEL PI IND STRL 8 (GLOVE) ×1 IMPLANT
GLOVE BIOGEL PI INDICATOR 7.5 (GLOVE) ×2
GLOVE BIOGEL PI INDICATOR 8 (GLOVE) ×2
GLOVE ECLIPSE 7.5 STRL STRAW (GLOVE) ×3 IMPLANT
GLOVE EXAM NITRILE LRG STRL (GLOVE) IMPLANT
GLOVE EXAM NITRILE MD LF STRL (GLOVE) IMPLANT
GLOVE EXAM NITRILE XL STR (GLOVE) IMPLANT
GLOVE EXAM NITRILE XS STR PU (GLOVE) IMPLANT
GOWN STRL REUS W/ TWL LRG LVL3 (GOWN DISPOSABLE) ×2 IMPLANT
GOWN STRL REUS W/ TWL XL LVL3 (GOWN DISPOSABLE) IMPLANT
GOWN STRL REUS W/TWL 2XL LVL3 (GOWN DISPOSABLE) IMPLANT
GOWN STRL REUS W/TWL LRG LVL3 (GOWN DISPOSABLE) ×6
GOWN STRL REUS W/TWL XL LVL3 (GOWN DISPOSABLE) ×3
HALTER HD/CHIN CERV TRACTION D (MISCELLANEOUS) ×3 IMPLANT
HEMOSTAT POWDER KIT SURGIFOAM (HEMOSTASIS) ×3 IMPLANT
KIT BASIN OR (CUSTOM PROCEDURE TRAY) ×3 IMPLANT
KIT ROOM TURNOVER OR (KITS) ×3 IMPLANT
NDL HYPO 25X1 1.5 SAFETY (NEEDLE) ×1 IMPLANT
NDL SPNL 22GX3.5 QUINCKE BK (NEEDLE) ×2 IMPLANT
NEEDLE HYPO 25X1 1.5 SAFETY (NEEDLE) ×3 IMPLANT
NEEDLE SPNL 22GX3.5 QUINCKE BK (NEEDLE) ×6 IMPLANT
NS IRRIG 1000ML POUR BTL (IV SOLUTION) ×3 IMPLANT
PACK LAMINECTOMY NEURO (CUSTOM PROCEDURE TRAY) ×3 IMPLANT
PAD ARMBOARD 7.5X6 YLW CONV (MISCELLANEOUS) ×9 IMPLANT
PLATE AVIATOR ASSY 2LVL SZ 30 (Plate) ×3 IMPLANT
RUBBERBAND STERILE (MISCELLANEOUS) ×6 IMPLANT
SCREW AVIATOR VAR SELFTAP 4X14 (Screw) ×12 IMPLANT
SPONGE INTESTINAL PEANUT (DISPOSABLE) ×3 IMPLANT
SPONGE SURGIFOAM ABS GEL 100 (HEMOSTASIS) ×3 IMPLANT
STAPLER SKIN PROX WIDE 3.9 (STAPLE) IMPLANT
SUT VIC AB 2-0 CP2 18 (SUTURE) ×3 IMPLANT
SUT VIC AB 3-0 SH 8-18 (SUTURE) ×3 IMPLANT
TOWEL OR 17X24 6PK STRL BLUE (TOWEL DISPOSABLE) ×3 IMPLANT
TOWEL OR 17X26 10 PK STRL BLUE (TOWEL DISPOSABLE) ×3 IMPLANT
WATER STERILE IRR 1000ML POUR (IV SOLUTION) ×3 IMPLANT

## 2015-08-03 NOTE — Anesthesia Procedure Notes (Signed)
Procedure Name: Intubation Date/Time: 08/03/2015 8:06 AM Performed by: Willeen Cass P Pre-anesthesia Checklist: Patient identified, Timeout performed, Emergency Drugs available, Suction available and Patient being monitored Patient Re-evaluated:Patient Re-evaluated prior to inductionOxygen Delivery Method: Circle system utilized Preoxygenation: Pre-oxygenation with 100% oxygen Intubation Type: IV induction Ventilation: Mask ventilation without difficulty and Oral airway inserted - appropriate to patient size Laryngoscope Size: Mac and 3 Grade View: Grade I Tube type: Oral Tube size: 7.0 mm Number of attempts: 1 Airway Equipment and Method: Stylet and LTA kit utilized Placement Confirmation: ETT inserted through vocal cords under direct vision,  breath sounds checked- equal and bilateral and positive ETCO2 Secured at: 22 cm Tube secured with: Tape Dental Injury: Teeth and Oropharynx as per pre-operative assessment

## 2015-08-03 NOTE — Transfer of Care (Signed)
Immediate Anesthesia Transfer of Care Note  Patient: Dana Holland  Procedure(s) Performed: Procedure(s) with comments: ACDF C5-C6  C6-C7 (N/A) - ACDF C5-C6  C6-C7  Patient Location: PACU  Anesthesia Type:General  Level of Consciousness: awake, alert , oriented and patient cooperative  Airway & Oxygen Therapy: Patient Spontanous Breathing and Patient connected to nasal cannula oxygen  Post-op Assessment: Report given to RN, Post -op Vital signs reviewed and stable and Patient moving all extremities X 4  Post vital signs: Reviewed and stable  Last Vitals:  Filed Vitals:   08/03/15 1055 08/03/15 1110  BP: 124/57 113/68  Pulse: 96 95  Temp: 36.5 C   Resp: 19 82    Complications: No apparent anesthesia complications

## 2015-08-03 NOTE — Op Note (Signed)
08/03/2015  10:32 AM  PATIENT:  Dana Holland  55 y.o. female  PRE-OPERATIVE DIAGNOSIS:  C5-6 and C6-7 cervical disc herniation, cervical degenerative disease, cervical spondylosis, bilateral cervical radiculopathy  POST-OPERATIVE DIAGNOSIS: C5-6 and C6-7 cervical disc herniation, cervical degenerative disease, cervical spondylosis, bilateral cervical radiculopathy  PROCEDURE:  Procedure(s):  C5-6 and C6-7 anterior cervical decompression and arthrodesis with structural allograft and aviator cervical plating  SURGEON:  Surgeon(s): Shirlean Kelly, MD Coletta Memos, MD  ASSISTANTS: Coletta Memos, M.D.  ANESTHESIA:   general  EBL:  Total I/O In: 1500 [I.V.:1500] Out: 75 [Blood:75]  BLOOD ADMINISTERED:none  COUNT:  Correct per nursing staff  DICTATION: Patient was brought to the operating room placed under general endotracheal anesthesia. Patient was placed in 10 pounds of halter traction. The neck was prepped with Betadine soap and solution and draped in a sterile fashion. A horizontal incision was made on the left side of the neck. The line of the incision was infiltrated with local anesthetic with epinephrine. Dissection was carried down thru the subcutaneous tissue and platysma, bipolar cautery was used to maintain hemostasis. Dissection was then carried out thru an avascular plane leaving the sternocleidomastoid carotid artery and jugular vein laterally and the trachea and esophagus medially. The ventral aspect of the vertebral column was identified and a localizing x-ray was taken. The C5-6 and C6-7 levels were identified. The annulus at each level was incised and the disc space entered. Discectomy was performed with micro-curettes and pituitary rongeurs. The operating microscope was draped and brought into the field provided additional magnification illumination and visualization. Discectomy was continued posteriorly thru the disc space and then the cartilaginous endplate was removed  using micro-curettes along with the high-speed drill. Posterior osteophytic overgrowth was removed each level using the high-speed drill along with a 2 mm thin footplated Kerrison punch. Posterior longitudinal ligament along with disc herniation was carefully removed, decompressing the spinal canal and thecal sac. We then continued to remove osteophytic overgrowth and disc material decompressing the neural foramina and exiting nerve roots bilaterally. Once the decompression was completed hemostasis was established at each level with the use of Gelfoam with thrombin and bipolar cautery. The Gelfoam was removed, a thin layer of Surgifoam was applied, then the wound irrigated and hemostasis confirmed. We then measured the height of the intravertebral disc space level and selected a 7 millimeter in height structural allograft for the C5-6 level and a 7 millimeter in height structural allograft for the C6-7 level . Each was hydrated and saline solution and then gently positioned in the intravertebral disc space and countersunk. We then selected a 30 millimeter in height Aviator cervical plate. It was positioned over the fusion construct and secured to the vertebra with a pair of 4 x 14 mm self-tapping screws at the C5 level, a pair of 4 x 14 mm self-tapping screws at the C6 level, and a pair of 4 x 14 mm screws at the C7 level. Each screw hole was started with the high-speed drill and then the screws placed, once all the screws were placed, the locking system was secured. The wound was irrigated with bacitracin solution checked for hemostasis which was established and confirmed. An x-ray was taken which showed limited visualization of the lower cervical spine, however the screws at C5. In good position and the overall alignment appeared good. We then proceeded with closure. The platysma was closed with interrupted inverted 2-0 undyed Vicryl suture, the subcutaneous and subcuticular closed with interrupted inverted 3-0  undyed Vicryl  suture. The skin edges were approximated with Dermabond. Following surgery the patient was taken out of cervical traction. To be reversed and the anesthetic and taken to the recovery room for further care.   PLAN OF CARE: Admit for overnight observation  PATIENT DISPOSITION:  PACU - hemodynamically stable.   Delay start of Pharmacological VTE agent (>24hrs) due to surgical blood loss or risk of bleeding:  yes

## 2015-08-03 NOTE — Anesthesia Preprocedure Evaluation (Addendum)
Anesthesia Evaluation  Patient identified by MRN, date of birth, ID band Patient awake    Reviewed: Allergy & Precautions, NPO status , Patient's Chart, lab work & pertinent test results  History of Anesthesia Complications Negative for: history of anesthetic complications  Airway Mallampati: III  TM Distance: >3 FB Neck ROM: Full    Dental  (+) Dental Advisory Given, Edentulous Upper, Edentulous Lower, Lower Dentures, Upper Dentures   Pulmonary asthma , COPD,  COPD inhaler, former smoker,    Pulmonary exam normal breath sounds clear to auscultation       Cardiovascular hypertension, Pt. on medications (-) angina(-) CAD and (-) Past MI Normal cardiovascular exam Rhythm:Regular Rate:Normal     Neuro/Psych  Headaches, PSYCHIATRIC DISORDERS Anxiety Depression Bipolar Disorder BUE numbness, tingling    GI/Hepatic negative GI ROS, Neg liver ROS,   Endo/Other  Obesity   Renal/GU negative Renal ROS     Musculoskeletal  (+) Arthritis , Osteoarthritis,  Fibromyalgia -, narcotic dependent  Abdominal   Peds  Hematology negative hematology ROS (+)   Anesthesia Other Findings Day of surgery medications reviewed with the patient.  Reproductive/Obstetrics                           Anesthesia Physical Anesthesia Plan  ASA: III  Anesthesia Plan: General   Post-op Pain Management:    Induction: Intravenous  Airway Management Planned: Oral ETT  Additional Equipment:   Intra-op Plan:   Post-operative Plan: Extubation in OR  Informed Consent: I have reviewed the patients History and Physical, chart, labs and discussed the procedure including the risks, benefits and alternatives for the proposed anesthesia with the patient or authorized representative who has indicated his/her understanding and acceptance.   Dental advisory given  Plan Discussed with: CRNA  Anesthesia Plan Comments:  (Risks/benefits of general anesthesia discussed with patient including risk of damage to teeth, lips, gum, and tongue, nausea/vomiting, allergic reactions to medications, and the possibility of heart attack, stroke and death.  All patient questions answered.  Patient wishes to proceed.)        Anesthesia Quick Evaluation

## 2015-08-03 NOTE — H&P (Signed)
Subjective: Patient is a 55 y.o. right-handed white female who is admitted for treatment of multiple cervical disc herniations, with underlying cervical spondylosis, cervical degenerative disc disease, cervical radiculopathy, cervicalgia, and cervicogenic headache.  Patient has advanced degeneration at the C5-6 and C6-7 levels and is admitted for 2 level anterior cervical decompression and arthrodesis.    Past Medical History  Diagnosis Date  . Asthma   . COPD (chronic obstructive pulmonary disease) (HCC)   . Seasonal allergies   . Hypertension   . Insomnia   . Fibromyalgia   . Chronic back pain   . Headache   . DDD (degenerative disc disease)   . DDD (degenerative disc disease), lumbar   . DDD (degenerative disc disease), lumbar   . DJD (degenerative joint disease)   . Bipolar disorder (HCC)   . Depression   . Anxiety     Past Surgical History  Procedure Laterality Date  . Breast surgery    . Cholecystectomy    . Abdominal hysterectomy    . Tubal ligation    . Abdominal exploration surgery    . Knee surgery      Prescriptions prior to admission  Medication Sig Dispense Refill Last Dose  . albuterol (PROVENTIL HFA;VENTOLIN HFA) 108 (90 Base) MCG/ACT inhaler Inhale 1 puff into the lungs every 6 (six) hours as needed for wheezing or shortness of breath.   08/03/2015 at 0600  . diphenhydrAMINE (BENADRYL) 25 MG tablet Take 25 mg by mouth every 6 (six) hours as needed for allergies.   08/02/2015 at Unknown time  . divalproex (DEPAKOTE) 125 MG DR tablet Take 125 mg by mouth 2 (two) times daily.   08/02/2015 at Unknown time  . FLUoxetine (PROZAC) 20 MG capsule Take 20 mg by mouth daily.   08/02/2015 at Unknown time  . fluticasone (FLONASE) 50 MCG/ACT nasal spray Place 1 spray into both nostrils daily.   08/02/2015 at Unknown time  . gabapentin (NEURONTIN) 300 MG capsule Take 300 mg by mouth 3 (three) times daily.   08/03/2015 at 0545  . HYDROcodone-acetaminophen (NORCO) 10-325 MG tablet  Take 1 tablet by mouth every 8 (eight) hours as needed for moderate pain.   08/03/2015 at 0545  . acetaminophen (TYLENOL) 500 MG tablet Take 1,000 mg by mouth every 6 (six) hours as needed for moderate pain.   Not Taking  . aspirin 325 MG tablet Take 325 mg by mouth daily.   01/16/2015 at Unknown time  . gabapentin (NEURONTIN) 300 MG capsule Take 300 mg by mouth 3 (three) times daily.     Marland Kitchen. HYDROcodone-acetaminophen (NORCO/VICODIN) 5-325 MG per tablet Take one-two tabs po q 4-6 hrs prn pain (Patient not taking: Reported on 07/20/2015) 12 tablet 0   . methocarbamol (ROBAXIN) 500 MG tablet Take 1 tablet (500 mg total) by mouth 3 (three) times daily. (Patient not taking: Reported on 06/05/2015) 21 tablet 0 Not Taking  . methylPREDNISolone (MEDROL DOSEPAK) 4 MG TBPK tablet Take as directed on packet (Patient not taking: Reported on 06/05/2015) 21 tablet 0 Not Taking  . oxyCODONE-acetaminophen (PERCOCET/ROXICET) 5-325 MG per tablet Take 1-2 tablets by mouth every 6 (six) hours as needed for severe pain. (Patient not taking: Reported on 07/20/2015) 15 tablet 0    Allergies  Allergen Reactions  . Wellbutrin [Bupropion] Hives, Shortness Of Breath, Swelling and Rash  . Cymbalta [Duloxetine Hcl] Other (See Comments)    Unknown reaction  . Effexor [Venlafaxine] Other (See Comments)    Unknown reaction  . Imitrex [  Sumatriptan]   . Keflex [Cephalexin] Other (See Comments)    Reaction unknown  . Remeron [Mirtazapine] Other (See Comments)    Chest pain  . Sulfa Antibiotics Hives and Swelling  . Voltaren [Diclofenac Sodium]   . Ibuprofen Hives, Swelling and Rash  . Nsaids Hives, Swelling and Rash  . Toradol [Ketorolac Tromethamine] Hives, Swelling and Rash  . Tramadol Hives, Swelling, Rash and Other (See Comments)    Severe headache/migraine  . Vioxx [Rofecoxib] Hives, Swelling and Rash    Social History  Substance Use Topics  . Smoking status: Former Smoker -- 0.25 packs/day for 40 years    Types:  Cigarettes, E-cigarettes    Quit date: 04/30/2015  . Smokeless tobacco: Never Used  . Alcohol Use: No    History reviewed. No pertinent family history.   Review of Systems Pertinent items noted in HPI and remainder of comprehensive ROS otherwise negative.  Objective: Vital signs in last 24 hours: Temp:  [98.2 F (36.8 C)] 98.2 F (36.8 C) (01/12 4098) Pulse Rate:  [91] 91 (01/12 0608) Resp:  [20] 20 (01/12 0608) BP: (144)/(77) 144/77 mmHg (01/12 0608) SpO2:  [97 %] 97 % (01/12 0608) Weight:  [104.781 kg (231 lb)] 104.781 kg (231 lb) (01/12 1191)  EXAM: Patient is a well-developed well-nourished white female in no acute distress.  Lungs are clear to auscultation , the patient has symmetrical respiratory excursion. Heart has a regular rate and rhythm normal S1 and S2 no murmur.   Abdomen is soft nontender nondistended bowel sounds are present. Extremity examination shows no clubbing cyanosis or edema. Motor examination shows 5 over 5 strength in the upper extremities including the deltoid biceps triceps and intrinsics and grip. Sensation is intact to pinprick throughout the digits of the upper extremities. Reflexes are symmetrical and without evidence of pathologic reflexes. Patient has a normal gait and stance.   Data Review:CBC    Component Value Date/Time   WBC 13.9* 07/31/2015 1340   RBC 4.62 07/31/2015 1340   HGB 15.2* 07/31/2015 1340   HCT 45.6 07/31/2015 1340   PLT 263 07/31/2015 1340   MCV 98.7 07/31/2015 1340   MCH 32.9 07/31/2015 1340   MCHC 33.3 07/31/2015 1340   RDW 13.0 07/31/2015 1340   LYMPHSABS 3.7 11/11/2009 2142   MONOABS 1.1* 11/11/2009 2142   EOSABS 0.3 11/11/2009 2142   BASOSABS 0.3* 11/11/2009 2142                          BMET    Component Value Date/Time   NA 141 07/31/2015 1340   K 3.4* 07/31/2015 1340   CL 106 07/31/2015 1340   CO2 23 07/31/2015 1340   GLUCOSE 109* 07/31/2015 1340   BUN 14 07/31/2015 1340   CREATININE 0.87 07/31/2015 1340    CALCIUM 9.1 07/31/2015 1340   GFRNONAA >60 07/31/2015 1340   GFRAA >60 07/31/2015 1340     Assessment/Plan: Patient with cervical radiculopathy, cervicalgia, cervicogenic headache, secondary to cervical discolorations with cervical spondylosis and degenerative disease at the C5-6 and C6-7 levels. She is admitted now for 2 level C5-6 and C6-7 ACDF.  I've discussed with the patient the nature of his condition, the nature the surgical procedure, the typical length of surgery, hospital stay, and overall recuperation. We discussed limitations postoperatively. I discussed risks of surgery including risks of infection, bleeding, possibly need for transfusion, the risk of nerve root dysfunction with pain, weakness, numbness, or paresthesias, the risk of  spinal cord dysfunction with paralysis of all 4 limbs and quadriplegia, and the risk of dural tear and CSF leakage and possible need for further surgery, the risk of esophageal dysfunction causing dysphagia and the risk of laryngeal dysfunction causing hoarseness of the voice, the risk of failure of the arthrodesis and the possible need for further surgery, and the risk of anesthetic complications including myocardial infarction, stroke, pneumonia, and death. We also discussed the need for postoperative immobilization in a cervical collar. Understanding all this the patient does wish to proceed with surgery and is admitted for such.    Hewitt Shorts, MD 08/03/2015 7:51 AM

## 2015-08-03 NOTE — Progress Notes (Signed)
Filed Vitals:   08/03/15 1055 08/03/15 1110 08/03/15 1155 08/03/15 1212  BP: 124/57 113/68  126/57  Pulse: 96 95  91  Temp: 97.7 F (36.5 C)  97.5 F (36.4 C) 98.8 F (37.1 C)  TempSrc:      Resp: 19 82    Height:      Weight:      SpO2: 92% 92%  100%    Patient sitting up in bed, comfortable. Eating lunch vigorously. No void yet. Has ambulated only to the bathroom. Wound clean and dry; no swelling, erythema, or drainage.  Plan: Strongly encouraged patient to be given to mobilize and ambulate in the halls, increasing distances.  Explained to the patient and her family the importance of granulation both here at the hospital during the initial postoperative period, as well as subsequently at home.  Hewitt ShortsNUDELMAN,ROBERT W, MD 08/03/2015, 2:07 PM

## 2015-08-03 NOTE — Anesthesia Postprocedure Evaluation (Signed)
Anesthesia Post Note  Patient: Dana Holland  Procedure(s) Performed: Procedure(s) (LRB): ACDF C5-C6  C6-C7 (N/A)  Patient location during evaluation: PACU Anesthesia Type: General Level of consciousness: awake and alert, oriented, patient cooperative and awake Pain management: pain level controlled Vital Signs Assessment: post-procedure vital signs reviewed and stable Respiratory status: spontaneous breathing, nonlabored ventilation, respiratory function stable and patient connected to nasal cannula oxygen Cardiovascular status: blood pressure returned to baseline and stable Postop Assessment: no signs of nausea or vomiting Anesthetic complications: no    Last Vitals:  Filed Vitals:   08/03/15 1055 08/03/15 1110  BP: 124/57 113/68  Pulse: 96 95  Temp: 36.5 C   Resp: 19 82    Last Pain:  Filed Vitals:   08/03/15 1134  PainSc: 8                  Cecile HearingStephen Edward Ruperto Kiernan

## 2015-08-04 ENCOUNTER — Encounter (HOSPITAL_COMMUNITY): Payer: Self-pay | Admitting: Neurosurgery

## 2015-08-04 DIAGNOSIS — M50123 Cervical disc disorder at C6-C7 level with radiculopathy: Secondary | ICD-10-CM | POA: Diagnosis not present

## 2015-08-04 MED ORDER — CYCLOBENZAPRINE HCL 10 MG PO TABS: 5.0000 mg | ORAL_TABLET | Freq: Three times a day (TID) | ORAL | Status: DC | PRN

## 2015-08-04 MED ORDER — OXYCODONE-ACETAMINOPHEN 5-325 MG PO TABS: 1.0000 | ORAL_TABLET | ORAL | Status: DC | PRN

## 2015-08-04 NOTE — Progress Notes (Signed)
Patient alert and oriented, mae's well, voiding adequate amount of urine, swallowing without difficulty, c/o mild pain. Patient discharged home with family. Script and discharged instructions given to patient. Patient and family stated understanding of d/c instructions given and has an appointment with MD.   

## 2015-08-04 NOTE — Discharge Instructions (Signed)

## 2015-08-04 NOTE — Discharge Summary (Signed)
Physician Discharge Summary  Patient ID: Dana Holland MRN: [id number] DOB/AGE: 1961/07/14 55 y.o.  Admit date: 08/03/2015 Discharge date: 08/04/2015  Admission Diagnoses:  C5-6 and C6-7 cervical disc herniation, cervical degenerative disease, cervical spondylosis, bilateral cervical radiculopathy  Discharge Diagnoses:  C5-6 and C6-7 cervical disc herniation, cervical degenerative disease, cervical spondylosis, bilateral cervical radiculopathy Active Problems:   HNP (herniated nucleus pulposus), cervical   Discharged Condition: good  Hospital Course: She was admitted, underwent a C5-6 and C6-7 ACDF. She is done well following surgery. She is up and ambulating in the halls. She is voiding well. Her incision is clean and dry; there is no erythema, swelling, or drainage. She is being discharged home with instructions regarding wound care and activity, that were explained both to she and her sister. Included was instructions to do incentive spirometry every hour. She is scheduled follow-up with me in the office in 3 weeks with an x-ray at the office that day.  Discharge Exam: Blood pressure 143/70, pulse 108, temperature 99.1 F (37.3 C), temperature source Oral, resp. rate 20, height 5\' 4"  (1.626 m), weight 104.781 kg (231 lb), SpO2 94 %.  Disposition: 01-Home or Self Care     Medication List    STOP taking these medications        aspirin 325 MG tablet      TAKE these medications        acetaminophen 500 MG tablet  Commonly known as:  TYLENOL  Take 1,000 mg by mouth every 6 (six) hours as needed for moderate pain.     albuterol 108 (90 Base) MCG/ACT inhaler  Commonly known as:  PROVENTIL HFA;VENTOLIN HFA  Inhale 1 puff into the lungs every 6 (six) hours as needed for wheezing or shortness of breath.     cyclobenzaprine 10 MG tablet  Commonly known as:  FLEXERIL  Take 0.5-1 tablets (5-10 mg total) by mouth 3 (three) times daily as needed for muscle spasms.     diphenhydrAMINE 25 MG tablet  Commonly known as:  BENADRYL  Take 25 mg by mouth every 6 (six) hours as needed for allergies.     divalproex 125 MG DR tablet  Commonly known as:  DEPAKOTE  Take 125 mg by mouth 2 (two) times daily.     FLUoxetine 20 MG capsule  Commonly known as:  PROZAC  Take 20 mg by mouth daily.     fluticasone 50 MCG/ACT nasal spray  Commonly known as:  FLONASE  Place 1 spray into both nostrils daily.     gabapentin 300 MG capsule  Commonly known as:  NEURONTIN  Take 300 mg by mouth 3 (three) times daily.     gabapentin 300 MG capsule  Commonly known as:  NEURONTIN  Take 300 mg by mouth 3 (three) times daily.     HYDROcodone-acetaminophen 10-325 MG tablet  Commonly known as:  NORCO  Take 1 tablet by mouth every 8 (eight) hours as needed for moderate pain.     HYDROcodone-acetaminophen 5-325 MG tablet  Commonly known as:  NORCO/VICODIN  Take one-two tabs po q 4-6 hrs prn pain     methocarbamol 500 MG tablet  Commonly known as:  ROBAXIN  Take 1 tablet (500 mg total) by mouth 3 (three) times daily.     methylPREDNISolone 4 MG Tbpk tablet  Commonly known as:  MEDROL DOSEPAK  Take as directed on packet     oxyCODONE-acetaminophen 5-325 MG tablet  Commonly known as:  PERCOCET/ROXICET  Take 1-2 tablets  by mouth every 6 (six) hours as needed for severe pain.     oxyCODONE-acetaminophen 5-325 MG tablet  Commonly known as:  PERCOCET/ROXICET  Take 1-2 tablets by mouth every 4 (four) hours as needed (pain).         SignedHewitt Shorts: NUDELMAN,ROBERT W 08/04/2015, 7:56 AM

## 2015-11-24 ENCOUNTER — Other Ambulatory Visit: Payer: Self-pay | Admitting: Neurosurgery

## 2015-11-28 ENCOUNTER — Encounter (HOSPITAL_COMMUNITY): Payer: Self-pay | Admitting: *Deleted

## 2015-11-28 MED ORDER — VANCOMYCIN HCL 10 G IV SOLR
1500.0000 mg | INTRAVENOUS | Status: AC
  Administered 2015-11-29: 1500 mg via INTRAVENOUS
  Filled 2015-11-28: qty 1500

## 2015-11-29 ENCOUNTER — Encounter (HOSPITAL_COMMUNITY): Payer: Self-pay | Admitting: General Practice

## 2015-11-29 ENCOUNTER — Ambulatory Visit (HOSPITAL_COMMUNITY): Payer: Medicaid Other | Admitting: Anesthesiology

## 2015-11-29 ENCOUNTER — Ambulatory Visit (HOSPITAL_COMMUNITY)
Admission: RE | Admit: 2015-11-29 | Discharge: 2015-11-29 | Disposition: A | Discharge status: Home / Self Care | Payer: Medicaid Other | Source: Ambulatory Visit | Attending: Neurosurgery | Admitting: Neurosurgery

## 2015-11-29 ENCOUNTER — Encounter (HOSPITAL_COMMUNITY)
Admission: RE | Disposition: A | Discharge status: Home / Self Care | Payer: Self-pay | Source: Ambulatory Visit | Attending: Neurosurgery

## 2015-11-29 DIAGNOSIS — Z79899 Other long term (current) drug therapy: Secondary | ICD-10-CM | POA: Insufficient documentation

## 2015-11-29 DIAGNOSIS — Z791 Long term (current) use of non-steroidal anti-inflammatories (NSAID): Secondary | ICD-10-CM | POA: Diagnosis not present

## 2015-11-29 DIAGNOSIS — M1990 Unspecified osteoarthritis, unspecified site: Secondary | ICD-10-CM | POA: Diagnosis not present

## 2015-11-29 DIAGNOSIS — Z6839 Body mass index (BMI) 39.0-39.9, adult: Secondary | ICD-10-CM | POA: Insufficient documentation

## 2015-11-29 DIAGNOSIS — M797 Fibromyalgia: Secondary | ICD-10-CM | POA: Insufficient documentation

## 2015-11-29 DIAGNOSIS — J449 Chronic obstructive pulmonary disease, unspecified: Secondary | ICD-10-CM | POA: Diagnosis not present

## 2015-11-29 DIAGNOSIS — G4700 Insomnia, unspecified: Secondary | ICD-10-CM | POA: Diagnosis not present

## 2015-11-29 DIAGNOSIS — G5603 Carpal tunnel syndrome, bilateral upper limbs: Secondary | ICD-10-CM | POA: Insufficient documentation

## 2015-11-29 DIAGNOSIS — Z79891 Long term (current) use of opiate analgesic: Secondary | ICD-10-CM | POA: Insufficient documentation

## 2015-11-29 DIAGNOSIS — Z87891 Personal history of nicotine dependence: Secondary | ICD-10-CM | POA: Diagnosis not present

## 2015-11-29 DIAGNOSIS — Z7982 Long term (current) use of aspirin: Secondary | ICD-10-CM | POA: Diagnosis not present

## 2015-11-29 DIAGNOSIS — G8929 Other chronic pain: Secondary | ICD-10-CM | POA: Insufficient documentation

## 2015-11-29 DIAGNOSIS — M549 Dorsalgia, unspecified: Secondary | ICD-10-CM | POA: Diagnosis not present

## 2015-11-29 DIAGNOSIS — F319 Bipolar disorder, unspecified: Secondary | ICD-10-CM | POA: Insufficient documentation

## 2015-11-29 HISTORY — PX: CARPAL TUNNEL RELEASE: SHX101

## 2015-11-29 LAB — CBC
HCT: 44.3 % (ref 36.0–46.0)
Hemoglobin: 14.8 g/dL (ref 12.0–15.0)
MCH: 32.3 pg (ref 26.0–34.0)
MCHC: 33.4 g/dL (ref 30.0–36.0)
MCV: 96.7 fL (ref 78.0–100.0)
PLATELETS: 281 10*3/uL (ref 150–400)
RBC: 4.58 MIL/uL (ref 3.87–5.11)
RDW: 13.1 % (ref 11.5–15.5)
WBC: 11.9 10*3/uL — ABNORMAL HIGH (ref 4.0–10.5)

## 2015-11-29 SURGERY — CARPAL TUNNEL RELEASE
Anesthesia: Monitor Anesthesia Care | Site: Hand | Laterality: Right

## 2015-11-29 MED ORDER — FENTANYL CITRATE (PF) 250 MCG/5ML IJ SOLN
INTRAMUSCULAR | Status: AC
  Filled 2015-11-29: qty 5

## 2015-11-29 MED ORDER — ACETAMINOPHEN 10 MG/ML IV SOLN
INTRAVENOUS | Status: AC
  Filled 2015-11-29: qty 100

## 2015-11-29 MED ORDER — PROPOFOL 10 MG/ML IV BOLUS
INTRAVENOUS | Status: AC
  Filled 2015-11-29: qty 20

## 2015-11-29 MED ORDER — LIDOCAINE 2% (20 MG/ML) 5 ML SYRINGE
INTRAMUSCULAR | Status: AC
  Filled 2015-11-29: qty 5

## 2015-11-29 MED ORDER — ACETAMINOPHEN 10 MG/ML IV SOLN
INTRAVENOUS | Status: DC | PRN
  Administered 2015-11-29: 1000 mg via INTRAVENOUS

## 2015-11-29 MED ORDER — ROCURONIUM BROMIDE 50 MG/5ML IV SOLN
INTRAVENOUS | Status: AC
  Filled 2015-11-29: qty 2

## 2015-11-29 MED ORDER — PROPOFOL 10 MG/ML IV BOLUS
INTRAVENOUS | Status: DC | PRN
  Administered 2015-11-29: 50 mg via INTRAVENOUS
  Administered 2015-11-29: 150 mg via INTRAVENOUS

## 2015-11-29 MED ORDER — BUPIVACAINE HCL 0.5 % IJ SOLN
INTRAMUSCULAR | Status: DC | PRN
  Administered 2015-11-29: 9 mL

## 2015-11-29 MED ORDER — OXYCODONE HCL 5 MG/5ML PO SOLN: 5.0000 mg | Freq: Once | ORAL | Status: DC | PRN

## 2015-11-29 MED ORDER — ONDANSETRON HCL 4 MG/2ML IJ SOLN
INTRAMUSCULAR | Status: AC
  Filled 2015-11-29: qty 4

## 2015-11-29 MED ORDER — MIDAZOLAM HCL 5 MG/5ML IJ SOLN
INTRAMUSCULAR | Status: DC | PRN
  Administered 2015-11-29: 2 mg via INTRAVENOUS

## 2015-11-29 MED ORDER — FENTANYL CITRATE (PF) 100 MCG/2ML IJ SOLN
INTRAMUSCULAR | Status: AC
  Filled 2015-11-29: qty 2

## 2015-11-29 MED ORDER — MIDAZOLAM HCL 2 MG/2ML IJ SOLN
INTRAMUSCULAR | Status: AC
  Filled 2015-11-29: qty 2

## 2015-11-29 MED ORDER — ARTIFICIAL TEARS OP OINT
TOPICAL_OINTMENT | OPHTHALMIC | Status: AC
  Filled 2015-11-29: qty 3.5

## 2015-11-29 MED ORDER — OXYCODONE HCL 5 MG PO TABS: 5.0000 mg | ORAL_TABLET | Freq: Once | ORAL | Status: DC | PRN

## 2015-11-29 MED ORDER — GENTAMICIN IN SALINE 1.6-0.9 MG/ML-% IV SOLN
80.0000 mg | INTRAVENOUS | Status: AC
  Administered 2015-11-29: 80 mg via INTRAVENOUS
  Filled 2015-11-29: qty 50

## 2015-11-29 MED ORDER — ESMOLOL HCL 100 MG/10ML IV SOLN
INTRAVENOUS | Status: DC | PRN
  Administered 2015-11-29: 10 mg via INTRAVENOUS

## 2015-11-29 MED ORDER — FENTANYL CITRATE (PF) 100 MCG/2ML IJ SOLN
25.0000 ug | INTRAMUSCULAR | Status: DC | PRN
  Administered 2015-11-29 (×2): 25 ug via INTRAVENOUS
  Administered 2015-11-29: 50 ug via INTRAVENOUS

## 2015-11-29 MED ORDER — LIDOCAINE HCL (CARDIAC) 20 MG/ML IV SOLN
INTRAVENOUS | Status: DC | PRN
  Administered 2015-11-29: 30 mg via INTRAVENOUS

## 2015-11-29 MED ORDER — ONDANSETRON HCL 4 MG/2ML IJ SOLN
INTRAMUSCULAR | Status: DC | PRN
  Administered 2015-11-29: 4 mg via INTRAVENOUS

## 2015-11-29 MED ORDER — LACTATED RINGERS IV SOLN
INTRAVENOUS | Status: DC | PRN
  Administered 2015-11-29: 08:00:00 via INTRAVENOUS

## 2015-11-29 MED ORDER — FENTANYL CITRATE (PF) 100 MCG/2ML IJ SOLN
INTRAMUSCULAR | Status: DC | PRN
  Administered 2015-11-29: 25 ug via INTRAVENOUS
  Administered 2015-11-29: 50 ug via INTRAVENOUS
  Administered 2015-11-29: 75 ug via INTRAVENOUS
  Administered 2015-11-29: 25 ug via INTRAVENOUS
  Administered 2015-11-29: 75 ug via INTRAVENOUS

## 2015-11-29 MED ORDER — ONDANSETRON HCL 4 MG/2ML IJ SOLN: 4.0000 mg | Freq: Four times a day (QID) | INTRAMUSCULAR | Status: DC | PRN

## 2015-11-29 SURGICAL SUPPLY — 29 items
BANDAGE GAUZE 4  KLING STR (GAUZE/BANDAGES/DRESSINGS) ×2 IMPLANT
BLADE SURG 15 STRL LF DISP TIS (BLADE) ×2 IMPLANT
BLADE SURG 15 STRL SS (BLADE) ×4
BRUSH SCRUB EZ PLAIN DRY (MISCELLANEOUS) ×2 IMPLANT
CORDS BIPOLAR (ELECTRODE) ×2 IMPLANT
DRAPE EXTREMITY T 121X128X90 (DRAPE) ×2 IMPLANT
DRAPE PROXIMA HALF (DRAPES) IMPLANT
DRSG EMULSION OIL 3X3 NADH (GAUZE/BANDAGES/DRESSINGS) ×2 IMPLANT
GAUZE SPONGE 4X4 12PLY STRL (GAUZE/BANDAGES/DRESSINGS) ×2 IMPLANT
GAUZE SPONGE 4X4 16PLY XRAY LF (GAUZE/BANDAGES/DRESSINGS) ×2 IMPLANT
GLOVE BIOGEL PI IND STRL 8 (GLOVE) ×1 IMPLANT
GLOVE BIOGEL PI INDICATOR 8 (GLOVE) ×1
GLOVE ECLIPSE 7.5 STRL STRAW (GLOVE) ×2 IMPLANT
GLOVE EXAM NITRILE LRG STRL (GLOVE) IMPLANT
GLOVE EXAM NITRILE MD LF STRL (GLOVE) IMPLANT
GLOVE EXAM NITRILE XL STR (GLOVE) IMPLANT
GLOVE EXAM NITRILE XS STR PU (GLOVE) IMPLANT
GOWN STRL REUS W/ TWL XL LVL3 (GOWN DISPOSABLE) ×2 IMPLANT
GOWN STRL REUS W/TWL 2XL LVL3 (GOWN DISPOSABLE) IMPLANT
GOWN STRL REUS W/TWL XL LVL3 (GOWN DISPOSABLE) ×4
KIT BASIN OR (CUSTOM PROCEDURE TRAY) ×2 IMPLANT
KIT ROOM TURNOVER OR (KITS) ×2 IMPLANT
PACK SURGICAL SETUP 50X90 (CUSTOM PROCEDURE TRAY) ×2 IMPLANT
PAD ARMBOARD 7.5X6 YLW CONV (MISCELLANEOUS) ×6 IMPLANT
STOCKINETTE 4X48 STRL (DRAPES) ×2 IMPLANT
SUT ETHILON 4 0 PS 2 18 (SUTURE) ×2 IMPLANT
SUT VIC AB 3-0 SH 8-18 (SUTURE) ×2 IMPLANT
TOWEL OR 17X24 6PK STRL BLUE (TOWEL DISPOSABLE) ×6 IMPLANT
UNDERPAD 30X30 INCONTINENT (UNDERPADS AND DIAPERS) ×2 IMPLANT

## 2015-11-29 NOTE — Anesthesia Procedure Notes (Signed)
Procedure Name: LMA Insertion Date/Time: 11/29/2015 8:29 AM Performed by: Carmela RimaMARTINELLI, Plez Belton F Pre-anesthesia Checklist: Patient being monitored, Suction available, Emergency Drugs available, Patient identified and Timeout performed Patient Re-evaluated:Patient Re-evaluated prior to inductionOxygen Delivery Method: Circle system utilized Preoxygenation: Pre-oxygenation with 100% oxygen Intubation Type: IV induction Ventilation: Mask ventilation without difficulty LMA: LMA inserted LMA Size: 4.0 Placement Confirmation: positive ETCO2,  ETT inserted through vocal cords under direct vision and breath sounds checked- equal and bilateral Tube secured with: Tape Dental Injury: Teeth and Oropharynx as per pre-operative assessment

## 2015-11-29 NOTE — Progress Notes (Signed)
Orthopedic Tech Progress Note Patient Details:  Dana InaConnie Holland [birth year] [id number]  Patient ID: Dana Inaonnie Holland, female   DOB: [birth year], 55 y.o.   MRN: [id number] As ordered by Dr. Lavinia SharpsNudelman  Dana Holland 11/29/2015, 9:31 AM

## 2015-11-29 NOTE — Discharge Instructions (Signed)
Elevate arm  Sling when moving around  See MD  On Friday  Do not get arm wet  Wiggle fingers and thumb every hour

## 2015-11-29 NOTE — Anesthesia Preprocedure Evaluation (Signed)
Anesthesia Evaluation  Patient identified by MRN, date of birth, ID band Patient awake    Reviewed: Allergy & Precautions, H&P , NPO status , Patient's Chart, lab work & pertinent test results  Airway Mallampati: II   Neck ROM: full    Dental   Pulmonary asthma , COPD, former smoker,    breath sounds clear to auscultation       Cardiovascular negative cardio ROS   Rhythm:regular Rate:Normal     Neuro/Psych  Headaches, Anxiety Depression Bipolar Disorder  Neuromuscular disease    GI/Hepatic   Endo/Other  Morbid obesity  Renal/GU      Musculoskeletal  (+) Arthritis , Fibromyalgia -  Abdominal   Peds  Hematology   Anesthesia Other Findings   Reproductive/Obstetrics                             Anesthesia Physical Anesthesia Plan  ASA: II  Anesthesia Plan: MAC   Post-op Pain Management:    Induction: Intravenous  Airway Management Planned: Simple Face Mask  Additional Equipment:   Intra-op Plan:   Post-operative Plan:   Informed Consent: I have reviewed the patients History and Physical, chart, labs and discussed the procedure including the risks, benefits and alternatives for the proposed anesthesia with the patient or authorized representative who has indicated his/her understanding and acceptance.     Plan Discussed with: CRNA, Anesthesiologist and Surgeon  Anesthesia Plan Comments:         Anesthesia Quick Evaluation

## 2015-11-29 NOTE — Transfer of Care (Signed)
Immediate Anesthesia Transfer of Care Note  Patient: Dana Holland  Procedure(s) Performed: Procedure(s) with comments: CARPAL TUNNEL RELEASE-Right (Right) - CARPAL TUNNEL RELEASE-Right  Patient Location: PACU  Anesthesia Type:General  Level of Consciousness: awake, alert  and oriented  Airway & Oxygen Therapy: Patient Spontanous Breathing and Patient connected to face mask oxygen  Post-op Assessment: Report given to RN, Post -op Vital signs reviewed and stable and Patient moving all extremities X 4  Post vital signs: Reviewed and stable  Last Vitals:  Filed Vitals:   11/29/15 0703  BP: 154/85  Pulse: 109  Temp: 36.8 C  Resp: 20    Last Pain:  Filed Vitals:   11/29/15 0926  PainSc: 6       Patients Stated Pain Goal: 5 (11/29/15 0721)  Complications: No apparent anesthesia complications

## 2015-11-29 NOTE — Anesthesia Postprocedure Evaluation (Signed)
Anesthesia Post Note  Patient: Dana Holland  Procedure(s) Performed: Procedure(s) (LRB): CARPAL TUNNEL RELEASE-Right (Right)  Patient location during evaluation: PACU Anesthesia Type: General Level of consciousness: awake and alert and patient cooperative Pain management: pain level controlled Vital Signs Assessment: post-procedure vital signs reviewed and stable Respiratory status: spontaneous breathing and respiratory function stable Cardiovascular status: stable Anesthetic complications: no    Last Vitals:  Filed Vitals:   11/29/15 0956 11/29/15 1000  BP: 131/70   Pulse: 95 95  Temp:    Resp: 28 13    Last Pain:  Filed Vitals:   11/29/15 1002  PainSc: Asleep                 Aryaman Haliburton S

## 2015-11-29 NOTE — Op Note (Signed)
11/29/2015  9:21 AM  PATIENT:  Dana Holland  55 y.o. female  PRE-OPERATIVE DIAGNOSIS:  Bilateral carpal tunnel syndrome  POST-OPERATIVE DIAGNOSIS:  Bilateral carpal tunnel syndrome  PROCEDURE:  Procedure(s):  CARPAL TUNNEL RELEASE-Right  SURGEON:  Surgeon(s): Shirlean Kellyobert Nudelman, MD  ANESTHESIA:   general  EBL:  < 5 cc  COUNT: Correct per nursing staff  DICTATION: Patient was brought to the operating room and placed under general anesthesia with an LMA by the anesthesia service. The right upper extremity was prepped with Betadine soap and solution and draped in a sterile fashion. An incision was made in the right palm extending from just distal to the distal carpal crease towards the mid palm. It is located just medial to the thenar crease. Dissection was carried down to subcutaneous tissue to the transverse carpal ligament. The ligament was opened sharply from a distal to proximal direction taking care to leave the underlying structures undisturbed. It was opened in its full distal to proximal extent. We found severe thickening of the ligament and compression of the median nerve, which was decompressed. Good decompression of the carpal tunnel and its contents was achieved. We then proceeded with closure. Subcutaneous layer was closed with interrupted inverted 3-0 undyed Vicryl suture. Skin is approximate interrupted horizontal mattress sutures using 4-0 nylon. The wound was dressed with gauze fluffs and wrapped with a Kling. The procedure was tolerated well, and following the procedure the patient wishes to the recovery room for further care.   PLAN OF CARE: Discharge to home after PACU  PATIENT DISPOSITION:  PACU - hemodynamically stable.

## 2015-11-29 NOTE — H&P (Signed)
Subjective: Patient is a 55 y.o. right handed white female who is admitted for treatment of bilateral carpal tunnel syndrome.  Patient is 4 months status post a 2 level C5-6 and C6-7 ACDF and has been found to have very severe right carpal tunnel syndrome and moderate left carpal tunnel syndrome. She is admitted now for a right carpal tunnel release. She also has advanced degeneration at the L4-5 level and has been scheduled for definitive lumbar surgery next month.   Patient Active Problem List   Diagnosis Date Noted  . HNP (herniated nucleus pulposus), cervical 08/03/2015   Past Medical History  Diagnosis Date  . Asthma   . COPD (chronic obstructive pulmonary disease) (HCC)   . Seasonal allergies   . Insomnia     takes Ambien nightly  . Fibromyalgia   . Chronic back pain   . Headache   . DDD (degenerative disc disease)   . DDD (degenerative disc disease), lumbar   . DDD (degenerative disc disease), lumbar   . DJD (degenerative joint disease)   . Anxiety   . Depression     takes Prozac daily  . Bipolar disorder (HCC)     takes Lamictal daily    Past Surgical History  Procedure Laterality Date  . Breast surgery    . Cholecystectomy    . Abdominal hysterectomy    . Tubal ligation    . Abdominal exploration surgery    . Knee surgery    . Anterior cervical decomp/discectomy fusion N/A 08/03/2015    Procedure: ACDF C5-C6  C6-C7;  Surgeon: Shirlean Kelly, MD;  Location: [facility];  Service: Neurosurgery;  Laterality: N/A;  ACDF C5-C6  C6-C7    Prescriptions prior to admission  Medication Sig Dispense Refill Last Dose  . albuterol (PROVENTIL HFA;VENTOLIN HFA) 108 (90 Base) MCG/ACT inhaler Inhale 1 puff into the lungs every 6 (six) hours as needed for wheezing or shortness of breath.   11/29/2015 at 0430  . cyclobenzaprine (FLEXERIL) 10 MG tablet Take 0.5-1 tablets (5-10 mg total) by mouth 3 (three) times daily as needed for muscle spasms. 50 tablet 0 11/28/2015 at Unknown time  .  diphenhydrAMINE (BENADRYL) 25 MG tablet Take 25 mg by mouth every 6 (six) hours as needed for allergies.   11/28/2015 at Unknown time  . FLUoxetine (PROZAC) 20 MG capsule Take 20 mg by mouth daily.   Past Week at Unknown time  . fluticasone (FLONASE) 50 MCG/ACT nasal spray Place 1 spray into both nostrils daily.   11/28/2015 at Unknown time  . gabapentin (NEURONTIN) 600 MG tablet Take 600-1,200 mg by mouth 3 (three) times daily. 600 mg twice a day and 1200 mg at bedtime  0 11/29/2015 at 0430  . HYDROcodone-acetaminophen (NORCO) 10-325 MG tablet Take 1 tablet by mouth every 6 (six) hours as needed for moderate pain.    11/29/2015 at 0430  . lamoTRIgine (LAMICTAL) 100 MG tablet Take 100 mg by mouth 2 (two) times daily.   0 11/28/2015 at Unknown time  . naproxen sodium (ANAPROX) 220 MG tablet Take 440 mg by mouth daily as needed (for pain).   Past Week at Unknown time  . zolpidem (AMBIEN) 10 MG tablet Take 10 mg by mouth at bedtime as needed. for sleep  0 11/28/2015 at Unknown time  . aspirin 325 MG EC tablet Take 325 mg by mouth daily as needed for pain.   More than a month at Unknown time  . HYDROcodone-acetaminophen (NORCO/VICODIN) 5-325 MG per tablet  Take one-two tabs po q 4-6 hrs prn pain (Patient taking differently: Take 1-2 tablets by mouth every 4 (four) hours as needed for moderate pain. ) 12 tablet 0    Allergies  Allergen Reactions  . Wellbutrin [Bupropion] Hives, Shortness Of Breath, Swelling and Rash  . Cymbalta [Duloxetine Hcl] Other (See Comments)    Unknown  . Paroxetine Hcl Other (See Comments)    Felt like bugs biting all over  . Compazine [Prochlorperazine Edisylate] Other (See Comments)    Felt like pt was trapped, hallucinations  . Effexor [Venlafaxine] Other (See Comments)    Unknown reaction  . Imitrex [Sumatriptan] Other (See Comments)    Blood vessels in neck felt like they were going to burst, potential stroke  . Keflex [Cephalexin] Hives and Swelling  . Remeron [Mirtazapine]  Other (See Comments)    Chest pain  . Sulfa Antibiotics Hives and Swelling  . Voltaren [Diclofenac Sodium] Other (See Comments)    Severe intestinal bleeding, stomach in knots  . Ibuprofen Hives, Swelling and Rash  . Nsaids Hives, Swelling and Rash  . Toradol [Ketorolac Tromethamine] Hives, Swelling and Rash  . Tramadol Other (See Comments)    Severe headache/migraine  . Vioxx [Rofecoxib] Hives, Swelling and Rash    Social History  Substance Use Topics  . Smoking status: Former Smoker -- 0.25 packs/day for 40 years    Types: Cigarettes, E-cigarettes    Quit date: 04/30/2015  . Smokeless tobacco: Never Used  . Alcohol Use: No    History reviewed. No pertinent family history.   Review of Systems A comprehensive review of systems was negative.  Objective: Vital signs in last 24 hours: Temp:  [98.3 F (36.8 C)] 98.3 F (36.8 C) (05/10 0703) Pulse Rate:  [109] 109 (05/10 0703) Resp:  [20] 20 (05/10 0703) BP: (154)/(85) 154/85 mmHg (05/10 0703) SpO2:  [98 %] 98 % (05/10 0703) Weight:  [104.781 kg (231 lb)] 104.781 kg (231 lb) (05/10 0703)  EXAM: Patient well-developed well-nourished white female in no acute distress. Lungs are clear to auscultation , the patient has symmetrical respiratory excursion. Heart has a regular rate and rhythm normal S1 and S2 no murmur.   Abdomen is soft nontender nondistended bowel sounds are present. Extremity examination shows no clubbing cyanosis or edema. Motor examination shows 5 over 5 strength in the upper extremities including the deltoid biceps triceps and intrinsics and grip. Sensation is intact to pinprick throughout the digits of the upper extremities. Reflexes are symmetrical and without evidence of pathologic reflexes. Patient has a normal gait and stance.   Data Review:CBC    Component Value Date/Time   WBC 13.9* 07/31/2015 1340   RBC 4.62 07/31/2015 1340   HGB 15.2* 07/31/2015 1340   HCT 45.6 07/31/2015 1340   PLT 263 07/31/2015  1340   MCV 98.7 07/31/2015 1340   MCH 32.9 07/31/2015 1340   MCHC 33.3 07/31/2015 1340   RDW 13.0 07/31/2015 1340   LYMPHSABS 3.7 11/11/2009 2142   MONOABS 1.1* 11/11/2009 2142   EOSABS 0.3 11/11/2009 2142   BASOSABS 0.3* 11/11/2009 2142                          BMET    Component Value Date/Time   NA 141 07/31/2015 1340   K 3.4* 07/31/2015 1340   CL 106 07/31/2015 1340   CO2 23 07/31/2015 1340   GLUCOSE 109* 07/31/2015 1340   BUN 14 07/31/2015 1340  CREATININE 0.87 07/31/2015 1340   CALCIUM 9.1 07/31/2015 1340   GFRNONAA >60 07/31/2015 1340   GFRAA >60 07/31/2015 1340     Assessment/Plan: Patient with bilateral carpal tunnel syndrome, who is brought to surgery for a right carpal tunnel release. I discussed the nature of surgery and its risks including risks of infection, bleeding, neurologic dysfunction, and the anesthetic complications. Understanding this she does want to proceed with surgery.   Hewitt Shorts, MD 11/29/2015 7:38 AM

## 2015-12-01 ENCOUNTER — Encounter (HOSPITAL_COMMUNITY): Payer: Self-pay | Admitting: Neurosurgery

## 2015-12-25 ENCOUNTER — Encounter (HOSPITAL_COMMUNITY): Payer: Self-pay

## 2015-12-25 ENCOUNTER — Encounter (HOSPITAL_COMMUNITY)
Admission: RE | Admit: 2015-12-25 | Discharge: 2015-12-25 | Disposition: A | Discharge status: Home / Self Care | Payer: Medicaid Other | Source: Ambulatory Visit | Attending: Neurosurgery | Admitting: Neurosurgery

## 2015-12-25 DIAGNOSIS — Z0183 Encounter for blood typing: Secondary | ICD-10-CM | POA: Insufficient documentation

## 2015-12-25 DIAGNOSIS — Z01812 Encounter for preprocedural laboratory examination: Secondary | ICD-10-CM | POA: Diagnosis not present

## 2015-12-25 DIAGNOSIS — M4806 Spinal stenosis, lumbar region: Secondary | ICD-10-CM | POA: Diagnosis not present

## 2015-12-25 HISTORY — DX: Spinal stenosis, lumbar region with neurogenic claudication: M48.062

## 2015-12-25 HISTORY — DX: Reserved for inherently not codable concepts without codable children: IMO0001

## 2015-12-25 LAB — BASIC METABOLIC PANEL
ANION GAP: 7 (ref 5–15)
BUN: 10 mg/dL (ref 6–20)
CO2: 25 mmol/L (ref 22–32)
Calcium: 9.4 mg/dL (ref 8.9–10.3)
Chloride: 107 mmol/L (ref 101–111)
Creatinine, Ser: 0.84 mg/dL (ref 0.44–1.00)
Glucose, Bld: 193 mg/dL — ABNORMAL HIGH (ref 65–99)
POTASSIUM: 4.2 mmol/L (ref 3.5–5.1)
SODIUM: 139 mmol/L (ref 135–145)

## 2015-12-25 LAB — CBC
HEMATOCRIT: 43.3 % (ref 36.0–46.0)
HEMOGLOBIN: 14.5 g/dL (ref 12.0–15.0)
MCH: 32.7 pg (ref 26.0–34.0)
MCHC: 33.5 g/dL (ref 30.0–36.0)
MCV: 97.5 fL (ref 78.0–100.0)
Platelets: 280 10*3/uL (ref 150–400)
RBC: 4.44 MIL/uL (ref 3.87–5.11)
RDW: 13.5 % (ref 11.5–15.5)
WBC: 11.7 10*3/uL — ABNORMAL HIGH (ref 4.0–10.5)

## 2015-12-25 LAB — TYPE AND SCREEN
ABO/RH(D): A POS
Antibody Screen: NEGATIVE

## 2015-12-25 LAB — ABO/RH: ABO/RH(D): A POS

## 2015-12-25 LAB — SURGICAL PCR SCREEN
MRSA, PCR: NEGATIVE
Staphylococcus aureus: NEGATIVE

## 2015-12-25 NOTE — Progress Notes (Signed)
Pt denies any acute cardiopulmonary issues. Pt denies being under the care of a cardiologist. Pt denies having a cardiac cath but stated that a stress test was performed 8-9 years ago by Dr. Ronney AstersHorchrein, Cardiology and an echo was completed >10 years ago by Dr. Warrick ParisianZarante, Cardiology.

## 2015-12-25 NOTE — Pre-Procedure Instructions (Signed)
Colin InaConnie Torbert  12/25/2015      [id number] - [place], [place] - [address] [place] [id number] Phone: [phone] Fax: [phone]    Your procedure is scheduled on Monday, January 01, 2016  [facility] Ad[facility] at 5:30 A.M.  Call this number if you have problems the morning of surgery:  [phone]   Remember:  Do not eat food or drink liquids after midnight Sunday, December 31, 2015   Take these medicines the morning of surgery with A SIP OF WATER : FLUoxetine (PROZAC), gabapentin (NEURONTIN), lamoTRIgine (LAMICTAL), fluticasone (FLONASE) nasal spray, if needed: HYDROcodone-acetaminophen (NORCO) for pain,albuterol (PROVENTIL HFA;VENTOLIN) inhaler for wheezing or shortness of breath ( Bring inhaler in with you on day of surgery).  Stop taking Aspirin, vitamins, fish oil and herbal medications. Do not take any NSAIDs ie: Ibuprofen, Advil, Naproxen ( Anaprox), BC and Goody Powder or any medication containing Aspirin; stop now.  Do not wear jewelry, make-up or nail polish.  Do not wear lotions, powders, or perfumes.  You may not wear deodorant.  Do not shave 48 hours prior to surgery.  Do not bring valuables to the hospital.  [facility] for any belongings or valuables.  Contacts, dentures or bridgework may not be worn into surgery.  Leave your suitcase in the car.  After surgery it may be brought to your room.  For patients admitted to the hospital, discharge time will be determined by your treatment team.  Patients discharged the day of surgery will not be allowed to drive home.   Name and phone number of your driver:  Special instructions: [facility] - Preparing for Surgery  Before surgery, you can play an important role.  Because skin is not sterile, your skin needs to be as free of germs as possible.  You can reduce the number of germs on you skin by washing with CHG (chlorahexidine gluconate) soap  before surgery.  CHG is an antiseptic cleaner which kills germs and bonds with the skin to continue killing germs even after washing.  Please DO NOT use if you have an allergy to CHG or antibacterial soaps.  If your skin becomes reddened/irritated stop using the CHG and inform your nurse when you arrive at Short Stay.  Do not shave (including legs and underarms) for at least 48 hours prior to the first CHG shower.  You may shave your face.  Please follow these instructions carefully:   1.  Shower with CHG Soap the night before surgery and the morning of Surgery.  2.  If you choose to wash your hair, wash your hair first as usual with your normal shampoo.  3.  After you shampoo, rinse your hair and body thoroughly to remove the Shampoo.  4.  Use CHG as you would any other liquid soap.  You can apply chg directly  to the skin and wash gently with scrungie or a clean washcloth.  5.  Apply the CHG Soap to your body ONLY FROM THE NECK DOWN.  Do not use on open wounds or open sores.  Avoid contact with your eyes, ears, mouth and genitals (private parts).  Wash genitals (private parts) with your normal soap.  6.  Wash thoroughly, paying special attention to the area where your surgery will be performed.  7.  Thoroughly rinse your body with warm water from the neck down.  8.  DO NOT shower/wash with your normal soap  after using and rinsing off the CHG Soap.  9.  Pat yourself dry with a clean towel.            10.  Wear clean pajamas.            11.  Place clean sheets on your bed the night of your first shower and do not sleep with pets.  Day of Surgery  Do not apply any lotions/deodorants the morning of surgery.  Please wear clean clothes to the hospital/surgery center.  Please read over the following fact sheets that you were given. Pain Booklet, Coughing and Deep Breathing, Blood Transfusion Information, MRSA Information and Surgical Site Infection Prevention

## 2015-12-25 NOTE — Progress Notes (Signed)
Pt left without having vital signs repeated as requested.

## 2015-12-29 MED ORDER — VANCOMYCIN HCL 10 G IV SOLR
1500.0000 mg | INTRAVENOUS | Status: AC
  Administered 2016-01-01: 1500 mg via INTRAVENOUS
  Filled 2015-12-29: qty 1500

## 2015-12-31 NOTE — Anesthesia Preprocedure Evaluation (Signed)
Anesthesia Evaluation  Patient identified by MRN, date of birth, ID band Patient awake    Reviewed: Allergy & Precautions, H&P , NPO status , Patient's Chart, lab work & pertinent test results, reviewed documented beta blocker date and time   History of Anesthesia Complications Negative for: history of anesthetic complications  Airway Mallampati: III  TM Distance: >3 FB Neck ROM: Full    Dental no notable dental hx. (+) Dental Advisory Given, Edentulous Upper, Edentulous Lower, Lower Dentures, Upper Dentures   Pulmonary asthma , COPD,  COPD inhaler, former smoker,    Pulmonary exam normal breath sounds clear to auscultation       Cardiovascular hypertension, Pt. on medications and On Medications (-) angina(-) CAD and (-) Past MI Normal cardiovascular exam Rhythm:Regular Rate:Normal     Neuro/Psych  Headaches, PSYCHIATRIC DISORDERS Anxiety Depression Bipolar Disorder BUE numbness, tingling    GI/Hepatic negative GI ROS, Neg liver ROS,   Endo/Other  Morbid obesityObesity   Renal/GU negative Renal ROS     Musculoskeletal  (+) Arthritis , Osteoarthritis,  Fibromyalgia -, narcotic dependent  Abdominal   Peds  Hematology negative hematology ROS (+)   Anesthesia Other Findings Inhaler for asthma   Reproductive/Obstetrics                             Anesthesia Physical  Anesthesia Plan  ASA: III  Anesthesia Plan: General   Post-op Pain Management:    Induction: Intravenous  Airway Management Planned: Oral ETT  Additional Equipment:   Intra-op Plan:   Post-operative Plan: Extubation in OR  Informed Consent: I have reviewed the patients History and Physical, chart, labs and discussed the procedure including the risks, benefits and alternatives for the proposed anesthesia with the patient or authorized representative who has indicated his/her understanding and acceptance.   Dental  advisory given  Plan Discussed with: CRNA  Anesthesia Plan Comments: (Risks/benefits of general anesthesia discussed with patient including risk of damage to teeth, lips, gum, and tongue, nausea/vomiting, allergic reactions to medications, and the possibility of heart attack, stroke and death.  All patient questions answered.  Patient wishes to proceed.)        Anesthesia Quick Evaluation                                  Anesthesia Evaluation  Patient identified by MRN, date of birth, ID band Patient awake    Reviewed: Allergy & Precautions, NPO status , Patient's Chart, lab work & pertinent test results  History of Anesthesia Complications Negative for: history of anesthetic complications  Airway Mallampati: III  TM Distance: >3 FB Neck ROM: Full    Dental  (+) Dental Advisory Given, Edentulous Upper, Edentulous Lower, Lower Dentures, Upper Dentures   Pulmonary asthma , COPD,  COPD inhaler, former smoker,    Pulmonary exam normal breath sounds clear to auscultation       Cardiovascular hypertension, Pt. on medications (-) angina(-) CAD and (-) Past MI Normal cardiovascular exam Rhythm:Regular Rate:Normal     Neuro/Psych  Headaches, PSYCHIATRIC DISORDERS Anxiety Depression Bipolar Disorder BUE numbness, tingling    GI/Hepatic negative GI ROS, Neg liver ROS,   Endo/Other  Obesity   Renal/GU negative Renal ROS     Musculoskeletal  (+) Arthritis , Osteoarthritis,  Fibromyalgia -, narcotic dependent  Abdominal   Peds  Hematology negative hematology ROS (+)   Anesthesia  Other Findings Day of surgery medications reviewed with the patient.  Reproductive/Obstetrics                           Anesthesia Physical Anesthesia Plan  ASA: III  Anesthesia Plan: General   Post-op Pain Management:    Induction: Intravenous  Airway Management Planned: Oral ETT  Additional Equipment:   Intra-op Plan:   Post-operative Plan:  Extubation in OR  Informed Consent: I have reviewed the patients History and Physical, chart, labs and discussed the procedure including the risks, benefits and alternatives for the proposed anesthesia with the patient or authorized representative who has indicated his/her understanding and acceptance.   Dental advisory given  Plan Discussed with: CRNA  Anesthesia Plan Comments: (Risks/benefits of general anesthesia discussed with patient including risk of damage to teeth, lips, gum, and tongue, nausea/vomiting, allergic reactions to medications, and the possibility of heart attack, stroke and death.  All patient questions answered.  Patient wishes to proceed.)        Anesthesia Quick Evaluation

## 2016-01-01 ENCOUNTER — Inpatient Hospital Stay (HOSPITAL_COMMUNITY): Payer: Medicaid Other

## 2016-01-01 ENCOUNTER — Inpatient Hospital Stay (HOSPITAL_COMMUNITY): Payer: Medicaid Other | Admitting: Anesthesiology

## 2016-01-01 ENCOUNTER — Encounter (HOSPITAL_COMMUNITY)
Admission: RE | Disposition: A | Discharge status: Home / Self Care | Payer: Self-pay | Source: Ambulatory Visit | Attending: Neurosurgery

## 2016-01-01 ENCOUNTER — Inpatient Hospital Stay (HOSPITAL_COMMUNITY)
Admission: RE | Admit: 2016-01-01 | Discharge: 2016-01-02 | DRG: 460 | Disposition: A | Discharge status: Home / Self Care | Payer: Medicaid Other | Source: Ambulatory Visit | Attending: Neurosurgery | Admitting: Neurosurgery

## 2016-01-01 ENCOUNTER — Encounter (HOSPITAL_COMMUNITY): Payer: Self-pay | Admitting: Certified Registered Nurse Anesthetist

## 2016-01-01 DIAGNOSIS — F319 Bipolar disorder, unspecified: Secondary | ICD-10-CM | POA: Diagnosis present

## 2016-01-01 DIAGNOSIS — M47816 Spondylosis without myelopathy or radiculopathy, lumbar region: Secondary | ICD-10-CM | POA: Diagnosis present

## 2016-01-01 DIAGNOSIS — Z79899 Other long term (current) drug therapy: Secondary | ICD-10-CM

## 2016-01-01 DIAGNOSIS — M5136 Other intervertebral disc degeneration, lumbar region: Secondary | ICD-10-CM | POA: Diagnosis not present

## 2016-01-01 DIAGNOSIS — M4806 Spinal stenosis, lumbar region: Secondary | ICD-10-CM | POA: Diagnosis present

## 2016-01-01 DIAGNOSIS — Z87891 Personal history of nicotine dependence: Secondary | ICD-10-CM

## 2016-01-01 DIAGNOSIS — J449 Chronic obstructive pulmonary disease, unspecified: Secondary | ICD-10-CM | POA: Diagnosis present

## 2016-01-01 DIAGNOSIS — Z981 Arthrodesis status: Secondary | ICD-10-CM

## 2016-01-01 DIAGNOSIS — M4316 Spondylolisthesis, lumbar region: Principal | ICD-10-CM | POA: Diagnosis present

## 2016-01-01 DIAGNOSIS — Z7982 Long term (current) use of aspirin: Secondary | ICD-10-CM

## 2016-01-01 DIAGNOSIS — G4700 Insomnia, unspecified: Secondary | ICD-10-CM | POA: Diagnosis present

## 2016-01-01 DIAGNOSIS — M545 Low back pain: Secondary | ICD-10-CM | POA: Diagnosis present

## 2016-01-01 DIAGNOSIS — M48062 Spinal stenosis, lumbar region with neurogenic claudication: Secondary | ICD-10-CM | POA: Diagnosis present

## 2016-01-01 DIAGNOSIS — Z419 Encounter for procedure for purposes other than remedying health state, unspecified: Secondary | ICD-10-CM

## 2016-01-01 DIAGNOSIS — Z7951 Long term (current) use of inhaled steroids: Secondary | ICD-10-CM

## 2016-01-01 DIAGNOSIS — M797 Fibromyalgia: Secondary | ICD-10-CM | POA: Diagnosis present

## 2016-01-01 IMAGING — CR DG LUMBAR SPINE 2-3V
1 series · 1 of 1 positions shown · non-contrast
Comparison: [DATE].

CLINICAL DATA: Lumbar spine surgery.

EXAM:
LUMBAR SPINE - 2-3 VIEW

[lat]
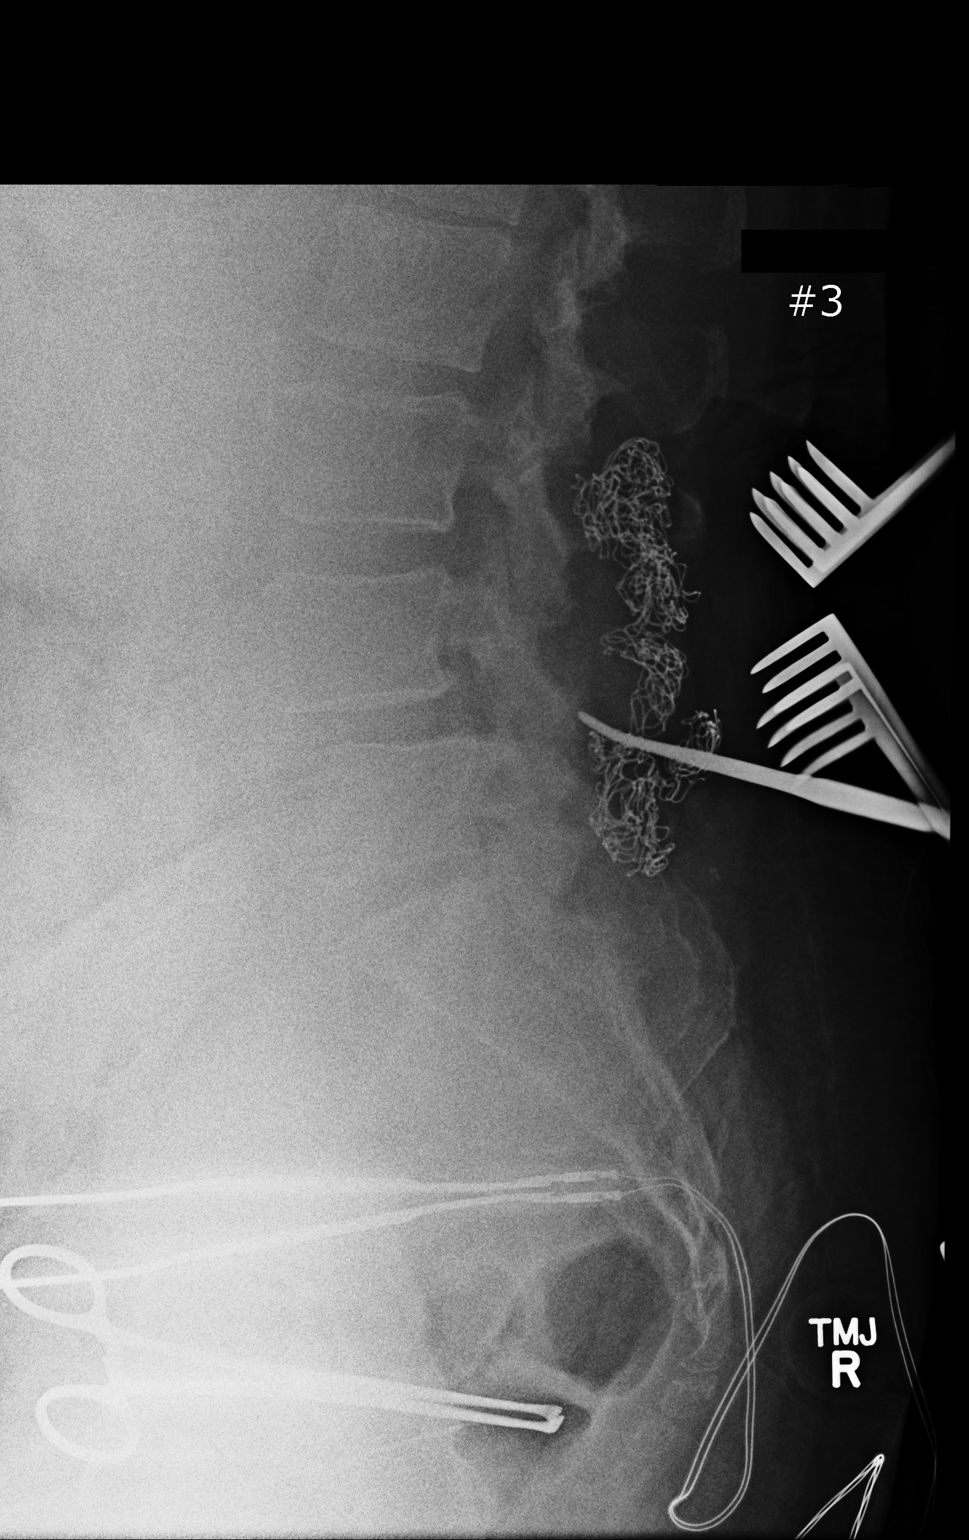

[1 of 1 positions shown; findings below may reference images not displayed]

FINDINGS: Metallic marker is noted posteriorly at the lower L4 level. No acute
bony abnormality.
IMPRESSION: Metallic marker noted posteriorly at the lower L4 level.

## 2016-01-01 IMAGING — CR DG LUMBAR SPINE 2-3V
1 series · 1 of 1 positions shown · non-contrast
Comparison: [DATE].

CLINICAL DATA: Lumbar spine surgery.

EXAM:
LUMBAR SPINE - 2-3 VIEW

[lat]
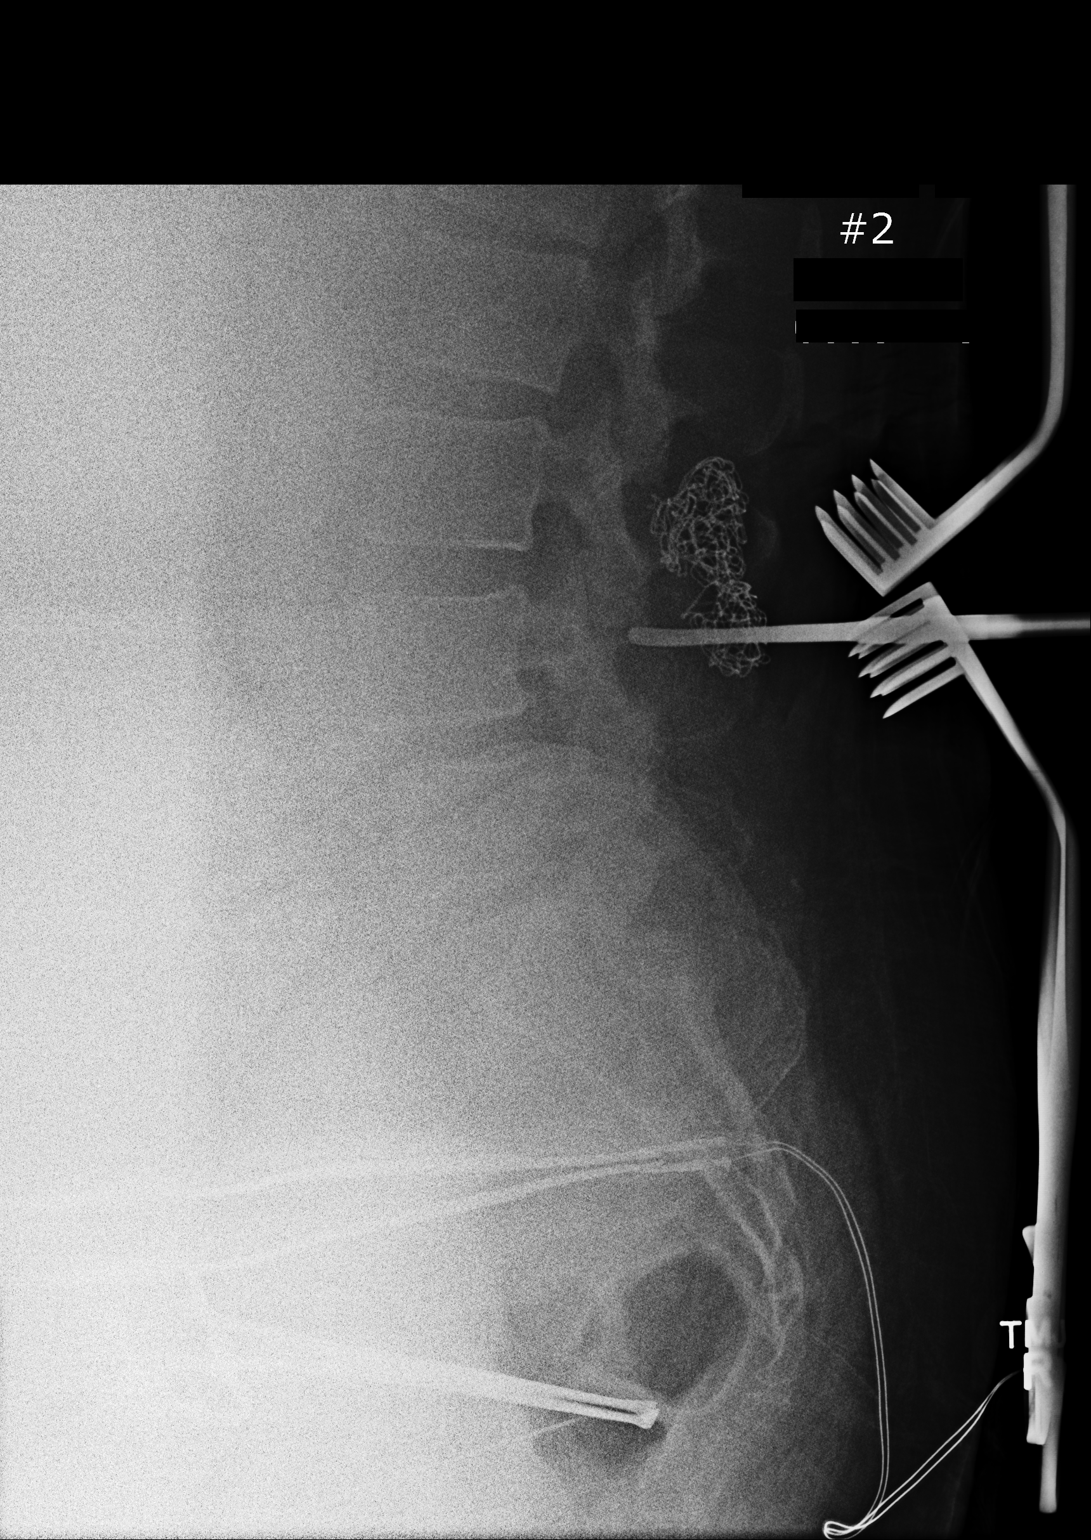

[1 of 1 positions shown; findings below may reference images not displayed]

FINDINGS: Metallic marker is noted posteriorly at the lower L4 level. No acute
bony abnormality.
IMPRESSION: Metallic marker noted posteriorly at the lower L4 level.

## 2016-01-01 IMAGING — RF DG C-ARM 61-120 MIN
1 series · 2 of 2 positions shown · non-contrast
Comparison: [DATE] lumbar spine radiographs

CLINICAL DATA: L4-5 PLIF

EXAM:
LUMBAR SPINE - 2-3 VIEW; DG C-ARM 61-120 MIN

[Series 1: run · 2 of 2 slices shown]
[im 1/2]
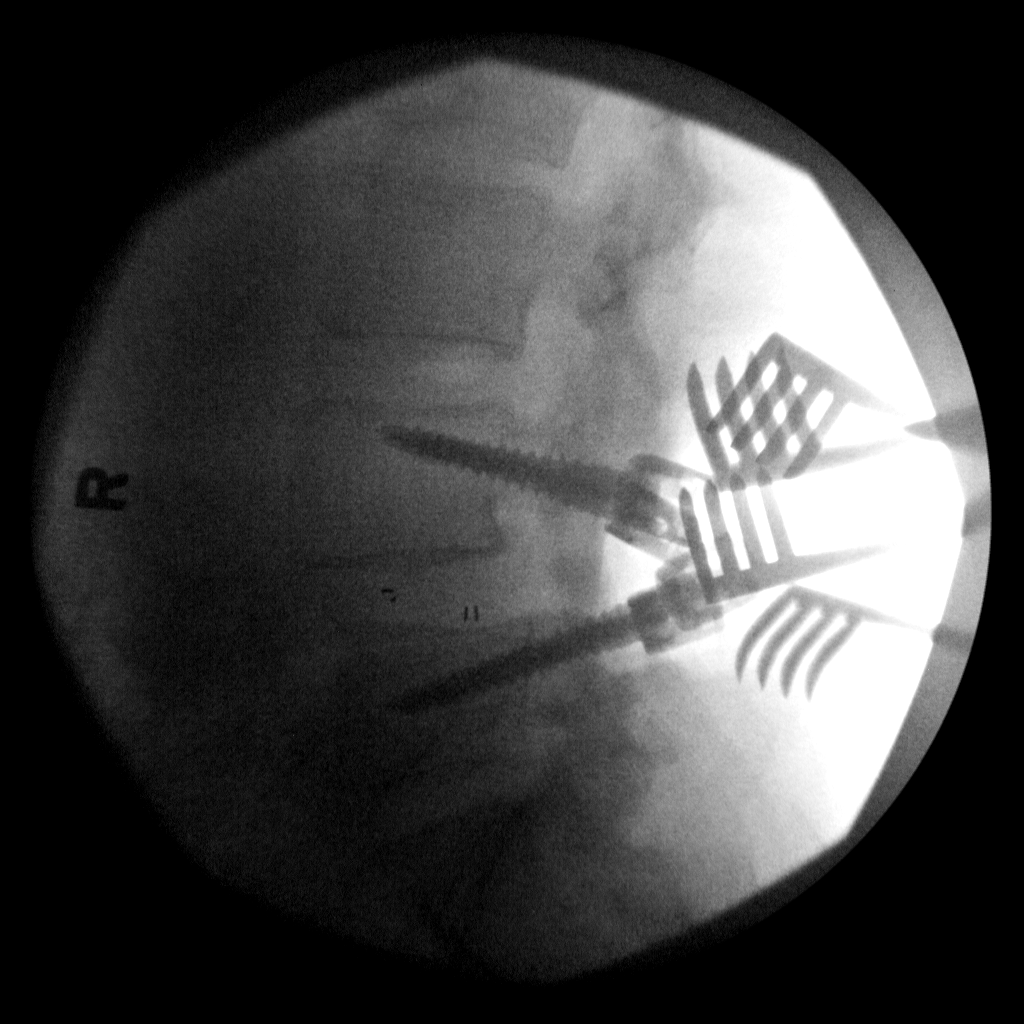
[im 2/2]
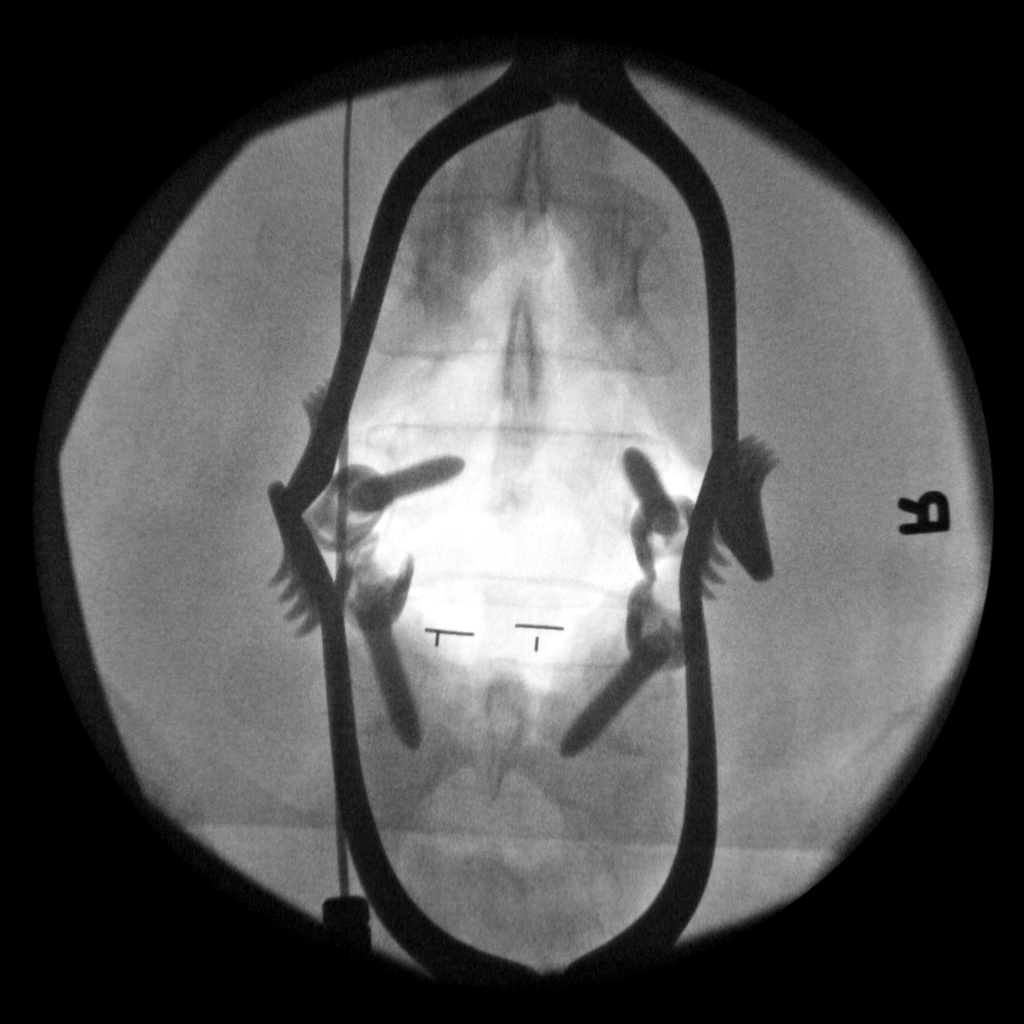

[2 of 2 positions shown; findings below may reference images not displayed]

FINDINGS: Fluoroscopy time 0 minutes 48 seconds. Two spot fluoroscopic
nondiagnostic intraoperative lumbar spine radiographs demonstrate
surgical changes from bilateral posterior lumbar spine fusion at
L4-5 with bone cage in the L4-5 disc space.
IMPRESSION: Intraoperative fluoroscopic guidance for L4-5 PLIF.

## 2016-01-01 IMAGING — CR DG LUMBAR SPINE 2-3V
1 series · 1 of 1 positions shown · non-contrast
Comparison: [DATE].

CLINICAL DATA: Lumbar spine surgery.

EXAM:
LUMBAR SPINE - 2-3 VIEW

[lat]
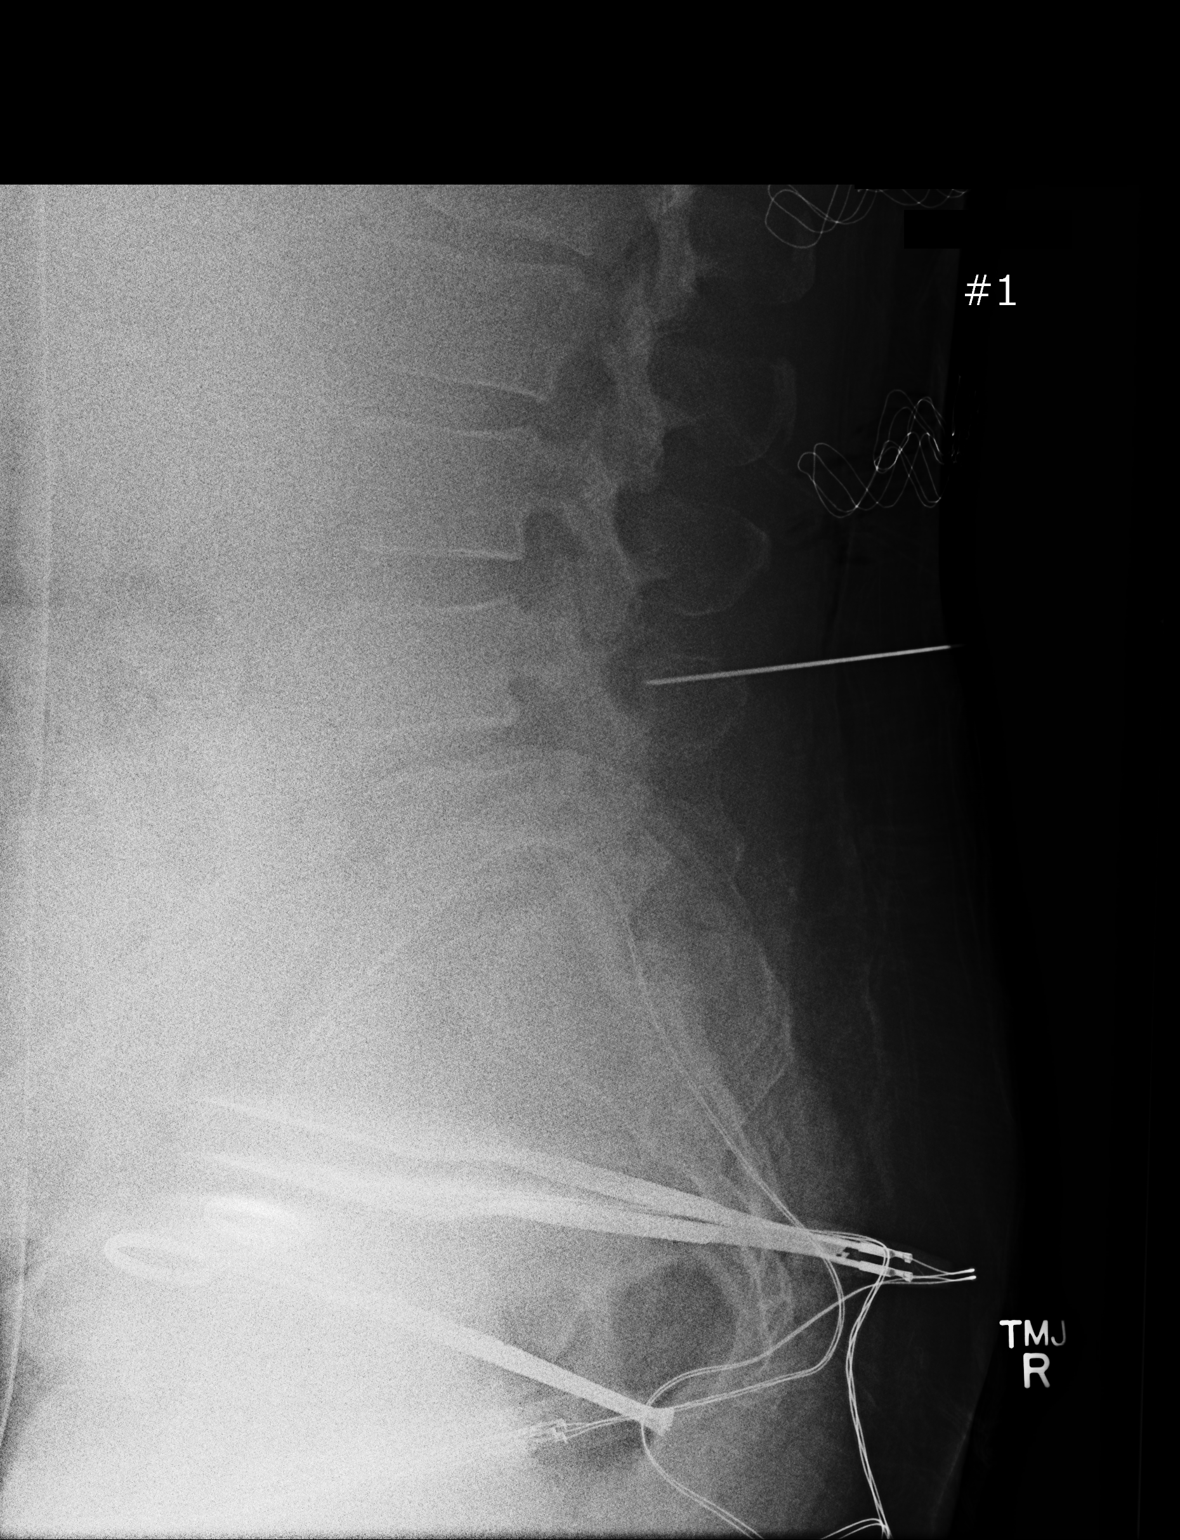

[1 of 1 positions shown; findings below may reference images not displayed]

FINDINGS: Metallic marker is noted posteriorly at the lower L4 level. No acute
bony abnormality.
IMPRESSION: Metallic marker noted posteriorly at the lower L4 level.

## 2016-01-01 SURGERY — POSTERIOR LUMBAR FUSION 1 LEVEL
Anesthesia: General | Site: Spine Lumbar

## 2016-01-01 MED ORDER — ONDANSETRON HCL 4 MG/2ML IJ SOLN
INTRAMUSCULAR | Status: DC | PRN
  Administered 2016-01-01: 4 mg via INTRAVENOUS

## 2016-01-01 MED ORDER — FLUOXETINE HCL 40 MG PO CAPS: 40.0000 mg | ORAL_CAPSULE | Freq: Every day | ORAL | Status: DC

## 2016-01-01 MED ORDER — MIDAZOLAM HCL 5 MG/5ML IJ SOLN
INTRAMUSCULAR | Status: DC | PRN
Start: 2016-01-01 — End: 2016-01-01
  Administered 2016-01-01: 2 mg via INTRAVENOUS

## 2016-01-01 MED ORDER — SODIUM CHLORIDE 0.9 % IR SOLN: Status: DC | PRN

## 2016-01-01 MED ORDER — ALUM & MAG HYDROXIDE-SIMETH 200-200-20 MG/5ML PO SUSP
30.0000 mL | Freq: Four times a day (QID) | ORAL | Status: DC | PRN
Start: 2016-01-01 — End: 2016-01-02

## 2016-01-01 MED ORDER — MENTHOL 3 MG MT LOZG: 1.0000 | LOZENGE | OROMUCOSAL | Status: DC | PRN

## 2016-01-01 MED ORDER — ARTIFICIAL TEARS OP OINT
TOPICAL_OINTMENT | OPHTHALMIC | Status: DC | PRN
  Administered 2016-01-01: 1 via OPHTHALMIC

## 2016-01-01 MED ORDER — ONDANSETRON HCL 4 MG/2ML IJ SOLN: 4.0000 mg | Freq: Four times a day (QID) | INTRAMUSCULAR | Status: DC | PRN

## 2016-01-01 MED ORDER — ALBUTEROL SULFATE HFA 108 (90 BASE) MCG/ACT IN AERS: 1.0000 | INHALATION_SPRAY | Freq: Four times a day (QID) | RESPIRATORY_TRACT | Status: DC | PRN

## 2016-01-01 MED ORDER — LACTATED RINGERS IV SOLN
INTRAVENOUS | Status: DC | PRN
  Administered 2016-01-01 (×2): via INTRAVENOUS

## 2016-01-01 MED ORDER — FLUTICASONE PROPIONATE 50 MCG/ACT NA SUSP
1.0000 | Freq: Every day | NASAL | Status: DC
  Filled 2016-01-01: qty 16

## 2016-01-01 MED ORDER — HEMOSTATIC AGENTS (NO CHARGE) OPTIME
TOPICAL | Status: DC | PRN
  Administered 2016-01-01: 1 via TOPICAL

## 2016-01-01 MED ORDER — SUGAMMADEX SODIUM 200 MG/2ML IV SOLN
INTRAVENOUS | Status: DC | PRN
  Administered 2016-01-01: 250 mg via INTRAVENOUS

## 2016-01-01 MED ORDER — FENTANYL CITRATE (PF) 100 MCG/2ML IJ SOLN
INTRAMUSCULAR | Status: DC | PRN
  Administered 2016-01-01: 50 ug via INTRAVENOUS
  Administered 2016-01-01: 150 ug via INTRAVENOUS
  Administered 2016-01-01 (×5): 50 ug via INTRAVENOUS

## 2016-01-01 MED ORDER — DIPHENHYDRAMINE HCL 50 MG/ML IJ SOLN
INTRAMUSCULAR | Status: AC
  Filled 2016-01-01: qty 1

## 2016-01-01 MED ORDER — BISACODYL 10 MG RE SUPP: 10.0000 mg | Freq: Every day | RECTAL | Status: DC | PRN

## 2016-01-01 MED ORDER — DIPHENHYDRAMINE HCL 25 MG PO TABS
25.0000 mg | ORAL_TABLET | Freq: Four times a day (QID) | ORAL | Status: DC | PRN
  Filled 2016-01-01: qty 1

## 2016-01-01 MED ORDER — BUPIVACAINE HCL (PF) 0.5 % IJ SOLN
INTRAMUSCULAR | Status: DC | PRN
  Administered 2016-01-01: 30 mL

## 2016-01-01 MED ORDER — ONDANSETRON HCL 4 MG PO TABS: 4.0000 mg | ORAL_TABLET | Freq: Four times a day (QID) | ORAL | Status: DC | PRN

## 2016-01-01 MED ORDER — CYCLOBENZAPRINE HCL 10 MG PO TABS
10.0000 mg | ORAL_TABLET | Freq: Three times a day (TID) | ORAL | Status: DC | PRN
  Administered 2016-01-01 – 2016-01-02 (×3): 10 mg via ORAL
  Filled 2016-01-01 (×3): qty 1

## 2016-01-01 MED ORDER — MIDAZOLAM HCL 2 MG/2ML IJ SOLN
INTRAMUSCULAR | Status: AC
  Filled 2016-01-01: qty 2

## 2016-01-01 MED ORDER — DIAZEPAM 5 MG/ML IJ SOLN
INTRAMUSCULAR | Status: AC
  Administered 2016-01-01: 2.5 mg via INTRAVENOUS
  Filled 2016-01-01: qty 2

## 2016-01-01 MED ORDER — OXYCODONE-ACETAMINOPHEN 5-325 MG PO TABS
1.0000 | ORAL_TABLET | ORAL | Status: DC | PRN
  Administered 2016-01-01 – 2016-01-02 (×7): 2 via ORAL
  Filled 2016-01-01 (×7): qty 2

## 2016-01-01 MED ORDER — SODIUM CHLORIDE 0.9 % IR SOLN
Status: DC | PRN
  Administered 2016-01-01 (×2): 500 mL

## 2016-01-01 MED ORDER — GABAPENTIN 600 MG PO TABS
1200.0000 mg | ORAL_TABLET | Freq: Every day | ORAL | Status: DC
  Administered 2016-01-01: 1200 mg via ORAL
  Filled 2016-01-01: qty 2

## 2016-01-01 MED ORDER — GENTAMICIN IN SALINE 1.6-0.9 MG/ML-% IV SOLN
80.0000 mg | INTRAVENOUS | Status: AC
Start: 2016-01-01 — End: 2016-01-01
  Administered 2016-01-01: 80 mg via INTRAVENOUS
  Filled 2016-01-01: qty 50

## 2016-01-01 MED ORDER — HYDROMORPHONE HCL 1 MG/ML IJ SOLN
0.2500 mg | INTRAMUSCULAR | Status: DC | PRN
  Administered 2016-01-01 (×4): 0.5 mg via INTRAVENOUS

## 2016-01-01 MED ORDER — MORPHINE SULFATE (PF) 4 MG/ML IV SOLN
4.0000 mg | INTRAVENOUS | Status: DC | PRN
  Administered 2016-01-01 – 2016-01-02 (×2): 4 mg via INTRAMUSCULAR
  Filled 2016-01-01 (×2): qty 1

## 2016-01-01 MED ORDER — ROCURONIUM BROMIDE 100 MG/10ML IV SOLN
INTRAVENOUS | Status: DC | PRN
Start: 2016-01-01 — End: 2016-01-01
  Administered 2016-01-01 (×2): 10 mg via INTRAVENOUS
  Administered 2016-01-01: 40 mg via INTRAVENOUS

## 2016-01-01 MED ORDER — LIDOCAINE HCL (CARDIAC) 20 MG/ML IV SOLN
INTRAVENOUS | Status: DC | PRN
  Administered 2016-01-01: 100 mg via INTRAVENOUS

## 2016-01-01 MED ORDER — PROPOFOL 10 MG/ML IV BOLUS
INTRAVENOUS | Status: DC | PRN
Start: 2016-01-01 — End: 2016-01-01
  Administered 2016-01-01: 150 mg via INTRAVENOUS

## 2016-01-01 MED ORDER — FENTANYL CITRATE (PF) 250 MCG/5ML IJ SOLN
INTRAMUSCULAR | Status: AC
  Filled 2016-01-01: qty 5

## 2016-01-01 MED ORDER — ROCURONIUM BROMIDE 50 MG/5ML IV SOLN
INTRAVENOUS | Status: AC
  Filled 2016-01-01: qty 1

## 2016-01-01 MED ORDER — ONDANSETRON HCL 4 MG/2ML IJ SOLN
INTRAMUSCULAR | Status: AC
  Filled 2016-01-01: qty 2

## 2016-01-01 MED ORDER — ACETAMINOPHEN 10 MG/ML IV SOLN
INTRAVENOUS | Status: AC
  Administered 2016-01-01: 1000 mg via INTRAVENOUS
  Filled 2016-01-01: qty 100

## 2016-01-01 MED ORDER — GABAPENTIN 600 MG PO TABS
600.0000 mg | ORAL_TABLET | Freq: Two times a day (BID) | ORAL | Status: DC
  Administered 2016-01-01 – 2016-01-02 (×3): 600 mg via ORAL
  Filled 2016-01-01 (×3): qty 1

## 2016-01-01 MED ORDER — PHENYLEPHRINE HCL 10 MG/ML IJ SOLN
10.0000 mg | INTRAVENOUS | Status: DC | PRN
  Administered 2016-01-01: 25 ug/min via INTRAVENOUS

## 2016-01-01 MED ORDER — THROMBIN 20000 UNITS EX KIT
PACK | CUTANEOUS | Status: DC | PRN
  Administered 2016-01-01: 20000 [IU] via TOPICAL

## 2016-01-01 MED ORDER — PHENYLEPHRINE 40 MCG/ML (10ML) SYRINGE FOR IV PUSH (FOR BLOOD PRESSURE SUPPORT)
PREFILLED_SYRINGE | INTRAVENOUS | Status: AC
  Filled 2016-01-01: qty 10

## 2016-01-01 MED ORDER — ACETAMINOPHEN 10 MG/ML IV SOLN
1000.0000 mg | Freq: Once | INTRAVENOUS | Status: AC
  Administered 2016-01-01: 1000 mg via INTRAVENOUS

## 2016-01-01 MED ORDER — ACETAMINOPHEN 650 MG RE SUPP: 650.0000 mg | RECTAL | Status: DC | PRN

## 2016-01-01 MED ORDER — HYDROXYZINE HCL 50 MG/ML IM SOLN: 50.0000 mg | INTRAMUSCULAR | Status: DC | PRN

## 2016-01-01 MED ORDER — ALBUTEROL SULFATE (2.5 MG/3ML) 0.083% IN NEBU: 2.5000 mg | INHALATION_SOLUTION | Freq: Four times a day (QID) | RESPIRATORY_TRACT | Status: DC | PRN

## 2016-01-01 MED ORDER — LIDOCAINE-EPINEPHRINE 1 %-1:100000 IJ SOLN
INTRAMUSCULAR | Status: DC | PRN
  Administered 2016-01-01: 20 mL

## 2016-01-01 MED ORDER — PROPOFOL 10 MG/ML IV BOLUS
INTRAVENOUS | Status: AC
Start: 2016-01-01 — End: 2016-01-01
  Filled 2016-01-01: qty 20

## 2016-01-01 MED ORDER — HYDROXYZINE HCL 25 MG PO TABS: 50.0000 mg | ORAL_TABLET | ORAL | Status: DC | PRN

## 2016-01-01 MED ORDER — DIPHENHYDRAMINE HCL 50 MG/ML IJ SOLN
INTRAMUSCULAR | Status: DC | PRN
  Administered 2016-01-01: 12.5 mg via INTRAVENOUS

## 2016-01-01 MED ORDER — MAGNESIUM HYDROXIDE 400 MG/5ML PO SUSP: 30.0000 mL | Freq: Every day | ORAL | Status: DC | PRN

## 2016-01-01 MED ORDER — ZOLPIDEM TARTRATE 5 MG PO TABS
5.0000 mg | ORAL_TABLET | Freq: Every evening | ORAL | Status: DC | PRN
  Administered 2016-01-01: 5 mg via ORAL
  Filled 2016-01-01: qty 1

## 2016-01-01 MED ORDER — KCL IN DEXTROSE-NACL 20-5-0.45 MEQ/L-%-% IV SOLN: INTRAVENOUS | Status: DC

## 2016-01-01 MED ORDER — DIAZEPAM 5 MG/ML IJ SOLN
2.5000 mg | Freq: Once | INTRAMUSCULAR | Status: AC
  Administered 2016-01-01: 2.5 mg via INTRAVENOUS

## 2016-01-01 MED ORDER — HYDROMORPHONE HCL 1 MG/ML IJ SOLN
INTRAMUSCULAR | Status: AC
  Administered 2016-01-01: 0.5 mg via INTRAVENOUS
  Filled 2016-01-01: qty 1

## 2016-01-01 MED ORDER — FLUOXETINE HCL 20 MG PO CAPS
40.0000 mg | ORAL_CAPSULE | Freq: Every day | ORAL | Status: DC
  Administered 2016-01-01 – 2016-01-02 (×2): 40 mg via ORAL
  Filled 2016-01-01 (×2): qty 2

## 2016-01-01 MED ORDER — SODIUM CHLORIDE 0.9% FLUSH
3.0000 mL | Freq: Two times a day (BID) | INTRAVENOUS | Status: DC
  Administered 2016-01-01 – 2016-01-02 (×3): 3 mL via INTRAVENOUS

## 2016-01-01 MED ORDER — DEXAMETHASONE SODIUM PHOSPHATE 10 MG/ML IJ SOLN
INTRAMUSCULAR | Status: DC | PRN
  Administered 2016-01-01: 10 mg via INTRAVENOUS

## 2016-01-01 MED ORDER — LAMOTRIGINE 150 MG PO TABS
150.0000 mg | ORAL_TABLET | Freq: Two times a day (BID) | ORAL | Status: DC
  Administered 2016-01-01 – 2016-01-02 (×2): 150 mg via ORAL
  Filled 2016-01-01 (×3): qty 1

## 2016-01-01 MED ORDER — PHENOL 1.4 % MT LIQD: 1.0000 | OROMUCOSAL | Status: DC | PRN

## 2016-01-01 MED ORDER — HYDROMORPHONE HCL 1 MG/ML IJ SOLN
INTRAMUSCULAR | Status: AC
  Filled 2016-01-01: qty 1

## 2016-01-01 MED ORDER — ACETAMINOPHEN 325 MG PO TABS: 650.0000 mg | ORAL_TABLET | ORAL | Status: DC | PRN

## 2016-01-01 MED ORDER — SODIUM CHLORIDE 0.9% FLUSH: 3.0000 mL | INTRAVENOUS | Status: DC | PRN

## 2016-01-01 MED ORDER — HYDROCODONE-ACETAMINOPHEN 5-325 MG PO TABS: 1.0000 | ORAL_TABLET | ORAL | Status: DC | PRN

## 2016-01-01 SURGICAL SUPPLY — 75 items
ADH SKN CLS APL DERMABOND .7 (GAUZE/BANDAGES/DRESSINGS) ×2
ADH SKN CLS LQ APL DERMABOND (GAUZE/BANDAGES/DRESSINGS) ×3
BAG DECANTER FOR FLEXI CONT (MISCELLANEOUS) ×3 IMPLANT
BLADE CLIPPER SURG (BLADE) IMPLANT
BRUSH SCRUB EZ PLAIN DRY (MISCELLANEOUS) ×2 IMPLANT
BUR ACRON 5.0MM COATED (BURR) ×2 IMPLANT
BUR MATCHSTICK NEURO 3.0 LAGG (BURR) ×2 IMPLANT
CANISTER SUCT 3000ML PPV (MISCELLANEOUS) ×2 IMPLANT
CAP LCK SPNE (Orthopedic Implant) ×4 IMPLANT
CAP LOCK SPINE RADIUS (Orthopedic Implant) ×4 IMPLANT
CAP LOCKING (Orthopedic Implant) ×8 IMPLANT
CONT SPEC 4OZ CLIKSEAL STRL BL (MISCELLANEOUS) ×2 IMPLANT
COVER BACK TABLE 60X90IN (DRAPES) ×2 IMPLANT
DERMABOND ADHESIVE PROPEN (GAUZE/BANDAGES/DRESSINGS) ×3
DERMABOND ADVANCED (GAUZE/BANDAGES/DRESSINGS) ×2
DERMABOND ADVANCED .7 DNX12 (GAUZE/BANDAGES/DRESSINGS) ×2 IMPLANT
DERMABOND ADVANCED .7 DNX6 (GAUZE/BANDAGES/DRESSINGS) IMPLANT
DRAPE C-ARM 42X72 X-RAY (DRAPES) ×4 IMPLANT
DRAPE LAPAROTOMY 100X72X124 (DRAPES) ×2 IMPLANT
DRAPE POUCH INSTRU U-SHP 10X18 (DRAPES) ×2 IMPLANT
DRAPE PROXIMA HALF (DRAPES) IMPLANT
DRSG EMULSION OIL 3X3 NADH (GAUZE/BANDAGES/DRESSINGS) IMPLANT
ELECT REM PT RETURN 9FT ADLT (ELECTROSURGICAL) ×2
ELECTRODE REM PT RTRN 9FT ADLT (ELECTROSURGICAL) ×1 IMPLANT
GAUZE SPONGE 4X4 12PLY STRL (GAUZE/BANDAGES/DRESSINGS) ×2 IMPLANT
GAUZE SPONGE 4X4 16PLY XRAY LF (GAUZE/BANDAGES/DRESSINGS) IMPLANT
GLOVE BIOGEL PI IND STRL 7.0 (GLOVE) IMPLANT
GLOVE BIOGEL PI IND STRL 7.5 (GLOVE) IMPLANT
GLOVE BIOGEL PI IND STRL 8 (GLOVE) ×2 IMPLANT
GLOVE BIOGEL PI INDICATOR 7.0 (GLOVE) ×3
GLOVE BIOGEL PI INDICATOR 7.5 (GLOVE) ×1
GLOVE BIOGEL PI INDICATOR 8 (GLOVE) ×2
GLOVE ECLIPSE 7.5 STRL STRAW (GLOVE) ×4 IMPLANT
GLOVE EXAM NITRILE LRG STRL (GLOVE) IMPLANT
GLOVE EXAM NITRILE MD LF STRL (GLOVE) IMPLANT
GLOVE EXAM NITRILE XL STR (GLOVE) IMPLANT
GLOVE EXAM NITRILE XS STR PU (GLOVE) IMPLANT
GLOVE SS BIOGEL STRL SZ 7 (GLOVE) IMPLANT
GLOVE SUPERSENSE BIOGEL SZ 7 (GLOVE) ×1
GOWN STRL REUS W/ TWL LRG LVL3 (GOWN DISPOSABLE) ×2 IMPLANT
GOWN STRL REUS W/ TWL XL LVL3 (GOWN DISPOSABLE) ×1 IMPLANT
GOWN STRL REUS W/TWL 2XL LVL3 (GOWN DISPOSABLE) IMPLANT
GOWN STRL REUS W/TWL LRG LVL3 (GOWN DISPOSABLE) ×4
GOWN STRL REUS W/TWL XL LVL3 (GOWN DISPOSABLE) ×2
KIT BASIN OR (CUSTOM PROCEDURE TRAY) ×2 IMPLANT
KIT INFUSE SMALL (Orthopedic Implant) ×2 IMPLANT
KIT ROOM TURNOVER OR (KITS) ×2 IMPLANT
MILL MEDIUM DISP (BLADE) ×1 IMPLANT
NDL SPNL 22GX3.5 QUINCKE BK (NEEDLE) ×2 IMPLANT
NEEDLE BONE MARROW 8GAX6 (NEEDLE) ×1 IMPLANT
NEEDLE SPNL 18GX3.5 QUINCKE PK (NEEDLE) ×2 IMPLANT
NEEDLE SPNL 22GX3.5 QUINCKE BK (NEEDLE) ×2 IMPLANT
NS IRRIG 1000ML POUR BTL (IV SOLUTION) ×2 IMPLANT
PACK LAMINECTOMY NEURO (CUSTOM PROCEDURE TRAY) ×2 IMPLANT
PAD ARMBOARD 7.5X6 YLW CONV (MISCELLANEOUS) ×8 IMPLANT
PATTIES SURGICAL .5 X.5 (GAUZE/BANDAGES/DRESSINGS) IMPLANT
PATTIES SURGICAL .5 X1 (DISPOSABLE) IMPLANT
PATTIES SURGICAL 1X1 (DISPOSABLE) IMPLANT
PEEK PLIF AVS 13X20X4 (Peek) ×2 IMPLANT
ROD RADIUS 35MM (Rod) ×2 IMPLANT
SCREW 5.75X40M (Screw) ×2 IMPLANT
SCREW 5.75X45MM (Screw) ×2 IMPLANT
SPONGE LAP 4X18 X RAY DECT (DISPOSABLE) IMPLANT
SPONGE NEURO XRAY DETECT 1X3 (DISPOSABLE) IMPLANT
SPONGE SURGIFOAM ABS GEL 100 (HEMOSTASIS) ×2 IMPLANT
STRIP BIOACTIVE VITOSS 25X100X (Neuro Prosthesis/Implant) ×2 IMPLANT
SUT VIC AB 1 CT1 18XBRD ANBCTR (SUTURE) ×2 IMPLANT
SUT VIC AB 1 CT1 8-18 (SUTURE) ×6
SUT VIC AB 2-0 CP2 18 (SUTURE) ×5 IMPLANT
SYR 3ML LL SCALE MARK (SYRINGE) ×8 IMPLANT
SYR CONTROL 10ML LL (SYRINGE) ×2 IMPLANT
TOWEL OR 17X24 6PK STRL BLUE (TOWEL DISPOSABLE) ×2 IMPLANT
TOWEL OR 17X26 10 PK STRL BLUE (TOWEL DISPOSABLE) ×2 IMPLANT
TRAY FOLEY W/METER SILVER 16FR (SET/KITS/TRAYS/PACK) ×2 IMPLANT
WATER STERILE IRR 1000ML POUR (IV SOLUTION) ×2 IMPLANT

## 2016-01-01 NOTE — Anesthesia Postprocedure Evaluation (Signed)
Anesthesia Post Note  Patient: Dana Holland  Procedure(s) Performed: Procedure(s) (LRB): Lumbar Four-five decompression, posterior lumbar interbody fusion with posterior lateral arthrodesis (N/A)  Patient location during evaluation: PACU Anesthesia Type: General Level of consciousness: sedated Pain management: satisfactory to patient Vital Signs Assessment: post-procedure vital signs reviewed and stable Respiratory status: spontaneous breathing Cardiovascular status: stable Anesthetic complications: no Comments: Sleeping in PACU    Last Vitals:  Filed Vitals:   01/01/16 1300 01/01/16 1315  BP:    Pulse: 100 103  Temp:    Resp: 21 19    Last Pain:  Filed Vitals:   01/01/16 1316  PainSc: Asleep                 Juri Dinning EDWARD

## 2016-01-01 NOTE — Op Note (Signed)
01/01/2016  11:51 AM  PATIENT:  Dana Holland  55 y.o. 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, lumbar spondylosis, lumbar degenerative disc disease  PROCEDURE:  Procedure(s):  Bilateral L4-5 lumbar decompression including laminectomy, facetectomy, and foraminotomies for decompression of the stenotic compression of the exiting L4 and L5 nerve roots bilaterally, with decompression beyond that required for interbody arthrodesis; bilateral L4-5 posterior lumbar interbody arthrodesis with AVS interbody implants, Vitoss BA with bone marrow aspirate, and infuse; bilateral L4-5 posterior lateral arthrodesis with nonsegmental radius posterior instrumentation, locally harvested morcellized autograft, Vitoss BA with bone marrow aspirate, and infuse  SURGEON:  Surgeon(s): Shirlean Kellyobert Nudelman, MD Loura HaltBenjamin Jared Ditty, MD  ASSISTANTS:  Johnnye LanaBen Ditty, MD  ANESTHESIA:   general  EBL:  Total I/O In: 1600 [I.V.:1600] Out: 475 [Urine:400; Blood:75]  BLOOD ADMINISTERED:none  CELL SAVER GIVEN:  Cell Saver technician felt that there was insufficient blood loss to process to collect the blood  COUNT:  Correct per nursing staff  DICTATION: Patient is brought to the operating room placed under general endotracheal anesthesia. The patient was turned to prone position the lumbar region was prepped with Betadine soap and solution and draped in a sterile fashion. The midline was infiltrated with local anesthesia with epinephrine. A localizing x-ray was taken and then a midline incision was made carried down through the subcutaneous tissue, bipolar cautery and electrocautery were used to maintain hemostasis. Dissection was carried down to the  lumbar fascia. The fascia was incised bilaterally and the paraspinal muscles were dissected with a spinous process and lamina in a subperiosteal fashion. Another x-ray was taken for localization and the L4-5 level was localized. Dissection was then carried out laterally over the facet complex and the transverse processes of L4 and L5 were exposed and decorticated.   Decompression was begun, performing a bilateral L4 and L5 lumbar laminectomy using double-action rongeurs, the high-speed drill, and Kerrison punches. Dissection was carried out laterally including facetectomy and foraminotomies with decompression of the stenotic compression of the L4 and L5 nerve roots. Once the decompression stenotic compression of the thecal sac and exiting nerve roots was completed we proceeded with the posterior lumbar interbody arthrodesis. The annulus was incised bilaterally and the disc space entered. A thorough discectomy was performed using pituitary rongeurs and curettes. Once the discectomy was completed we began to prepare the endplate surfaces removing the cartilaginous endplates surface with paddle curettes. We then measured the height of the intervertebral disc space. We selected 13 x 20 x 4 AVS peek interbody implants.  The C-arm fluoroscope was then draped and brought in the field and we identified the pedicle entry points bilaterally at the L4 and L5 levels. Each of the 4 pedicles was probed, we aspirated bone marrow aspirate from the vertebral bodies, this was injected over two 10 cc strips of Vitoss BA. Then each of the pedicles was examined with the ball probe good bony surfaces were found and no bony cuts were found. Each of the pedicles was then tapped with a 5.25 mm tap, again examined with the ball probe good threading was found and no bony cuts were found. We then placed 5.75 by 40 millimeter screws bilaterally at the L4 level and 5.75 x 45 mm screws bilaterally at the L5 level.  We then packed the AVS  peek  interbody implants with Vitoss BA with bone marrow aspirate and infuse, and then placed the first implant and on the right side, carefully retracting the thecal sac and nerve root medially. We then went back to the left side and packed the midline with additional Vitoss BA with bone marrow aspirate and infuse, and then placed a second implant and on the left side again retracting the thecal sac and nerve root medially. Additional Vitoss BA with bone marrow aspirate and infuse was packed lateral to the implants.  We then packed the lateral gutter over the transverse processes and intertransverse space with locally harvested morcellized autograft, Vitoss BA with bone marrow aspirate, and infuse. We then selected pre-lordosed 35mm rods, they were placed within the screw heads and secured with locking caps once all 4 locking caps were placed final tightening was performed against a counter torque.  The wound had been irrigated multiple times during the procedure with saline solution and bacitracin solution, good hemostasis was established with a combination of bipolar cautery and Gelfoam with thrombin. Once good hemostasis was confirmed we proceeded with closure paraspinal muscles deep fascia and Scarpa's fascia were closed with interrupted undyed 1 Vicryl sutures the subcutaneous and subcuticular closed with interrupted inverted 2-0 undyed Vicryl sutures the skin edges were approximated with Dermabond. We wound was dressed with sterile gauze and Hypafix.  Following surgery the patient was turned back to the supine position to be reversed and the anesthetic extubated and transferred to the recovery room for further care.   PLAN OF CARE: Admit for overnight observation  PATIENT DISPOSITION:  PACU - hemodynamically stable.   Delay start of Pharmacological VTE agent (>24hrs) due to surgical blood loss or risk of bleeding:  yes

## 2016-01-01 NOTE — Anesthesia Procedure Notes (Signed)
Procedure Name: Intubation Date/Time: 01/01/2016 7:32 AM Performed by: Little IshikawaMERCER, Alena Blankenbeckler L Pre-anesthesia Checklist: Patient identified, Timeout performed, Emergency Drugs available, Suction available and Patient being monitored Patient Re-evaluated:Patient Re-evaluated prior to inductionOxygen Delivery Method: Circle system utilized Preoxygenation: Pre-oxygenation with 100% oxygen Intubation Type: IV induction Ventilation: Mask ventilation without difficulty Laryngoscope Size: Mac and 3 Grade View: Grade I Tube type: Oral Tube size: 7.0 mm Number of attempts: 1 Airway Equipment and Method: Stylet Placement Confirmation: ETT inserted through vocal cords under direct vision,  positive ETCO2 and breath sounds checked- equal and bilateral Secured at: 22 cm Tube secured with: Tape Dental Injury: Teeth and Oropharynx as per pre-operative assessment

## 2016-01-01 NOTE — Progress Notes (Signed)
Filed Vitals:   01/01/16 1315 01/01/16 1330 01/01/16 1353 01/01/16 1722  BP: 130/67 121/70 117/54 126/60  Pulse: 103 100 96 96  Temp:  97.3 F (36.3 C) 98.5 F (36.9 C) 98.1 F (36.7 C)  TempSrc:      Resp: 19 13 18    SpO2: 97% 98% 97% 96%    Patient sitting up in bed, has ambulated in the halls. Mild to moderate incisional discomfort. Dressing clean and dry. Foley DC'd, nursing staff monitoring voiding function.  Plan: Encouraged to ambulate again this evening, and further in the morning. Continue to progress through postoperative recovery.  Hewitt ShortsNUDELMAN,ROBERT W, MD 01/01/2016, 6:19 PM

## 2016-01-01 NOTE — Transfer of Care (Signed)
Immediate Anesthesia Transfer of Care Note  Patient: Dana InaConnie Holland  Procedure(s) Performed: Procedure(s): Lumbar Four-five decompression, posterior lumbar interbody fusion with posterior lateral arthrodesis (N/A)  Patient Location: PACU  Anesthesia Type:General  Level of Consciousness: awake and alert   Airway & Oxygen Therapy: Patient Spontanous Breathing and Patient connected to nasal cannula oxygen  Post-op Assessment: Report given to RN, Post -op Vital signs reviewed and stable and Patient moving all extremities X 4  Post vital signs: Reviewed and stable  Last Vitals:  Filed Vitals:   01/01/16 0615 01/01/16 1201  BP: 167/86   Pulse: 98   Temp: 37.1 C 36.4 C  Resp: 20     Last Pain:  Filed Vitals:   01/01/16 1207  PainSc: 8       Patients Stated Pain Goal: 3 (01/01/16 [id number])  Complications: No apparent anesthesia complications

## 2016-01-01 NOTE — H&P (Signed)
Subjective: Patient is a 55 y.o. right handed white female who is admitted for treatment of lumbar stenosis and grade 2 spondylolisthesis.  Patient's been having difficulties with low back pain radiating into the lower extremities bilaterally, consistent with neurogenic claudication. She's been found to have severe multifactorial lumbar stenosis, significantly contributed to by a dynamic grade 2 degenerative spondylolisthesis of L4 on L5. She is admitted now for L4-5 decompression including laminectomy, facetectomy, and foraminotomy, and L4-5 stabilization via posterior lumbar interbody arthrodesis with interbody implants and bone graft and posterior lateral arthrodesis with posterior instrumentation and bone graft.   Patient Active Problem List   Diagnosis Date Noted  . HNP (herniated nucleus pulposus), cervical 08/03/2015   Past Medical History  Diagnosis Date  . Asthma   . COPD (chronic obstructive pulmonary disease) (HCC)   . Seasonal allergies   . Insomnia     takes Ambien nightly  . Fibromyalgia   . Chronic back pain   . Headache   . DDD (degenerative disc disease)   . DDD (degenerative disc disease), lumbar   . DDD (degenerative disc disease), lumbar   . DJD (degenerative joint disease)   . Anxiety   . Depression     takes Prozac daily  . Bipolar disorder (HCC)     takes Lamictal daily  . Shortness of breath dyspnea     with exertion  . Lumbar stenosis with neurogenic claudication     Past Surgical History  Procedure Laterality Date  . Breast surgery    . Cholecystectomy    . Abdominal hysterectomy    . Tubal ligation    . Abdominal exploration surgery    . Knee surgery    . Anterior cervical decomp/discectomy fusion N/A 08/03/2015    Procedure: ACDF C5-C6  C6-C7;  Surgeon: Shirlean Kellyobert Nudelman, MD;  Location: [facility];  Service: Neurosurgery;  Laterality: N/A;  ACDF C5-C6  C6-C7  . Carpal tunnel release Right 11/29/2015    Procedure: CARPAL TUNNEL RELEASE-Right;   Surgeon: Shirlean Kellyobert Nudelman, MD;  Location: [facility];  Service: Neurosurgery;  Laterality: Right;  CARPAL TUNNEL RELEASE-Right  . Dilation and curettage of uterus    . Multiple tooth extractions      Prescriptions prior to admission  Medication Sig Dispense Refill Last Dose  . albuterol (PROVENTIL HFA;VENTOLIN HFA) 108 (90 Base) MCG/ACT inhaler Inhale 1 puff into the lungs every 6 (six) hours as needed for wheezing or shortness of breath.   01/01/2016 at Unknown time  . aspirin 325 MG EC tablet Take 325 mg by mouth daily as needed for pain.   Past Month at Unknown time  . cyclobenzaprine (FLEXERIL) 10 MG tablet Take 0.5-1 tablets (5-10 mg total) by mouth 3 (three) times daily as needed for muscle spasms. 50 tablet 0 12/31/2015 at Unknown time  . diphenhydrAMINE (BENADRYL) 25 MG tablet Take 25 mg by mouth every 6 (six) hours as needed for allergies.   Past Week at Unknown time  . FLUoxetine (PROZAC) 40 MG capsule Take 40 mg by mouth daily.   12/31/2015 at Unknown time  . fluticasone (FLONASE) 50 MCG/ACT nasal spray Place 1 spray into both nostrils daily.   01/01/2016 at Unknown time  . gabapentin (NEURONTIN) 600 MG tablet Take 600-1,200 mg by mouth 3 (three) times daily. 600 mg twice a day and 1200 mg at bedtime  0 01/01/2016 at Unknown time  . HYDROcodone-acetaminophen (NORCO) 10-325 MG tablet Take 1 tablet by mouth every 6 (six) hours as needed  for moderate pain.    01/01/2016 at Unknown time  . lamoTRIgine (LAMICTAL) 150 MG tablet Take 150 mg by mouth 2 (two) times daily.   12/31/2015 at Unknown time  . naproxen sodium (ANAPROX) 220 MG tablet Take 440 mg by mouth daily as needed (for pain).   Past Month at Unknown time  . zolpidem (AMBIEN CR) 6.25 MG CR tablet Take 6.25 mg by mouth at bedtime.   12/31/2015 at Unknown time   Allergies  Allergen Reactions  . Wellbutrin [Bupropion] Hives, Shortness Of Breath, Swelling and Rash  . Cymbalta [Duloxetine Hcl] Other (See Comments)    Unknown  . Paroxetine  Hcl Other (See Comments)    Felt like bugs biting all over  . Compazine [Prochlorperazine Edisylate] Other (See Comments)    Felt like pt was trapped, hallucinations  . Effexor [Venlafaxine] Other (See Comments)    Unknown reaction  . Imitrex [Sumatriptan] Other (See Comments)    Blood vessels in neck felt like they were going to burst, potential stroke  . Keflex [Cephalexin] Hives and Swelling  . Remeron [Mirtazapine] Other (See Comments)    Chest pain  . Sulfa Antibiotics Hives and Swelling  . Voltaren [Diclofenac Sodium] Other (See Comments)    Severe intestinal bleeding, stomach in knots  . Ibuprofen Hives, Swelling and Rash  . Nsaids Hives, Swelling and Rash  . Toradol [Ketorolac Tromethamine] Hives, Swelling and Rash  . Tramadol Other (See Comments)    Severe headache/migraine  . Vioxx [Rofecoxib] Hives, Swelling and Rash    Social History  Substance Use Topics  . Smoking status: Former Smoker -- 0.25 packs/day for 40 years    Types: Cigarettes, E-cigarettes    Quit date: 04/30/2015  . Smokeless tobacco: Never Used     Comment: currently uses Vapor 12/25/15  . Alcohol Use: No    Family History  Problem Relation Age of Onset  . Heart disease Mother   . Hypertension Father   . Heart disease Father   . Diabetes Father   . Hypertension Other   . Heart disease Other      Review of Systems A comprehensive review of systems was negative.  Objective: Vital signs in last 24 hours: Temp:  [98.7 F (37.1 C)] 98.7 F (37.1 C) (06/12 0615) Pulse Rate:  [98] 98 (06/12 0615) Resp:  [20] 20 (06/12 0615) BP: (167)/(86) 167/86 mmHg (06/12 0615) SpO2:  [97 %] 97 % (06/12 0615)  EXAM: Patient is well-developed well-nourished white female in no acute distress. Lungs are clear to auscultation , the patient has symmetrical respiratory excursion. Heart has a regular rate and rhythm normal S1 and S2 no murmur.   Abdomen is soft nontender nondistended bowel sounds are present.  Extremity examination shows no clubbing cyanosis or edema. Motor examination shows 5 over 5 strength in the lower extremities including the iliopsoas quadriceps dorsiflexor extensor hallicus  longus and plantar flexor bilaterally. Sensation is intact to pinprick in the distal lower extremities. Reflexes are symmetrical bilaterally. No pathologic reflexes are present. Patient has a normal gait and stance.   Data Review:CBC    Component Value Date/Time   WBC 11.7* 12/25/2015 1051   RBC 4.44 12/25/2015 1051   HGB 14.5 12/25/2015 1051   HCT 43.3 12/25/2015 1051   PLT 280 12/25/2015 1051   MCV 97.5 12/25/2015 1051   MCH 32.7 12/25/2015 1051   MCHC 33.5 12/25/2015 1051   RDW 13.5 12/25/2015 1051   LYMPHSABS 3.7 11/11/2009 2142   MONOABS  1.1* 11/11/2009 2142   EOSABS 0.3 11/11/2009 2142   BASOSABS 0.3* 11/11/2009 2142                          BMET    Component Value Date/Time   NA 139 12/25/2015 1051   K 4.2 12/25/2015 1051   CL 107 12/25/2015 1051   CO2 25 12/25/2015 1051   GLUCOSE 193* 12/25/2015 1051   BUN 10 12/25/2015 1051   CREATININE 0.84 12/25/2015 1051   CALCIUM 9.4 12/25/2015 1051   GFRNONAA >60 12/25/2015 1051   GFRAA >60 12/25/2015 1051     Assessment/Plan: Patient with severe lumbar stenosis injury to by a grade 2 dynamic L4-L5 degenerative spondylolisthesis who is admitted for lumbar decompression and stabilization.  I've discussed with the patient the nature of his condition, the nature the surgical procedure, the typical length of surgery, hospital stay, and overall recuperation, the limitations postoperatively, and risks of surgery. I discussed risks including risks of infection, bleeding, possibly need for transfusion, the risk of nerve root dysfunction with pain, weakness, numbness, or paresthesias, the risk of dural tear and CSF leakage and possible need for further surgery, the risk of failure of the arthrodesis and possibly for further surgery, the risk of  anesthetic complications including myocardial infarction, stroke, pneumonia, and death. We discussed the need for postoperative immobilization in a lumbar brace. Understanding all this the patient does wish to proceed with surgery and is admitted for such.     Hewitt Shorts, MD 01/01/2016 7:07 AM

## 2016-01-02 MED ORDER — OXYCODONE-ACETAMINOPHEN 5-325 MG PO TABS: 1.0000 | ORAL_TABLET | Freq: Four times a day (QID) | ORAL | Status: DC | PRN

## 2016-01-02 MED ORDER — NAPROXEN 500 MG PO TABS
500.0000 mg | ORAL_TABLET | Freq: Two times a day (BID) | ORAL | Status: DC
  Administered 2016-01-02 (×2): 500 mg via ORAL
  Filled 2016-01-02 (×3): qty 1

## 2016-01-02 MED ORDER — HYDROCODONE-ACETAMINOPHEN 10-325 MG PO TABS: 1.0000 | ORAL_TABLET | ORAL | Status: DC | PRN

## 2016-01-02 MED FILL — Heparin Sodium (Porcine) Inj 1000 Unit/ML: INTRAMUSCULAR | Qty: 30 | Status: AC

## 2016-01-02 MED FILL — Sodium Chloride IV Soln 0.9%: INTRAVENOUS | Qty: 1000 | Status: AC

## 2016-01-02 NOTE — Discharge Summary (Signed)
Physician Discharge Summary  Patient ID: Dana InaConnie Holland MRN: [id number] DOB/AGE: November 05, 1960 55 y.o.  Admit date: 01/01/2016 Discharge date: 01/02/2016  Admission Diagnoses:  L4-5 lumbar stenosis with neurogenic claudication, L4-5 grade 2 dynamic degenerative spondylolisthesis, lumbar spondylosis, lumbar degenerative disc disease  Discharge Diagnoses:  L4-5 lumbar stenosis with neurogenic claudication, L4-5 grade 2 dynamic degenerative spondylolisthesis, lumbar spondylosis, lumbar degenerative disc disease Active Problems:   Lumbar stenosis with neurogenic claudication   Discharged Condition: good  Hospital Course: She was admitted, underwent L4-5 lumbar decompression and arthrodesis. Postoperatively she has progressively mobilize. She is ambulating in the halls. She is voiding. Her dressing was removed, the some mild bruising in the soft tissues adjacent to the incision but the incision itself is healing nicely. There is no drainage or erythema. She is asking to be discharged to home. She has been given instructions regarding wound care and activities following discharge. She is to follow-up with me in 3 weeks at the office.  Discharge Exam: Blood pressure 149/80, pulse 102, temperature 98.1 F (36.7 C), temperature source Oral, resp. rate 18, SpO2 97 %.  Disposition: 01-Home or Self Care     Medication List    TAKE these medications        albuterol 108 (90 Base) MCG/ACT inhaler  Commonly known as:  PROVENTIL HFA;VENTOLIN HFA  Inhale 1 puff into the lungs every 6 (six) hours as needed for wheezing or shortness of breath.     aspirin 325 MG EC tablet  Take 325 mg by mouth daily as needed for pain.     cyclobenzaprine 10 MG tablet  Commonly known as:  FLEXERIL  Take 0.5-1 tablets (5-10 mg total) by mouth 3 (three) times daily as needed for muscle spasms.     diphenhydrAMINE 25 MG tablet  Commonly known as:  BENADRYL  Take 25 mg by mouth every 6 (six) hours as needed for  allergies.     FLUoxetine 40 MG capsule  Commonly known as:  PROZAC  Take 40 mg by mouth daily.     fluticasone 50 MCG/ACT nasal spray  Commonly known as:  FLONASE  Place 1 spray into both nostrils daily.     gabapentin 600 MG tablet  Commonly known as:  NEURONTIN  Take 600-1,200 mg by mouth 3 (three) times daily. 600 mg twice a day and 1200 mg at bedtime     HYDROcodone-acetaminophen 10-325 MG tablet  Commonly known as:  NORCO  Take 1 tablet by mouth every 6 (six) hours as needed for moderate pain.     lamoTRIgine 150 MG tablet  Commonly known as:  LAMICTAL  Take 150 mg by mouth 2 (two) times daily.     naproxen sodium 220 MG tablet  Commonly known as:  ANAPROX  Take 440 mg by mouth daily as needed (for pain).     oxyCODONE-acetaminophen 5-325 MG tablet  Commonly known as:  PERCOCET/ROXICET  Take 1-2 tablets by mouth 4 (four) times daily as needed (pain).     zolpidem 6.25 MG CR tablet  Commonly known as:  AMBIEN CR  Take 6.25 mg by mouth at bedtime.         SignedHewitt Shorts: NUDELMAN,ROBERT W 01/02/2016, 6:22 PM

## 2016-01-02 NOTE — Progress Notes (Signed)
Discharged instructions/education/Rx given to patient with husband at bedside and they both verbalized understanding. No swelling, no drainage, no redness noted on incision site. Patient walking well and pain is mild to moderate per patient. D/C via wheelchair.

## 2016-01-02 NOTE — Discharge Instructions (Signed)

## 2016-01-02 NOTE — Progress Notes (Signed)
Filed Vitals:   01/01/16 1945 01/01/16 2328 01/02/16 0350 01/02/16 0742  BP: 127/66 127/64 102/51 115/59  Pulse: 102 101 103 100  Temp: 98.4 F (36.9 C) 98.1 F (36.7 C) 99.2 F (37.3 C)   TempSrc: Axillary Axillary Oral   Resp: 18 20 18 18   SpO2: 97% 96% 96% 99%    Patient resting in bed, significant pain in the incisional area as well as his lower extremities with walking. Dressing clean and dry. Has already walked in the halls this morning. Discussed pain management with patient yesterday evening and again this morning. Dr. Eather ColasHeag had been prescribed Norco 10/325 every 6 hours, but I told the patient that she does need to increase up to every 4 hours. 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  Plan: Have encouraged ambulation, and have asked the patient to ambulate in the halls at least 4-5 more times today, spread out through today and this evening. We'll add Naprosyn 5 mg by mouth twice a day. Continue to progress through postoperative recovery.  Hewitt ShortsNUDELMAN,ROBERT W, MD 01/02/2016, 8:13 AM

## 2016-03-19 ENCOUNTER — Other Ambulatory Visit: Payer: Self-pay | Admitting: Neurosurgery

## 2016-04-02 NOTE — Pre-Procedure Instructions (Signed)
Colin InaConnie Beumer  04/02/2016      [id number] - [place], [place] - [address] [place] [id number] Phone: [phone] Fax: [phone]    Your procedure is scheduled on Mon, Sept 18 @ 7:30 AM  [facility] at 5:30 AM  Call this number if you have problems the morning of surgery:  [phone]   Remember:  Do not eat food or drink liquids after midnight.  Take these medicines the morning of surgery with A SIP OF WATER Albuterol<Bring Your Inhaler With You>,Prozac(Fluoxetine),Flonase (Fluticasone),Gabapentin(Neurontin),Pain Pill(if needed)              Stop taking your Aspirin and Aleve. No Goody's,BC's,Advil,Motrin,Ibuprofen,Fish Oil,or any Herbal Medications.             Do not wear jewelry, make-up or nail polish.  Do not wear lotions, powders, or perfumes, or deoderant.  Do not shave 48 hours prior to surgery.    Do not bring valuables to the hospital.  [facility] is not responsible for any belongings or valuables.  Contacts, dentures or bridgework may not be worn into surgery.  Leave your suitcase in the car.  After surgery it may be brought to your room.  For patients admitted to the hospital, discharge time will be determined by your treatment team.  Patients discharged the day of surgery will not be allowed to drive home.    [facility]  Before surgery, you can play an important role.  Because skin is not sterile, your skin needs to be as free of germs as possible.  You can reduce the number of germs on you skin by washing with CHG (chlorahexidine gluconate) soap before surgery.  CHG is an antiseptic cleaner which kills germs and bonds with the skin to continue killing germs even after washing.  Please DO NOT use if you have an allergy to CHG or antibacterial soaps.  If your skin becomes reddened/irritated stop using the CHG and inform your nurse when you  arrive at Short Stay.  Do not shave (including legs and underarms) for at least 48 hours prior to the first CHG shower.  You may shave your face.  Please follow these instructions carefully:   1.  Shower with CHG Soap the night before surgery and the                                morning of Surgery.  2.  If you choose to wash your hair, wash your hair first as usual with your       normal shampoo.  3.  After you shampoo, rinse your hair and body thoroughly to remove the                      Shampoo.  4.  Use CHG as you would any other liquid soap.  You can apply chg directly       to the skin and wash gently with scrungie or a clean washcloth.  5.  Apply the CHG Soap to your body ONLY FROM THE NECK DOWN.        Do not use on open wounds or open sores.  Avoid contact with your eyes,       ears, mouth and genitals (private parts).  Wash genitals (private parts)       with  your normal soap.  6.  Wash thoroughly, paying special attention to the area where your surgery        will be performed.  7.  Thoroughly rinse your body with warm water from the neck down.  8.  DO NOT shower/wash with your normal soap after using and rinsing off       the CHG Soap.  9.  Pat yourself dry with a clean towel.            10.  Wear clean pajamas.            11.  Place clean sheets on your bed the night of your first shower and do not        sleep with pets.  Day of Surgery  Do not apply any lotions/deoderants the morning of surgery.  Please wear clean clothes to the hospital/surgery center.    Please read over the following fact sheets that you were given. Coughing and Deep Breathing

## 2016-04-03 ENCOUNTER — Encounter (HOSPITAL_COMMUNITY)
Admission: RE | Admit: 2016-04-03 | Discharge: 2016-04-03 | Disposition: A | Discharge status: Home / Self Care | Payer: Medicaid Other | Source: Ambulatory Visit | Attending: Neurosurgery | Admitting: Neurosurgery

## 2016-04-03 ENCOUNTER — Encounter (HOSPITAL_COMMUNITY): Payer: Self-pay | Admitting: Vascular Surgery

## 2016-04-03 ENCOUNTER — Encounter (HOSPITAL_COMMUNITY): Payer: Self-pay

## 2016-04-03 DIAGNOSIS — Z01812 Encounter for preprocedural laboratory examination: Secondary | ICD-10-CM | POA: Diagnosis present

## 2016-04-03 HISTORY — DX: Cervicalgia: M54.2

## 2016-04-03 HISTORY — DX: Personal history of other specified conditions: Z87.898

## 2016-04-03 LAB — CBC
HCT: 46.3 % — ABNORMAL HIGH (ref 36.0–46.0)
Hemoglobin: 15.5 g/dL — ABNORMAL HIGH (ref 12.0–15.0)
MCH: 32.6 pg (ref 26.0–34.0)
MCHC: 33.5 g/dL (ref 30.0–36.0)
MCV: 97.3 fL (ref 78.0–100.0)
Platelets: 278 10*3/uL (ref 150–400)
RBC: 4.76 MIL/uL (ref 3.87–5.11)
RDW: 13.7 % (ref 11.5–15.5)
WBC: 14.8 10*3/uL — AB (ref 4.0–10.5)

## 2016-04-03 LAB — BASIC METABOLIC PANEL
Anion gap: 11 (ref 5–15)
BUN: 8 mg/dL (ref 6–20)
CALCIUM: 9.6 mg/dL (ref 8.9–10.3)
CO2: 23 mmol/L (ref 22–32)
CREATININE: 0.69 mg/dL (ref 0.44–1.00)
Chloride: 104 mmol/L (ref 101–111)
Glucose, Bld: 160 mg/dL — ABNORMAL HIGH (ref 65–99)
Potassium: 3.5 mmol/L (ref 3.5–5.1)
SODIUM: 138 mmol/L (ref 135–145)

## 2016-04-03 NOTE — Progress Notes (Signed)
PCP: none Cardiologist: none  EKG: 07/31/15 Echo and stress test in 2008 with Dr. Antoine PocheHochrein per pt? Pt with hx palpitations but states she was worked up for this and hasn't had any problems. Spoke with [place], [place], no need to repeat EKG today.   No complaints of chest pain, SOB, signs of infection currently.

## 2016-04-04 NOTE — Progress Notes (Signed)
Anesthesia chart review: Patient is a 55 year old female scheduled for left carpal tunnel release on 04/08/2016 by Dr. Newell CoralNudelman.  History includes smoking, COPD, asthma, insomnia, fibromyalgia, anxiety, depression, bipolar disorder, exertional dyspnea, palpitations, C5-7 ACDF 08/03/15, right carpal tunnel release 11/29/15, L4-5 PLIF 01/01/16, cholecystectomy, hysterectomy. BMI is consistent with morbid obesity. PCP is listed as being with [facility].  Meds include albuterol, ASA 325 mg (on hold), Flexeril, Prozac, Flonase, Neurontin, Norco, Lovaza, Percocet, Ambien CR.  BP (!) 168/90 Comment: rechecked bp  Pulse (!) 112   Temp 36.9 C   Resp 20   Ht 5\' 4"  (1.626 m)   Wt 247 lb 14.4 oz (112.4 kg)   SpO2 97%   BMI 42.55 kg/m  BP 174/109 on arrival, 168/90 on recheck. Unfortunately, her HR was not rechecked. She is not on any antihypertensive medications.    07/31/15 EKG: NSR. She reported evaluation by cardiologist Dr. Antoine PocheHochrein for palpitations ~ 2008 (encounters list a date of 06/05/05, but records are not viewable), but denied any on-going issues with this.   Preoperative labs noted. WBC 14.8. H/H 15.5/46.3. Glucose 160. Cr 0.69.   Per PAT RN notes, patient denied CP, SOB or signs of infection at her PAT visit. I did not see patient at PAT and was not aware of abnormal vitals until chart reviewed today. I have left a voice message with Lowella BandyNikki at Dr. Earl GalaNudelman's office regarding BP, HR, and WBC results from PAT. Patient will get vitals on arrival. If she is consistently tachycardiac then could consider repeating her EKG (will defer to her anesthesiologist). However, it vitals are stable, Dr. Newell CoralNudelman feels labs acceptable, and no concerns for active infection then I would anticipate that she could proceed as planned.  Velna Ochsllison Jonica Bickhart, PA-C [facility]hesiology Phone [phone] 04/04/2016 3:22 PM

## 2016-04-08 ENCOUNTER — Ambulatory Visit (HOSPITAL_COMMUNITY): Admission: RE | Admit: 2016-04-08 | Payer: Medicaid Other | Source: Ambulatory Visit | Admitting: Neurosurgery

## 2016-04-08 ENCOUNTER — Encounter (HOSPITAL_COMMUNITY): Admission: RE | Payer: Self-pay | Source: Ambulatory Visit

## 2016-04-08 SURGERY — CARPAL TUNNEL RELEASE
Anesthesia: Monitor Anesthesia Care | Laterality: Left

## 2016-05-01 ENCOUNTER — Ambulatory Visit: Payer: Self-pay | Admitting: Family Medicine

## 2016-05-14 ENCOUNTER — Other Ambulatory Visit: Payer: Self-pay | Admitting: Neurosurgery

## 2016-05-20 NOTE — Progress Notes (Signed)
Subjective:    Patient ID: Dana Holland, female    DOB: Jul 13, 1961, 55 y.o.   MRN: [id number]  HPI 55 year old female. First visit here she has chronic back and neck pain and is followed by pain clinic in [place] who give her hydrocodone and oxycodone. She also sees psychiatrist for bipolar disease current medicines there are Neurontin as a mood stabilizer and Prozac his antidepressant. Symptoms are fairly well managed per history. There is a history of hypertension and hyperlipidemia. She is on no medication for this. There is no history of diabetes but given presence of these other 2 issues I will be surprised if her sugar is normal. She plans to continue on with pain clinic and psychiatric service.  Patient Active Problem List   Diagnosis Date Noted  . Lumbar stenosis with neurogenic claudication 01/01/2016  . HNP (herniated nucleus pulposus), cervical 08/03/2015   Outpatient Encounter Prescriptions as of 05/21/2016  Medication Sig  . albuterol (PROVENTIL HFA;VENTOLIN HFA) 108 (90 Base) MCG/ACT inhaler Inhale 2 puffs into the lungs every 6 (six) hours as needed for wheezing or shortness of breath.   . cyclobenzaprine (FLEXERIL) 10 MG tablet Take 0.5-1 tablets (5-10 mg total) by mouth 3 (three) times daily as needed for muscle spasms.  . diphenhydrAMINE (BENADRYL) 25 MG tablet Take 25 mg by mouth every 6 (six) hours as needed for allergies.  Marland Kitchen FLUoxetine (PROZAC) 40 MG capsule Take 40 mg by mouth daily.  . fluticasone (FLONASE) 50 MCG/ACT nasal spray Place 1 spray into both nostrils daily as needed for allergies.   Marland Kitchen gabapentin (NEURONTIN) 600 MG tablet Take 600-1,200 mg by mouth 3 (three) times daily. 600 mg twice a day and 1200 mg at bedtime  . HYDROcodone-acetaminophen (NORCO) 10-325 MG tablet Take 1 tablet by mouth every 4 (four) hours as needed for moderate pain.   . naproxen sodium (ANAPROX) 220 MG tablet Take 440 mg by mouth daily as needed (for pain).  Marland Kitchen zolpidem (AMBIEN CR)  6.25 MG CR tablet Take 6.25 mg by mouth at bedtime as needed for sleep.  Marland Kitchen aspirin 325 MG EC tablet Take 325 mg by mouth daily as needed for pain.  Marland Kitchen omega-3 acid ethyl esters (LOVAZA) 1 g capsule Take 1 g by mouth daily.   . [DISCONTINUED] oxyCODONE-acetaminophen (PERCOCET/ROXICET) 5-325 MG tablet Take 1-2 tablets by mouth 4 (four) times daily as needed (pain). (Patient not taking: Reported on 05/20/2016)   No facility-administered encounter medications on file as of 05/21/2016.       Review of Systems  Constitutional: Negative.   Respiratory: Positive for shortness of breath.   Cardiovascular: Negative.   Genitourinary: Negative.   Neurological: Negative.   Psychiatric/Behavioral: Negative.        Objective:   Physical Exam  Constitutional: She is oriented to person, place, and time. She appears well-developed and well-nourished.  Patient is obese. BMI 43.6  HENT:  Mouth/Throat: Oropharynx is clear and moist.  Cardiovascular: Normal rate, regular rhythm and normal heart sounds.   Pulmonary/Chest: Effort normal and breath sounds normal.  Abdominal: There is no tenderness.  Musculoskeletal:  Patient seems to be limited by pains in her back  Neurological: She is alert and oriented to person, place, and time.  Psychiatric: She has a normal mood and affect.   BP (!) 176/105   Pulse 93   Temp 98.1 F (36.7 C) (Oral)   Ht 5' 4"  (1.626 m)   Wt 254 lb (115.2 kg)   BMI 43.60 kg/m  Assessment & Plan:  1. Elevated lipids History of hyperlipidemia. We need to is really assess this and probably initiate treatment depending on results - CMP14+EGFR - Lipid panel  2. Bipolar disease, chronic ([place]) Continue with Neurontin and Prozac  3. Elevated blood-pressure reading without diagnosis of hypertension Would like to see her renal function before we begin treatment  Wardell Honour MD

## 2016-05-21 ENCOUNTER — Encounter (INDEPENDENT_AMBULATORY_CARE_PROVIDER_SITE_OTHER): Payer: Self-pay

## 2016-05-21 ENCOUNTER — Ambulatory Visit (INDEPENDENT_AMBULATORY_CARE_PROVIDER_SITE_OTHER): Payer: Medicaid Other | Admitting: Family Medicine

## 2016-05-21 ENCOUNTER — Encounter: Payer: Self-pay | Admitting: Family Medicine

## 2016-05-21 VITALS — BP 176/105 | HR 93 | Temp 98.1°F | Ht 64.0 in | Wt 254.0 lb

## 2016-05-21 DIAGNOSIS — F319 Bipolar disorder, unspecified: Secondary | ICD-10-CM

## 2016-05-21 DIAGNOSIS — E784 Other hyperlipidemia: Secondary | ICD-10-CM

## 2016-05-21 DIAGNOSIS — E785 Hyperlipidemia, unspecified: Secondary | ICD-10-CM

## 2016-05-21 DIAGNOSIS — R030 Elevated blood-pressure reading, without diagnosis of hypertension: Secondary | ICD-10-CM | POA: Diagnosis not present

## 2016-05-21 MED ORDER — ALBUTEROL SULFATE HFA 108 (90 BASE) MCG/ACT IN AERS: 2.0000 | INHALATION_SPRAY | Freq: Four times a day (QID) | RESPIRATORY_TRACT | 2 refills | Status: DC | PRN

## 2016-05-21 MED ORDER — FLUTICASONE PROPIONATE 50 MCG/ACT NA SUSP
1.0000 | Freq: Every day | NASAL | 5 refills | Status: DC | PRN
Start: 2016-05-21 — End: 2016-12-02

## 2016-05-22 ENCOUNTER — Encounter (HOSPITAL_COMMUNITY): Payer: Self-pay | Admitting: *Deleted

## 2016-05-22 LAB — CMP14+EGFR
A/G RATIO: 1 — AB (ref 1.2–2.2)
ALT: 66 IU/L — AB (ref 0–32)
AST: 62 IU/L — AB (ref 0–40)
Albumin: 3.7 g/dL (ref 3.5–5.5)
Alkaline Phosphatase: 156 IU/L — ABNORMAL HIGH (ref 39–117)
BILIRUBIN TOTAL: 0.4 mg/dL (ref 0.0–1.2)
BUN/Creatinine Ratio: 13 (ref 9–23)
BUN: 10 mg/dL (ref 6–24)
CHLORIDE: 98 mmol/L (ref 96–106)
CO2: 26 mmol/L (ref 18–29)
Calcium: 9.3 mg/dL (ref 8.7–10.2)
Creatinine, Ser: 0.79 mg/dL (ref 0.57–1.00)
GFR calc Af Amer: 97 mL/min/{1.73_m2} (ref >59)
GFR calc non Af Amer: 85 mL/min/{1.73_m2} (ref >59)
GLOBULIN, TOTAL: 3.8 g/dL (ref 1.5–4.5)
Glucose: 136 mg/dL — ABNORMAL HIGH (ref 65–99)
POTASSIUM: 4.2 mmol/L (ref 3.5–5.2)
SODIUM: 140 mmol/L (ref 134–144)
Total Protein: 7.5 g/dL (ref 6.0–8.5)

## 2016-05-22 LAB — LIPID PANEL
Chol/HDL Ratio: 5 ratio units — ABNORMAL HIGH (ref 0.0–4.4)
Cholesterol, Total: 204 mg/dL — ABNORMAL HIGH (ref 100–199)
HDL: 41 mg/dL (ref >39)
LDL Calculated: 124 mg/dL — ABNORMAL HIGH (ref 0–99)
TRIGLYCERIDES: 193 mg/dL — AB (ref 0–149)
VLDL Cholesterol Cal: 39 mg/dL (ref 5–40)

## 2016-05-22 MED ORDER — LISINOPRIL 10 MG PO TABS: 10.0000 mg | ORAL_TABLET | Freq: Every day | ORAL | 0 refills | Status: DC

## 2016-05-22 MED ORDER — VANCOMYCIN HCL 10 G IV SOLR
1500.0000 mg | INTRAVENOUS | Status: AC
  Administered 2016-05-23: 1500 mg via INTRAVENOUS
  Filled 2016-05-22: qty 1500

## 2016-05-22 NOTE — Anesthesia Preprocedure Evaluation (Addendum)
Anesthesia Evaluation  Patient identified by MRN, date of birth, ID band Patient awake    Reviewed: Allergy & Precautions, NPO status , Patient's Chart, lab work & pertinent test results  Airway Mallampati: II  TM Distance: >3 FB Neck ROM: Full    Dental  (+) Edentulous Upper, Edentulous Lower   Pulmonary shortness of breath and with exertion, asthma , COPD,  COPD inhaler, Current Smoker,    Pulmonary exam normal breath sounds clear to auscultation       Cardiovascular hypertension, Normal cardiovascular exam Rhythm:Regular Rate:Normal  Recently started on medications but has not taken   Neuro/Psych  Headaches, PSYCHIATRIC DISORDERS Anxiety Depression Bipolar Disorder Left carpal tunnel syndrome    GI/Hepatic Neg liver ROS, GERD  ,  Endo/Other  Morbid obesityHyperlipidemia  Renal/GU Hx/o renal calculi  negative genitourinary   Musculoskeletal  (+) Arthritis , Osteoarthritis,  Fibromyalgia -Chronic back pain   Abdominal (+) + obese,   Peds  Hematology   Anesthesia Other Findings   Reproductive/Obstetrics                            This SmartLink has not been configured with any valid records.    Lab Results  Component Value Date   WBC 14.8 (H) 04/03/2016   HGB 15.5 (H) 04/03/2016   HCT 46.3 (H) 04/03/2016   MCV 97.3 04/03/2016   PLT 278 04/03/2016     Chemistry      Component Value Date/Time   NA 140 05/21/2016 1118   K 4.2 05/21/2016 1118   CL 98 05/21/2016 1118   CO2 26 05/21/2016 1118   BUN 10 05/21/2016 1118   CREATININE 0.79 05/21/2016 1118      Component Value Date/Time   CALCIUM 9.3 05/21/2016 1118   ALKPHOS 156 (H) 05/21/2016 1118   AST 62 (H) 05/21/2016 1118   ALT 66 (H) 05/21/2016 1118   BILITOT 0.4 05/21/2016 1118     EKG: normal EKG, normal sinus rhythm.  Anesthesia Physical Anesthesia Plan  ASA: III  Anesthesia Plan: Bier Block   Post-op Pain Management:     Induction: Intravenous  Airway Management Planned: Natural Airway and Nasal Cannula  Additional Equipment:   Intra-op Plan:   Post-operative Plan:   Informed Consent: I have reviewed the patients History and Physical, chart, labs and discussed the procedure including the risks, benefits and alternatives for the proposed anesthesia with the patient or authorized representative who has indicated his/her understanding and acceptance.     Plan Discussed with: Anesthesiologist, CRNA and Surgeon  Anesthesia Plan Comments:         Anesthesia Quick Evaluation

## 2016-05-22 NOTE — Addendum Note (Signed)
Addended by: Almeta MonasSTONE, Geneen Dieter M on: 05/22/2016 10:33 AM   Modules accepted: Orders

## 2016-05-22 NOTE — Progress Notes (Signed)
Spoke with pt for pre-op call. Pt denies cardiac history, chest pain or sob. Pt states she was just diagnosed with HTN, has not started Lisinopril yet.

## 2016-05-23 ENCOUNTER — Ambulatory Visit (HOSPITAL_COMMUNITY): Payer: Medicaid Other | Admitting: Emergency Medicine

## 2016-05-23 ENCOUNTER — Encounter (HOSPITAL_COMMUNITY)
Admission: RE | Disposition: A | Discharge status: Home / Self Care | Payer: Self-pay | Source: Ambulatory Visit | Attending: Neurosurgery

## 2016-05-23 ENCOUNTER — Encounter (HOSPITAL_COMMUNITY): Payer: Self-pay | Admitting: *Deleted

## 2016-05-23 ENCOUNTER — Ambulatory Visit (HOSPITAL_COMMUNITY)
Admission: RE | Admit: 2016-05-23 | Discharge: 2016-05-23 | Disposition: A | Discharge status: Home / Self Care | Payer: Medicaid Other | Source: Ambulatory Visit | Attending: Neurosurgery | Admitting: Neurosurgery

## 2016-05-23 DIAGNOSIS — M797 Fibromyalgia: Secondary | ICD-10-CM | POA: Diagnosis not present

## 2016-05-23 DIAGNOSIS — F419 Anxiety disorder, unspecified: Secondary | ICD-10-CM | POA: Diagnosis not present

## 2016-05-23 DIAGNOSIS — Z6841 Body Mass Index (BMI) 40.0 and over, adult: Secondary | ICD-10-CM | POA: Diagnosis not present

## 2016-05-23 DIAGNOSIS — F17210 Nicotine dependence, cigarettes, uncomplicated: Secondary | ICD-10-CM | POA: Insufficient documentation

## 2016-05-23 DIAGNOSIS — Z7982 Long term (current) use of aspirin: Secondary | ICD-10-CM | POA: Insufficient documentation

## 2016-05-23 DIAGNOSIS — M48062 Spinal stenosis, lumbar region with neurogenic claudication: Secondary | ICD-10-CM | POA: Diagnosis not present

## 2016-05-23 DIAGNOSIS — G8929 Other chronic pain: Secondary | ICD-10-CM | POA: Insufficient documentation

## 2016-05-23 DIAGNOSIS — G5602 Carpal tunnel syndrome, left upper limb: Secondary | ICD-10-CM

## 2016-05-23 DIAGNOSIS — R030 Elevated blood-pressure reading, without diagnosis of hypertension: Secondary | ICD-10-CM | POA: Diagnosis not present

## 2016-05-23 DIAGNOSIS — Z79899 Other long term (current) drug therapy: Secondary | ICD-10-CM | POA: Diagnosis not present

## 2016-05-23 DIAGNOSIS — M1990 Unspecified osteoarthritis, unspecified site: Secondary | ICD-10-CM | POA: Diagnosis not present

## 2016-05-23 DIAGNOSIS — J449 Chronic obstructive pulmonary disease, unspecified: Secondary | ICD-10-CM | POA: Diagnosis not present

## 2016-05-23 DIAGNOSIS — K219 Gastro-esophageal reflux disease without esophagitis: Secondary | ICD-10-CM | POA: Insufficient documentation

## 2016-05-23 DIAGNOSIS — E669 Obesity, unspecified: Secondary | ICD-10-CM | POA: Diagnosis not present

## 2016-05-23 DIAGNOSIS — M5136 Other intervertebral disc degeneration, lumbar region: Secondary | ICD-10-CM | POA: Diagnosis not present

## 2016-05-23 DIAGNOSIS — F319 Bipolar disorder, unspecified: Secondary | ICD-10-CM | POA: Diagnosis not present

## 2016-05-23 HISTORY — DX: Personal history of urinary calculi: Z87.442

## 2016-05-23 HISTORY — PX: CARPAL TUNNEL RELEASE: SHX101

## 2016-05-23 LAB — CBC
HCT: 47.6 % — ABNORMAL HIGH (ref 36.0–46.0)
Hemoglobin: 15.9 g/dL — ABNORMAL HIGH (ref 12.0–15.0)
MCH: 32.5 pg (ref 26.0–34.0)
MCHC: 33.4 g/dL (ref 30.0–36.0)
MCV: 97.3 fL (ref 78.0–100.0)
Platelets: 233 10*3/uL (ref 150–400)
RBC: 4.89 MIL/uL (ref 3.87–5.11)
RDW: 14.5 % (ref 11.5–15.5)
WBC: 14 10*3/uL — ABNORMAL HIGH (ref 4.0–10.5)

## 2016-05-23 SURGERY — CARPAL TUNNEL RELEASE
Anesthesia: Regional | Site: Wrist | Laterality: Left

## 2016-05-23 MED ORDER — DEXMEDETOMIDINE HCL 200 MCG/2ML IV SOLN
INTRAVENOUS | Status: DC | PRN
  Administered 2016-05-23 (×3): 4 ug via INTRAVENOUS

## 2016-05-23 MED ORDER — FENTANYL CITRATE (PF) 100 MCG/2ML IJ SOLN
INTRAMUSCULAR | Status: AC
  Filled 2016-05-23: qty 2

## 2016-05-23 MED ORDER — CHLORHEXIDINE GLUCONATE CLOTH 2 % EX PADS: 6.0000 | MEDICATED_PAD | Freq: Once | CUTANEOUS | Status: DC

## 2016-05-23 MED ORDER — BUPIVACAINE HCL (PF) 0.5 % IJ SOLN
INTRAMUSCULAR | Status: AC
  Filled 2016-05-23: qty 30

## 2016-05-23 MED ORDER — LIDOCAINE-EPINEPHRINE 1 %-1:100000 IJ SOLN
INTRAMUSCULAR | Status: AC
  Filled 2016-05-23: qty 1

## 2016-05-23 MED ORDER — MIDAZOLAM HCL 2 MG/2ML IJ SOLN
INTRAMUSCULAR | Status: AC
  Filled 2016-05-23: qty 2

## 2016-05-23 MED ORDER — PROPOFOL 500 MG/50ML IV EMUL
INTRAVENOUS | Status: DC | PRN
  Administered 2016-05-23: 75 ug/kg/min via INTRAVENOUS

## 2016-05-23 MED ORDER — LIDOCAINE-EPINEPHRINE 1 %-1:100000 IJ SOLN
INTRAMUSCULAR | Status: DC | PRN
  Administered 2016-05-23: 4 mL via INTRADERMAL

## 2016-05-23 MED ORDER — THROMBIN 5000 UNITS EX SOLR
CUTANEOUS | Status: AC
  Filled 2016-05-23: qty 5000

## 2016-05-23 MED ORDER — DIPHENHYDRAMINE HCL 50 MG/ML IJ SOLN
INTRAMUSCULAR | Status: DC | PRN
  Administered 2016-05-23: 25 mg via INTRAVENOUS

## 2016-05-23 MED ORDER — DEXMEDETOMIDINE HCL IN NACL 200 MCG/50ML IV SOLN
INTRAVENOUS | Status: AC
  Filled 2016-05-23: qty 50

## 2016-05-23 MED ORDER — BUPIVACAINE HCL (PF) 0.5 % IJ SOLN
INTRAMUSCULAR | Status: DC | PRN
  Administered 2016-05-23: 4 mL

## 2016-05-23 MED ORDER — LACTATED RINGERS IV SOLN
INTRAVENOUS | Status: DC | PRN
  Administered 2016-05-23: 07:00:00 via INTRAVENOUS

## 2016-05-23 MED ORDER — GENTAMICIN IN SALINE 1.6-0.9 MG/ML-% IV SOLN
80.0000 mg | Freq: Once | INTRAVENOUS | Status: AC
  Administered 2016-05-23: 80 mg via INTRAVENOUS
  Filled 2016-05-23: qty 50

## 2016-05-23 MED ORDER — FENTANYL CITRATE (PF) 100 MCG/2ML IJ SOLN
INTRAMUSCULAR | Status: DC | PRN
  Administered 2016-05-23 (×2): 100 ug via INTRAVENOUS

## 2016-05-23 MED ORDER — MIDAZOLAM HCL 5 MG/5ML IJ SOLN
INTRAMUSCULAR | Status: DC | PRN
  Administered 2016-05-23: 2 mg via INTRAVENOUS

## 2016-05-23 MED ORDER — LIDOCAINE HCL (CARDIAC) 20 MG/ML IV SOLN
INTRAVENOUS | Status: DC | PRN
  Administered 2016-05-23: 40 mg via INTRATRACHEAL

## 2016-05-23 MED ORDER — BACITRACIN 50000 UNITS IM SOLR
INTRAMUSCULAR | Status: DC | PRN
  Administered 2016-05-23: 08:00:00

## 2016-05-23 SURGICAL SUPPLY — 32 items
BANDAGE GAUZE 4  KLING STR (GAUZE/BANDAGES/DRESSINGS) ×2 IMPLANT
BLADE SURG 15 STRL LF DISP TIS (BLADE) ×2 IMPLANT
BLADE SURG 15 STRL SS (BLADE) ×4
CORDS BIPOLAR (ELECTRODE) ×2 IMPLANT
DRAPE EXTREMITY T 121X128X90 (DRAPE) ×3 IMPLANT
DRAPE HALF SHEET 40X57 (DRAPES) ×2 IMPLANT
DRSG EMULSION OIL 3X3 NADH (GAUZE/BANDAGES/DRESSINGS) ×1 IMPLANT
GAUZE SPONGE 4X4 12PLY STRL (GAUZE/BANDAGES/DRESSINGS) ×2 IMPLANT
GAUZE SPONGE 4X4 16PLY XRAY LF (GAUZE/BANDAGES/DRESSINGS) ×5 IMPLANT
GLOVE BIOGEL PI IND STRL 8 (GLOVE) ×1 IMPLANT
GLOVE BIOGEL PI INDICATOR 8 (GLOVE) ×1
GLOVE ECLIPSE 7.5 STRL STRAW (GLOVE) ×2 IMPLANT
GLOVE EXAM NITRILE LRG STRL (GLOVE) IMPLANT
GLOVE EXAM NITRILE XL STR (GLOVE) IMPLANT
GLOVE EXAM NITRILE XS STR PU (GLOVE) IMPLANT
GLOVE INDICATOR 7.0 STRL GRN (GLOVE) ×2 IMPLANT
GOWN STRL REUS W/ TWL XL LVL3 (GOWN DISPOSABLE) ×2 IMPLANT
GOWN STRL REUS W/TWL 2XL LVL3 (GOWN DISPOSABLE) IMPLANT
GOWN STRL REUS W/TWL XL LVL3 (GOWN DISPOSABLE) ×4
KIT BASIN OR (CUSTOM PROCEDURE TRAY) ×2 IMPLANT
KIT ROOM TURNOVER OR (KITS) ×2 IMPLANT
NEEDLE HYPO 22GX1.5 SAFETY (NEEDLE) ×2 IMPLANT
PACK SURGICAL SETUP 50X90 (CUSTOM PROCEDURE TRAY) ×2 IMPLANT
PAD ARMBOARD 7.5X6 YLW CONV (MISCELLANEOUS) ×6 IMPLANT
STOCKINETTE 4X48 STRL (DRAPES) ×2 IMPLANT
SUT ETHILON 4 0 PS 2 18 (SUTURE) ×12 IMPLANT
SUT VIC AB 3-0 SH 8-18 (SUTURE) ×2 IMPLANT
SUT VICRYL 4-0 PS2 18IN ABS (SUTURE) IMPLANT
SYR BULB 3OZ (MISCELLANEOUS) ×1 IMPLANT
SYR CONTROL 10ML LL (SYRINGE) ×1 IMPLANT
TOWEL OR 17X24 6PK STRL BLUE (TOWEL DISPOSABLE) ×6 IMPLANT
UNDERPAD 30X30 (UNDERPADS AND DIAPERS) ×2 IMPLANT

## 2016-05-23 NOTE — Anesthesia Procedure Notes (Signed)
Procedure Name: MAC Date/Time: 05/23/2016 7:39 AM Performed by: Marena ChancyBECKNER, Cephus Tupy S Pre-anesthesia Checklist: Patient identified, Emergency Drugs available and Suction available Patient Re-evaluated:Patient Re-evaluated prior to inductionOxygen Delivery Method: Nasal cannula

## 2016-05-23 NOTE — H&P (Signed)
Subjective: Patient is a 55 y.o. female who is admitted for treatment of carpal tunnel syndrome.  She is status post a right carpal tunnel release nearly 6 months ago, and returns now for left carpal tunnel release.    Patient Active Problem List   Diagnosis Date Noted  . Bipolar disease, chronic (HCC) 05/21/2016  . Elevated blood-pressure reading without diagnosis of hypertension 05/21/2016  . Lumbar stenosis with neurogenic claudication 01/01/2016  . HNP (herniated nucleus pulposus), cervical 08/03/2015   Past Medical History:  Diagnosis Date  . Anxiety   . Asthma   . Bipolar disorder (HCC)    takes Lamictal daily  . Chronic back pain   . Chronic back pain   . COPD (chronic obstructive pulmonary disease) (HCC)   . DDD (degenerative disc disease)   . DDD (degenerative disc disease), lumbar   . DDD (degenerative disc disease), lumbar   . Depression    takes Prozac daily  . DJD (degenerative joint disease)   . Fibromyalgia   . Headache    migraines  . History of kidney stones   . History of palpitations    "I was checked out by Dr. Antoine PocheHochrein and everything okay"  . Hypertension   . Insomnia    takes Ambien nightly  . Lumbar stenosis with neurogenic claudication   . Neck pain   . Seasonal allergies   . Shortness of breath dyspnea    with exertion    Past Surgical History:  Procedure Laterality Date  . ABDOMINAL EXPLORATION SURGERY    . ABDOMINAL HYSTERECTOMY    . ANTERIOR CERVICAL DECOMP/DISCECTOMY FUSION N/A 08/03/2015   Procedure: ACDF C5-C6  C6-C7;  Surgeon: Shirlean Kellyobert Anjelita Sheahan, MD;  Location: [facility];  Service: Neurosurgery;  Laterality: N/A;  ACDF C5-C6  C6-C7  . BACK SURGERY     lower back  . BREAST SURGERY    . CARPAL TUNNEL RELEASE Right 11/29/2015   Procedure: CARPAL TUNNEL RELEASE-Right;  Surgeon: Shirlean Kellyobert Madelynn Malson, MD;  Location: [facility];  Service: Neurosurgery;  Laterality: Right;  CARPAL TUNNEL RELEASE-Right  . CHOLECYSTECTOMY    . DILATION AND  CURETTAGE OF UTERUS    . KNEE SURGERY    . MULTIPLE TOOTH EXTRACTIONS    . TUBAL LIGATION      Prescriptions Prior to Admission  Medication Sig Dispense Refill Last Dose  . albuterol (PROVENTIL HFA;VENTOLIN HFA) 108 (90 Base) MCG/ACT inhaler Inhale 2 puffs into the lungs every 6 (six) hours as needed for wheezing or shortness of breath. 6.7 g 2 05/23/2016 at 0500  . aspirin 325 MG EC tablet Take 325 mg by mouth daily as needed for pain.   Past Month at Unknown time  . cyclobenzaprine (FLEXERIL) 10 MG tablet Take 0.5-1 tablets (5-10 mg total) by mouth 3 (three) times daily as needed for muscle spasms. 50 tablet 0 05/22/2016 at Unknown time  . diphenhydrAMINE (BENADRYL) 25 MG tablet Take 25 mg by mouth every 6 (six) hours as needed for allergies.   05/22/2016 at Unknown time  . FLUoxetine (PROZAC) 40 MG capsule Take 40 mg by mouth daily.   05/22/2016 at Unknown time  . fluticasone (FLONASE) 50 MCG/ACT nasal spray Place 1 spray into both nostrils daily as needed for allergies. 15.8 g 5 Past Week at Unknown time  . gabapentin (NEURONTIN) 600 MG tablet Take 600-1,200 mg by mouth 3 (three) times daily. 600 mg twice a day and 1200 mg at bedtime  0 05/22/2016 at Unknown time  .  HYDROcodone-acetaminophen (NORCO) 10-325 MG tablet Take 1 tablet by mouth every 4 (four) hours as needed for moderate pain.    05/23/2016 at 0700  . naproxen sodium (ANAPROX) 220 MG tablet Take 440 mg by mouth daily as needed (for pain).   Past Month at Unknown time  . omega-3 acid ethyl esters (LOVAZA) 1 g capsule Take 1 g by mouth daily.    Past Month at Unknown time  . zolpidem (AMBIEN CR) 6.25 MG CR tablet Take 6.25 mg by mouth at bedtime as needed for sleep.   Past Week at Unknown time  . lisinopril (PRINIVIL,ZESTRIL) 10 MG tablet Take 1 tablet (10 mg total) by mouth daily. 90 tablet 0 not started   Allergies  Allergen Reactions  . Imitrex [Sumatriptan] Other (See Comments)    Blood vessels in neck felt like they were going to  burst, potential stroke  . Remeron [Mirtazapine] Other (See Comments)    Chest pain  . Wellbutrin [Bupropion] Hives, Shortness Of Breath, Swelling and Rash    SWELLING REACTION UNSPECIFIED   . Ibuprofen Hives, Swelling and Rash    SWELLING REACTION UNSPECIFIED   . Keflex [Cephalexin] Hives and Swelling  . Nsaids Hives, Swelling and Rash  . Paroxetine Hcl Other (See Comments)    "FELT LIKE BUGS BITING ALL OVER"  . Toradol [Ketorolac Tromethamine] Hives, Swelling and Rash  . Vioxx [Rofecoxib] Hives, Swelling and Rash    SWELLING REACTION UNSPECIFIED   . Bextra [Valdecoxib]     UNSPECIFIED REACTION   . Buspar [Buspirone]     UNSPECIFIED REACTION   . Compazine [Prochlorperazine Edisylate] Other (See Comments)    "Felt like pt was trapped, hallucinations"  . Cymbalta [Duloxetine Hcl] Other (See Comments)    UNSPECIFIED REACTION   . Effexor [Venlafaxine] Other (See Comments)    UNSPECIFIED REACTION   . Sulfa Antibiotics Hives and Swelling  . Voltaren [Diclofenac Sodium] Other (See Comments)    Severe intestinal bleeding, stomach in knots  . Tramadol Other (See Comments)    Severe headache/migraine    Social History  Substance Use Topics  . Smoking status: Current Every Day Smoker    Packs/day: 0.25    Years: 40.00    Types: Cigarettes, E-cigarettes    Last attempt to quit: 04/30/2015  . Smokeless tobacco: Never Used     Comment: currently uses Vapor 12/25/15, 5 cigarettes per day currently 04/03/16  . Alcohol use No    Family History  Problem Relation Age of Onset  . Heart disease Mother   . Arthritis Mother   . Hyperlipidemia Mother   . Hypertension Father   . Heart disease Father   . Diabetes Father   . Hyperlipidemia Father   . Hypertension Other   . Heart disease Other   . Arthritis Sister   . COPD Sister   . Depression Sister   . Hyperlipidemia Sister   . Hyperlipidemia Brother   . Hypertension Brother   . Asthma Son   . Arthritis Maternal Grandmother   .  Diabetes Maternal Grandmother   . Heart disease Maternal Grandmother   . Heart disease Maternal Grandfather   . Arthritis Paternal Grandmother   . Heart disease Paternal Grandmother   . Heart disease Paternal Grandfather      Review of Systems A comprehensive review of systems was negative.  Objective: Vital signs in last 24 hours: Temp:  [98.4 F (36.9 C)] 98.4 F (36.9 C) (11/02 0655) Pulse Rate:  [102] 102 (11/02 0655)  Resp:  [20] 20 (11/02 0655) BP: (161)/(82) 161/82 (11/02 0655) SpO2:  [97 %] 97 % (11/02 0655) Weight:  [115.2 kg (254 lb)] 115.2 kg (254 lb) (11/02 0655)  EXAM: Patient well-developed well-nourished white female in no acute distress. Lungs are clear to auscultation , the patient has symmetrical respiratory excursion. Heart has a regular rate and rhythm normal S1 and S2 no murmur.   Abdomen is soft nontender nondistended bowel sounds are present. Extremity examination shows no clubbing cyanosis or edema. Motor examination shows 5 over 5 strength in the upper extremities including the deltoid biceps triceps and intrinsics and grip. Sensation is intact to pinprick throughout the digits of the upper extremities. Reflexes are symmetrical and without evidence of pathologic reflexes. Patient has a normal gait and stance.   Data Review:CBC    Component Value Date/Time   WBC 14.8 (H) 04/03/2016 1101   RBC 4.76 04/03/2016 1101   HGB 15.5 (H) 04/03/2016 1101   HCT 46.3 (H) 04/03/2016 1101   PLT 278 04/03/2016 1101   MCV 97.3 04/03/2016 1101   MCH 32.6 04/03/2016 1101   MCHC 33.5 04/03/2016 1101   RDW 13.7 04/03/2016 1101   LYMPHSABS 3.7 11/11/2009 2142   MONOABS 1.1 (H) 11/11/2009 2142   EOSABS 0.3 11/11/2009 2142   BASOSABS 0.3 (H) 11/11/2009 2142                          BMET    Component Value Date/Time   NA 140 05/21/2016 1118   K 4.2 05/21/2016 1118   CL 98 05/21/2016 1118   CO2 26 05/21/2016 1118   GLUCOSE 136 (H) 05/21/2016 1118   GLUCOSE 160 (H)  04/03/2016 1101   BUN 10 05/21/2016 1118   CREATININE 0.79 05/21/2016 1118   CALCIUM 9.3 05/21/2016 1118   GFRNONAA 85 05/21/2016 1118   GFRAA 97 05/21/2016 1118     Assessment/Plan: Patient with carpal tunnel syndrome, status post right carpal tunnel release and presents now for a left carpal tunnel release.  We've discussed the nature the surgical procedure and its risks including risks of infection, bleeding, neurologic damage, and anesthetic complications. Understanding that she wants to go ahead with surgery.   Hewitt Shorts, MD 05/23/2016 7:10 AM

## 2016-05-23 NOTE — Op Note (Signed)
05/23/2016  8:51 AM  PATIENT:  Dana Holland  55 y.o. female  PRE-OPERATIVE DIAGNOSIS:  Carpal tunnel syndrome  POST-OPERATIVE DIAGNOSIS:  Carpal tunnel syndrome  PROCEDURE:  Procedure(s):  Left CARPAL TUNNEL RELEASE with Bier block  SURGEON:  Surgeon(s): Shirlean Kellyobert Nudelman, MD  ANESTHESIA:   regional  EBL:  50 cc   BLOOD ADMINISTERED:none  COUNT: Correct per nursing staff  DICTATION: Patient was brought to the operating room, a Bier block was administered by the anesthesia service to the left upper extremity. The left upper extremity was prepped with Betadine soap and solution and draped in a sterile fashion. An incision was made in the left palm extending from just distal to the distal carpal crease towards the mid palm. It is located just medial to the thenar crease. Dissection was carried down through the subcutaneous tissue to the transverse carpal ligament. The ligament was opened sharply from a distal to proximal direction taking care to leave the underlying structures undisturbed. It was opened in its full distal to proximal extent. We found significant thickening of the transverse carpal ligament and the significant compression of the median nerve was decompressed. Good decompression of the carpal tunnel and its contents was achieved. We had significant venous bleeding superficial tissue. We infiltrated local anesthetic with epinephrine into the subcutaneous tissues, and then released the tourniquet and the venous bleeding stopped. We then proceeded with closure. Subcutaneous layer was closed with interrupted inverted 3-0 undyed Vicryl suture. Skin is approximate interrupted horizontal mattress sutures using 4-0 nylon. The wound was dressed with gauze fluffs and wrapped with a Kling. The procedure was tolerated well, and following the procedure the patient wishes to the recovery room for further care.  PLAN OF CARE: Discharge to home after PACU  PATIENT DISPOSITION:  PACU -  hemodynamically stable.   Delay start of Pharmacological VTE agent (>24hrs) due to surgical blood loss or risk of bleeding:  yes

## 2016-05-23 NOTE — Anesthesia Postprocedure Evaluation (Signed)
Anesthesia Post Note  Patient: Dana InaConnie Holland  Procedure(s) Performed: Procedure(s) (LRB): Left CARPAL TUNNEL RELEASE with Bier block (Left)  Patient location during evaluation: PACU Anesthesia Type: Bier Block Level of consciousness: awake and alert and oriented Pain management: pain level controlled Vital Signs Assessment: post-procedure vital signs reviewed and stable Respiratory status: nonlabored ventilation, spontaneous breathing and respiratory function stable Cardiovascular status: blood pressure returned to baseline and stable Postop Assessment: no signs of nausea or vomiting Anesthetic complications: no    Last Vitals:  Vitals:   05/23/16 0900 05/23/16 0917  BP: 119/73 139/72  Pulse: 99 92  Resp: 18 16  Temp:      Last Pain:  Vitals:   05/23/16 0917  TempSrc:   PainSc: 0-No pain                 [name] A.

## 2016-05-23 NOTE — Transfer of Care (Signed)
Immediate Anesthesia Transfer of Care Note  Patient: Dana Holland  Procedure(s) Performed: Procedure(s) with comments: Left CARPAL TUNNEL RELEASE with Bier block (Left) - Left CARPAL TUNNEL RELEASE with Bier block  Patient Location: PACU  Anesthesia Type:MAC and Regional  Level of Consciousness: awake, alert  and oriented  Airway & Oxygen Therapy: Patient Spontanous Breathing and Patient connected to nasal cannula oxygen  Post-op Assessment: Report given to RN, Post -op Vital signs reviewed and stable and Patient moving all extremities X 4  Post vital signs: Reviewed and stable  Last Vitals:  Vitals:   05/23/16 0655 05/23/16 0858  BP: (!) 161/82   Pulse: (!) 102 98  Resp: 20   Temp: 36.9 C 36.7 C    Last Pain:  Vitals:   05/23/16 0655  TempSrc: Oral  PainSc:       Patients Stated Pain Goal: 4 (05/23/16 0644)  Complications: No apparent anesthesia complications

## 2016-05-23 NOTE — Progress Notes (Signed)
Orthopedic Tech Progress Note Patient Details:  Dana InaConnie Holland [birth year] [id number]  Ortho Devices Type of Ortho Device: Arm sling Ortho Device/Splint Location: lue Ortho Device/Splint Interventions: Application   Nikki DomCrawford, Mekisha Bittel 05/23/2016, 9:34 AM Viewed order from doctor's order list

## 2016-05-24 ENCOUNTER — Encounter (HOSPITAL_COMMUNITY): Payer: Self-pay | Admitting: Neurosurgery

## 2016-09-12 ENCOUNTER — Other Ambulatory Visit: Payer: Self-pay | Admitting: Family Medicine

## 2016-11-09 ENCOUNTER — Other Ambulatory Visit: Payer: Self-pay | Admitting: Family Medicine

## 2016-11-20 ENCOUNTER — Encounter (HOSPITAL_COMMUNITY): Payer: Self-pay

## 2016-11-20 ENCOUNTER — Emergency Department (HOSPITAL_COMMUNITY)
Admission: EM | Admit: 2016-11-20 | Discharge: 2016-11-21 | Disposition: A | Discharge status: Home / Self Care | Payer: Medicaid Other | Attending: Emergency Medicine | Admitting: Emergency Medicine

## 2016-11-20 ENCOUNTER — Telehealth: Payer: Self-pay | Admitting: Family Medicine

## 2016-11-20 ENCOUNTER — Other Ambulatory Visit: Payer: Self-pay

## 2016-11-20 ENCOUNTER — Emergency Department (HOSPITAL_COMMUNITY): Payer: Medicaid Other

## 2016-11-20 DIAGNOSIS — Z7982 Long term (current) use of aspirin: Secondary | ICD-10-CM | POA: Insufficient documentation

## 2016-11-20 DIAGNOSIS — I10 Essential (primary) hypertension: Secondary | ICD-10-CM | POA: Diagnosis not present

## 2016-11-20 DIAGNOSIS — F410 Panic disorder [episodic paroxysmal anxiety] without agoraphobia: Secondary | ICD-10-CM | POA: Insufficient documentation

## 2016-11-20 DIAGNOSIS — Z79899 Other long term (current) drug therapy: Secondary | ICD-10-CM | POA: Diagnosis not present

## 2016-11-20 DIAGNOSIS — Z733 Stress, not elsewhere classified: Secondary | ICD-10-CM | POA: Insufficient documentation

## 2016-11-20 DIAGNOSIS — J449 Chronic obstructive pulmonary disease, unspecified: Secondary | ICD-10-CM | POA: Diagnosis not present

## 2016-11-20 DIAGNOSIS — F439 Reaction to severe stress, unspecified: Secondary | ICD-10-CM

## 2016-11-20 DIAGNOSIS — R002 Palpitations: Secondary | ICD-10-CM | POA: Diagnosis present

## 2016-11-20 DIAGNOSIS — F17210 Nicotine dependence, cigarettes, uncomplicated: Secondary | ICD-10-CM | POA: Diagnosis not present

## 2016-11-20 LAB — BASIC METABOLIC PANEL
ANION GAP: 11 (ref 5–15)
BUN: 9 mg/dL (ref 6–20)
CO2: 20 mmol/L — ABNORMAL LOW (ref 22–32)
Calcium: 8.9 mg/dL (ref 8.9–10.3)
Chloride: 103 mmol/L (ref 101–111)
Creatinine, Ser: 0.79 mg/dL (ref 0.44–1.00)
Glucose, Bld: 100 mg/dL — ABNORMAL HIGH (ref 65–99)
POTASSIUM: 3.7 mmol/L (ref 3.5–5.1)
SODIUM: 134 mmol/L — AB (ref 135–145)

## 2016-11-20 LAB — CBC
HEMATOCRIT: 44.2 % (ref 36.0–46.0)
Hemoglobin: 15.1 g/dL — ABNORMAL HIGH (ref 12.0–15.0)
MCH: 33.7 pg (ref 26.0–34.0)
MCHC: 34.2 g/dL (ref 30.0–36.0)
MCV: 98.7 fL (ref 78.0–100.0)
Platelets: 238 10*3/uL (ref 150–400)
RBC: 4.48 MIL/uL (ref 3.87–5.11)
RDW: 14.1 % (ref 11.5–15.5)
WBC: 16.1 10*3/uL — ABNORMAL HIGH (ref 4.0–10.5)

## 2016-11-20 LAB — I-STAT TROPONIN, ED: Troponin i, poc: 0.02 ng/mL (ref 0.00–0.08)

## 2016-11-20 IMAGING — DX DG CHEST 2V
2 series · 2 of 2 positions shown · non-contrast
Comparison: [DATE]

CLINICAL DATA: Palpitations and substernal chest pressure tonight.

EXAM:
CHEST  2 VIEW

[w chest pa]
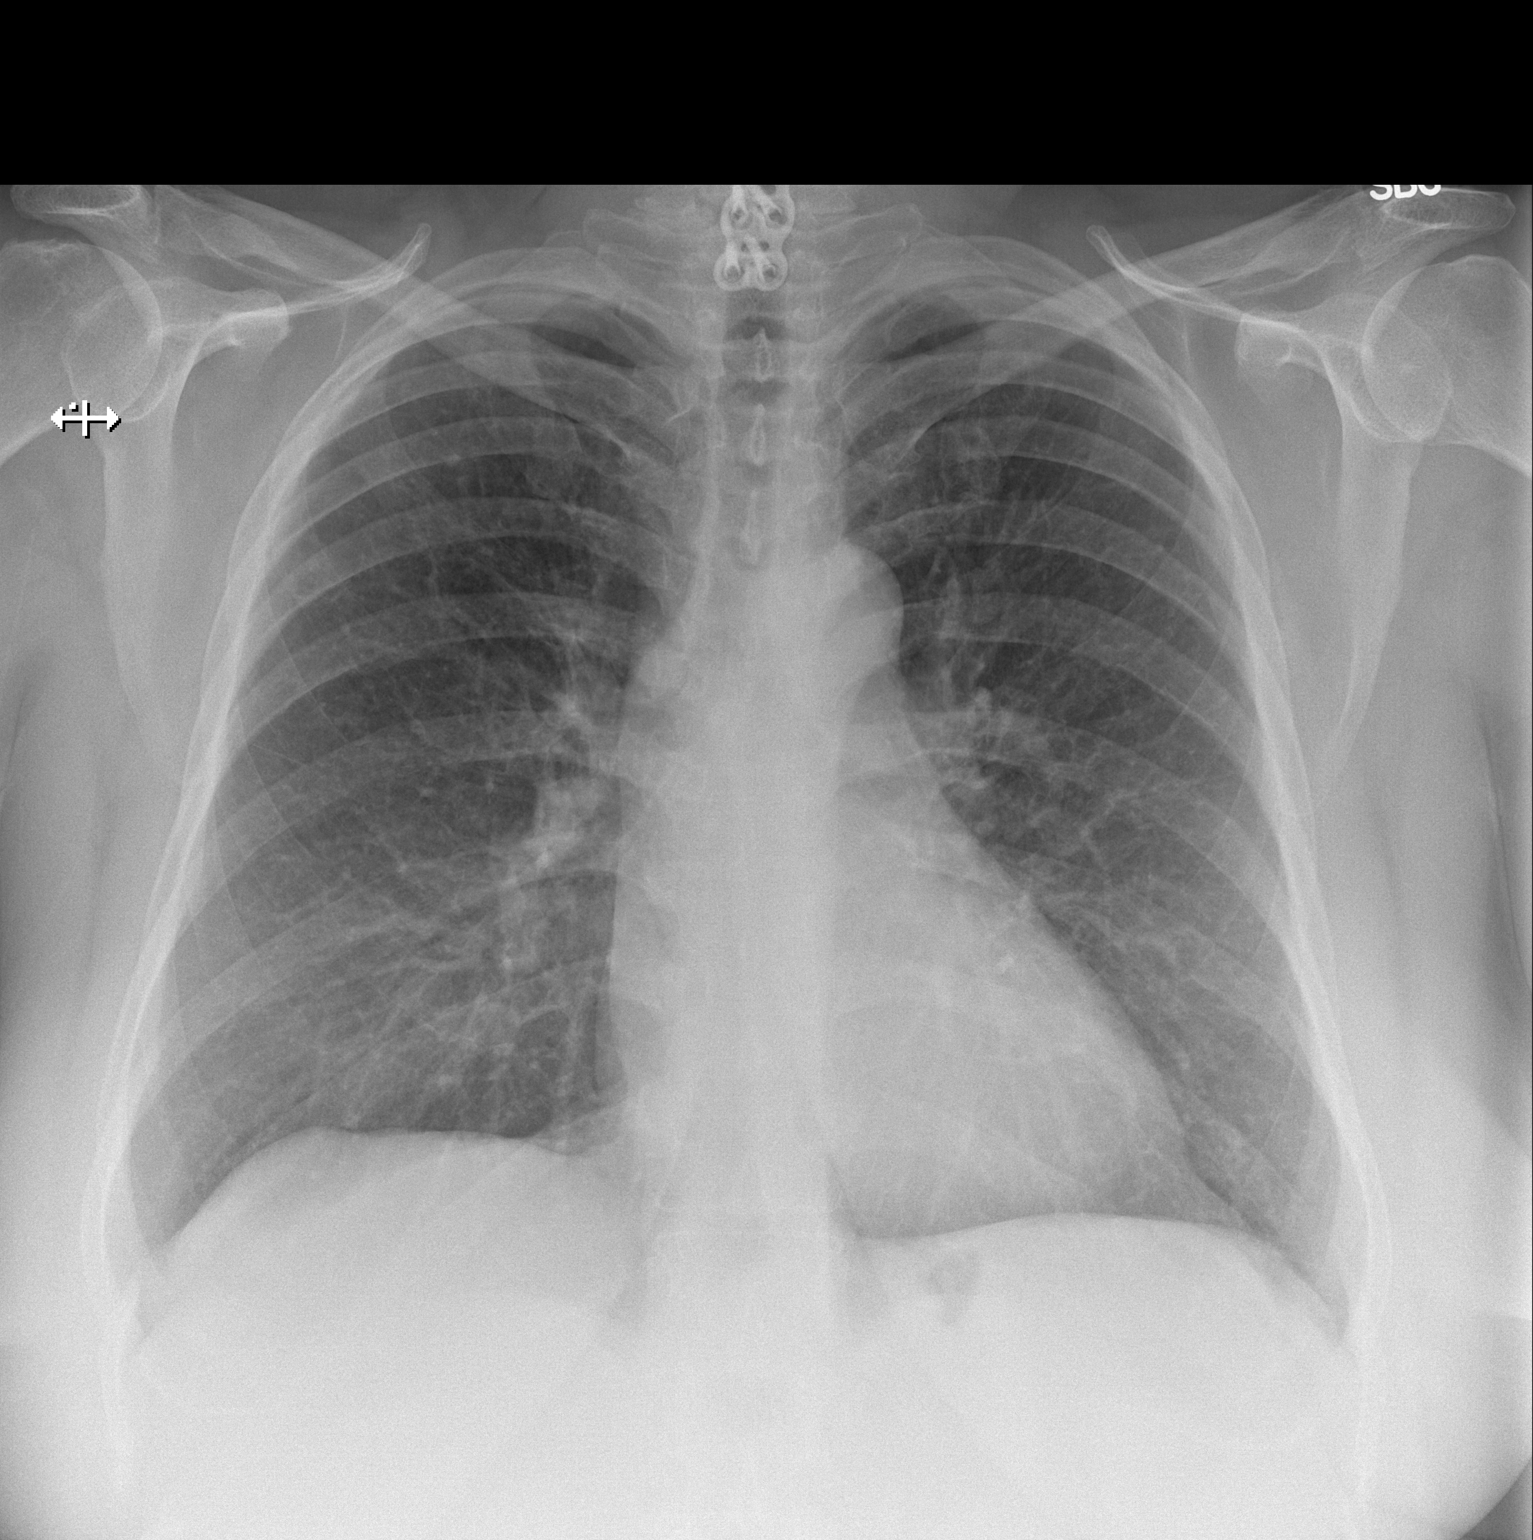

[w chest lat]
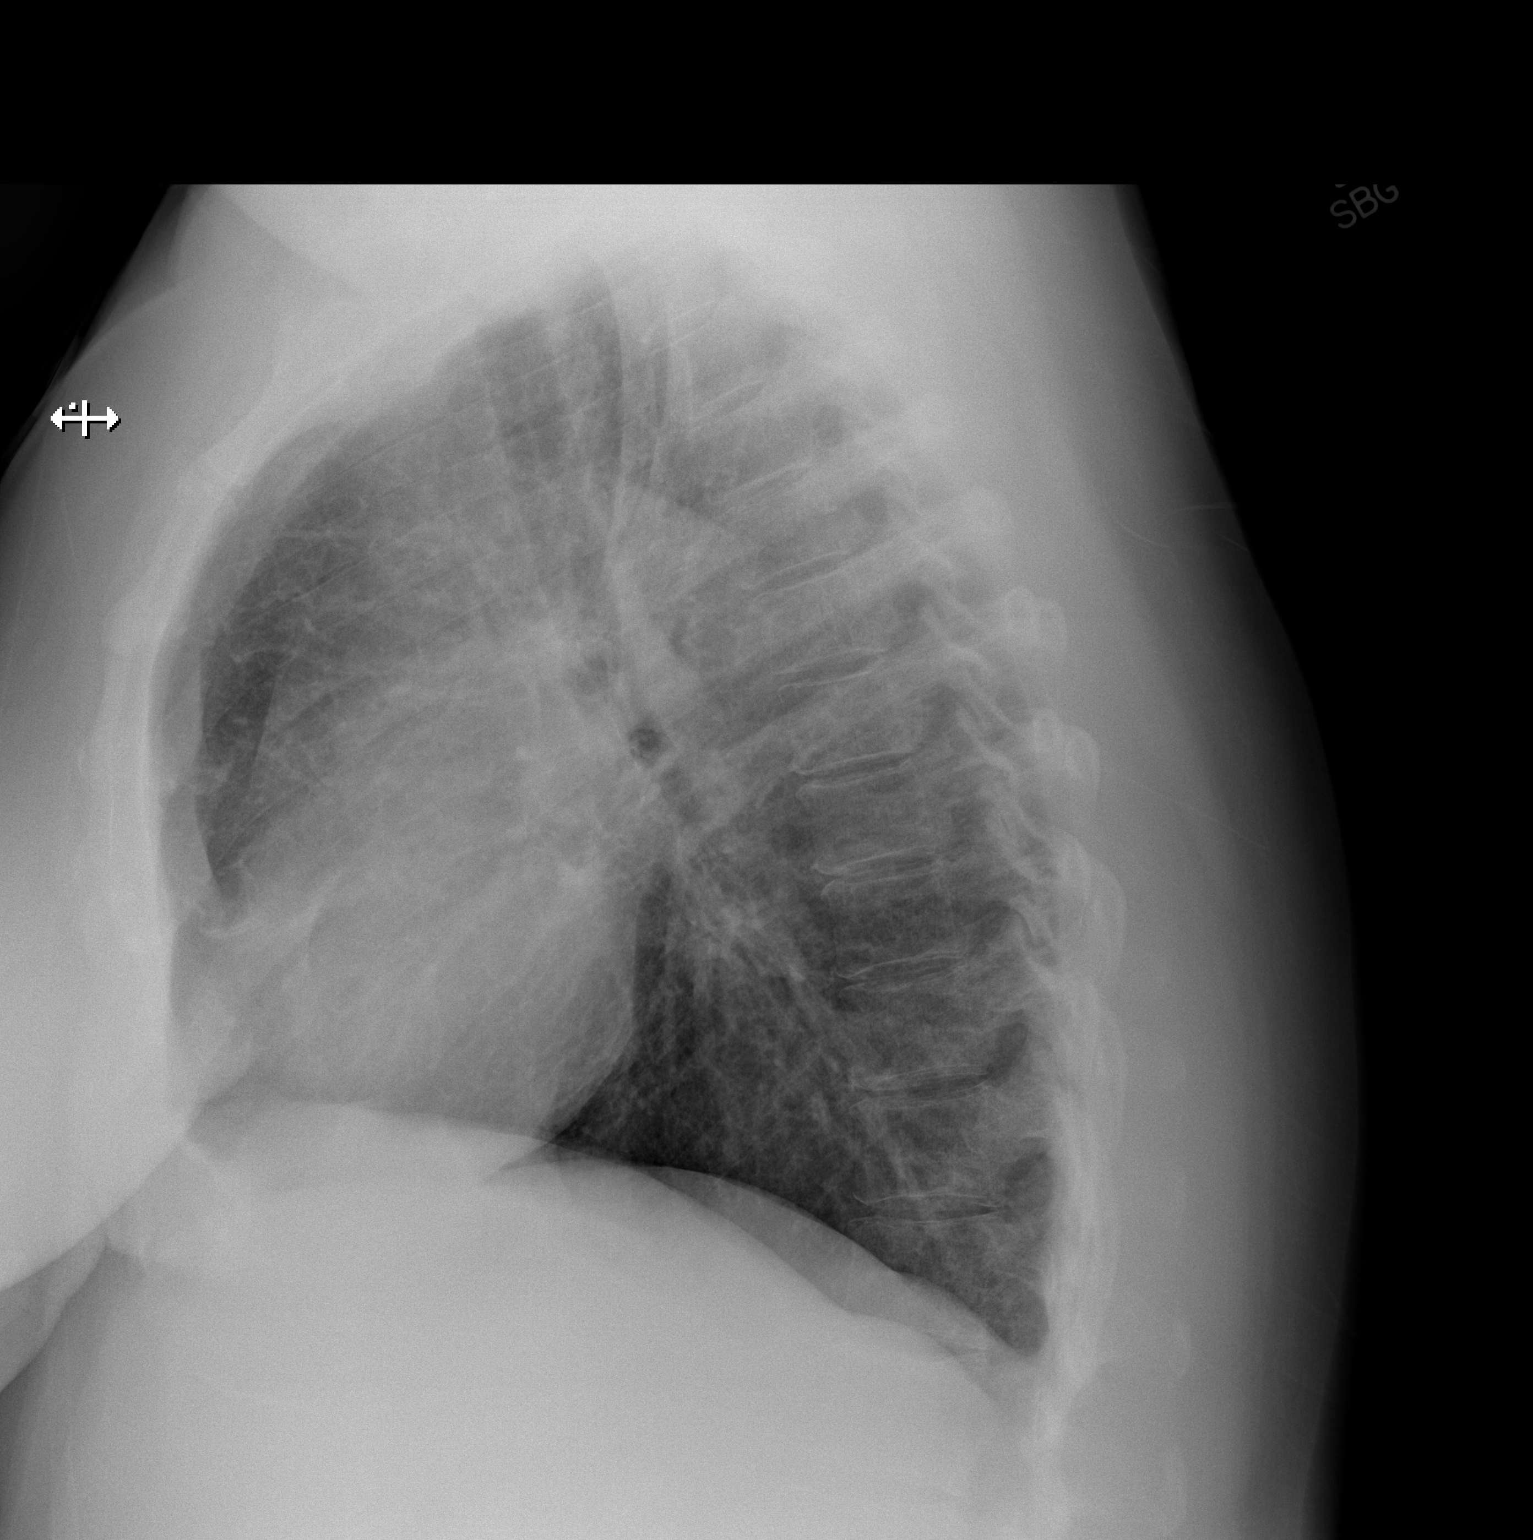

[2 of 2 positions shown; findings below may reference images not displayed]

FINDINGS: The lungs are clear. The pulmonary vasculature is normal. Heart size
is normal. Hilar and mediastinal contours are unremarkable. There is
no pleural effusion.
IMPRESSION: No active cardiopulmonary disease.

## 2016-11-20 NOTE — ED Triage Notes (Signed)
Pt states she had palpitations episode on Tuesday with tingling in face and pale look; pt states she had tightness in neck at the time; pt states 3-4 hours after she had chest pain which went away; pt c/o weakness for a couple days afterward; pt states she called Md today because tightness in chest and pressure in neck with palpitations started back today; Pt denies chest pian on arrival. Pt a&ox 4 on arrival.

## 2016-11-20 NOTE — Telephone Encounter (Signed)
Patient talked to triage °

## 2016-11-20 NOTE — ED Provider Notes (Signed)
[facility]-EMERGENCY DEPT Provider Note   CSN: [id number] Arrival date & time: 11/20/16  2125   By signing my name below, I, Talbert Nan, attest that this documentation has been prepared under the direction and in the presence of Mancel Bale, MD. Electronically Signed: Talbert Nan, Scribe. 11/20/16. 11:53 PM.   History   Chief Complaint Chief Complaint  Patient presents with  . Chest Pain  . Palpitations    HPI Dana Holland is a 56 y.o. female who presents to the Emergency Department complaining of an episode of heart palpitations 7 days ago. Pt had associated anxiety, chest pain, chest tightness, weakness. Pt has had these episodes before. Pt used to hyperventilate. Pt was diagnosed as bipolar. Pt was medicated and stopped taking her medications. Pt has not seen therapist since October 2017. [facility] is PCP but could not get appointment today. October 2017 was last appointment. Pt is a smoker and trying to quite. Pt has right hand, neck, and back surgery. Pt states that she is under a lot of stress. Family, oldest son is schizophrenic. Youngest daughter has 5 babies from 53 yrs to 51 mos old, which she takes care of. Pt was taking Vicodin 325 x 3 per day and quit.   The history is provided by the patient. No language interpreter was used.    Past Medical History:  Diagnosis Date  . Anxiety   . Asthma   . Bipolar disorder (HCC)    takes Lamictal daily  . Chronic back pain   . Chronic back pain   . COPD (chronic obstructive pulmonary disease) (HCC)   . DDD (degenerative disc disease)   . DDD (degenerative disc disease), lumbar   . DDD (degenerative disc disease), lumbar   . Depression    takes Prozac daily  . DJD (degenerative joint disease)   . Fibromyalgia   . Headache    migraines  . History of kidney stones   . History of palpitations    "I was checked out by Dr. Antoine Poche and everything okay"  . Hypertension   . Insomnia    takes Ambien nightly  .  Lumbar stenosis with neurogenic claudication   . Neck pain   . Seasonal allergies   . Shortness of breath dyspnea    with exertion    Patient Active Problem List   Diagnosis Date Noted  . Bipolar disease, chronic (HCC) 05/21/2016  . Elevated blood-pressure reading without diagnosis of hypertension 05/21/2016  . Lumbar stenosis with neurogenic claudication 01/01/2016  . HNP (herniated nucleus pulposus), cervical 08/03/2015    Past Surgical History:  Procedure Laterality Date  . ABDOMINAL EXPLORATION SURGERY    . ABDOMINAL HYSTERECTOMY    . ANTERIOR CERVICAL DECOMP/DISCECTOMY FUSION N/A 08/03/2015   Procedure: ACDF C5-C6  C6-C7;  Surgeon: Shirlean Kelly, MD;  Location: [facility];  Service: Neurosurgery;  Laterality: N/A;  ACDF C5-C6  C6-C7  . BACK SURGERY     lower back  . BREAST SURGERY    . CARPAL TUNNEL RELEASE Right 11/29/2015   Procedure: CARPAL TUNNEL RELEASE-Right;  Surgeon: Shirlean Kelly, MD;  Location: [facility];  Service: Neurosurgery;  Laterality: Right;  CARPAL TUNNEL RELEASE-Right  . CARPAL TUNNEL RELEASE Left 05/23/2016   Procedure: Left CARPAL TUNNEL RELEASE with Bier block;  Surgeon: Shirlean Kelly, MD;  Location: [facility];  Service: Neurosurgery;  Laterality: Left;  Left CARPAL TUNNEL RELEASE with Bier block  . CHOLECYSTECTOMY    . DILATION AND CURETTAGE OF UTERUS    .  KNEE SURGERY    . MULTIPLE TOOTH EXTRACTIONS    . TUBAL LIGATION      OB History    No data available       Home Medications    Prior to Admission medications   Medication Sig Start Date End Date Taking? Authorizing Provider  aspirin 325 MG EC tablet Take 325 mg by mouth daily as needed for pain.    Historical Provider, MD  cyclobenzaprine (FLEXERIL) 10 MG tablet Take 0.5-1 tablets (5-10 mg total) by mouth 3 (three) times daily as needed for muscle spasms. 08/04/15   Shirlean Kelly, MD  diphenhydrAMINE (BENADRYL) 25 MG tablet Take 25 mg by mouth every 6 (six) hours as needed for  allergies.    Historical Provider, MD  FLUoxetine (PROZAC) 40 MG capsule Take 40 mg by mouth daily.    Historical Provider, MD  fluticasone (FLONASE) 50 MCG/ACT nasal spray Place 1 spray into both nostrils daily as needed for allergies. 05/21/16   Frederica Kuster, MD  gabapentin (NEURONTIN) 600 MG tablet Take 600-1,200 mg by mouth 3 (three) times daily. 600 mg twice a day and 1200 mg at bedtime 11/17/15   Historical Provider, MD  HYDROcodone-acetaminophen (NORCO) 10-325 MG tablet Take 1 tablet by mouth every 4 (four) hours as needed for moderate pain.     Historical Provider, MD  lisinopril (PRINIVIL,ZESTRIL) 10 MG tablet TAKE 1 TABLET (10 MG TOTAL) BY MOUTH DAILY. 11/11/16   Mechele Claude, MD  naproxen sodium (ANAPROX) 220 MG tablet Take 440 mg by mouth daily as needed (for pain).    Historical Provider, MD  omega-3 acid ethyl esters (LOVAZA) 1 g capsule Take 1 g by mouth daily.     Historical Provider, MD  PROVENTIL HFA 108 (90 Base) MCG/ACT inhaler INHALE 2 PUFFS INTO THE LUNGS EVERY 6 (SIX) HOURS AS NEEDED FOR WHEEZING OR SHORTNESS OF BREATH. 11/11/16   Mechele Claude, MD  zolpidem (AMBIEN CR) 6.25 MG CR tablet Take 6.25 mg by mouth at bedtime as needed for sleep.    Historical Provider, MD    Family History Family History  Problem Relation Age of Onset  . Heart disease Mother   . Arthritis Mother   . Hyperlipidemia Mother   . Hypertension Father   . Heart disease Father   . Diabetes Father   . Hyperlipidemia Father   . Hypertension Other   . Heart disease Other   . Arthritis Sister   . COPD Sister   . Depression Sister   . Hyperlipidemia Sister   . Hyperlipidemia Brother   . Hypertension Brother   . Asthma Son   . Arthritis Maternal Grandmother   . Diabetes Maternal Grandmother   . Heart disease Maternal Grandmother   . Heart disease Maternal Grandfather   . Arthritis Paternal Grandmother   . Heart disease Paternal Grandmother   . Heart disease Paternal Grandfather      Social History Social History  Substance Use Topics  . Smoking status: Current Every Day Smoker    Packs/day: 0.25    Years: 40.00    Types: Cigarettes, E-cigarettes    Last attempt to quit: 04/30/2015  . Smokeless tobacco: Never Used     Comment: currently uses Vapor 12/25/15, 5 cigarettes per day currently 04/03/16  . Alcohol use No     Allergies   Imitrex [sumatriptan]; Remeron [mirtazapine]; Wellbutrin [bupropion]; Ibuprofen; Keflex [cephalexin]; Nsaids; Paroxetine hcl; Toradol [ketorolac tromethamine]; Vioxx [rofecoxib]; Bextra [valdecoxib]; Buspar [buspirone]; Compazine [prochlorperazine edisylate]; Cymbalta [duloxetine  hcl]; Effexor [venlafaxine]; Sulfa antibiotics; Voltaren [diclofenac sodium]; and Tramadol   Review of Systems Review of Systems  All other systems reviewed and are negative.    Physical Exam Updated Vital Signs BP 124/76   Pulse 87   Temp 98.5 F (36.9 C)   Resp (!) 23   SpO2 93%   Physical Exam  Constitutional: She is oriented to person, place, and time. She appears well-developed and well-nourished.  HENT:  Head: Normocephalic and atraumatic.  Eyes: Conjunctivae and EOM are normal. Pupils are equal, round, and reactive to light.  Neck: Normal range of motion and phonation normal. Neck supple.  Cardiovascular: Normal rate and regular rhythm.   Pulmonary/Chest: Effort normal and breath sounds normal. She exhibits no tenderness.  Abdominal: Soft. She exhibits no distension. There is no tenderness. There is no guarding.  Musculoskeletal: Normal range of motion.  Neurological: She is alert and oriented to person, place, and time. She exhibits normal muscle tone.  Skin: Skin is warm and dry.  Psychiatric: She has a normal mood and affect. Her behavior is normal. Judgment and thought content normal.  Nursing note and vitals reviewed.    ED Treatments / Results   DIAGNOSTIC STUDIES: Oxygen Saturation is 96% on room air, adequate by my  interpretation.    COORDINATION OF CARE: 11:50 PM Discussed treatment plan with pt at bedside and pt agreed to plan, which includes talking to a counselor.    Labs (all labs ordered are listed, but only abnormal results are displayed) Labs Reviewed  BASIC METABOLIC PANEL - Abnormal; Notable for the following:       Result Value   Sodium 134 (*)    CO2 20 (*)    Glucose, Bld 100 (*)    All other components within normal limits  CBC - Abnormal; Notable for the following:    WBC 16.1 (*)    Hemoglobin 15.1 (*)    All other components within normal limits  I-STAT TROPOININ, ED    EKG    EKG Interpretation  Date/Time:  Wednesday Nov 20 2016 21:31:43 EDT Ventricular Rate:  92 PR Interval:  150 QRS Duration: 106 QT Interval:  346 QTC Calculation: 427 R Axis:   49 Text Interpretation:  Normal sinus rhythm Normal ECG Confirmed by Zadie Rhine ([id number]) on 11/21/2016 9:44:32 AM         Radiology Dg Chest 2 View  Result Date: 11/20/2016 CLINICAL DATA:  Palpitations and substernal chest pressure tonight. EXAM: CHEST  2 VIEW COMPARISON:  11/11/2009 FINDINGS: The lungs are clear. The pulmonary vasculature is normal. Heart size is normal. Hilar and mediastinal contours are unremarkable. There is no pleural effusion. IMPRESSION: No active cardiopulmonary disease. Electronically Signed   By: Ellery Plunk M.D.   On: 11/20/2016 22:04    Procedures Procedures (including critical care time)  Medications Ordered in ED Medications - No data to display   Initial Impression / Assessment and Plan / ED Course  I have reviewed the triage vital signs and the nursing notes.  Pertinent labs & imaging results that were available during my care of the patient were reviewed by me and considered in my medical decision making (see chart for details).  Clinical Course as of Nov 22 754  Thu Nov 21, 2016  0018 ED EKG within 10 minutes [EW]    Clinical Course User Index [EW] Mancel Bale, MD    Medical screening examination/treatment/procedure(s) were performed by non-physician practitioner and as supervising physician I was  immediately available for consultation/collaboration.   EKG Interpretation None       Final Clinical Impressions(s) / ED Diagnoses   Final diagnoses:  Panic attack  Stress at home    Elevation consistent with panic attack, likely secondary to stress.  No indication for further intervention at this time.  No indication for starting medications, now.  Nursing Notes Reviewed/ Care Coordinated Applicable Imaging Reviewed Interpretation of Laboratory Data incorporated into ED treatment  The patient appears reasonably screened and/or stabilized for discharge and I doubt any other medical condition or other [facility] requiring further screening, evaluation, or treatment in the ED at this time prior to discharge.  Plan: Home Medications-continue usual; Home Treatments-rest, stress management at home; return here if the recommended treatment, does not improve the symptoms; Recommended follow up-PCP checkup as needed and follow-up with a therapist regarding stress management     New Prescriptions Discharge Medication List as of 11/21/2016 12:05 AM     I personally performed the services described in this documentation, which was scribed in my presence. The recorded information has been reviewed and is accurate.       Mancel Bale, MD 11/21/16 [phone]

## 2016-11-21 NOTE — Discharge Instructions (Signed)
Follow up with your therapist for stress management.  Return here if needed for problems.

## 2016-12-02 ENCOUNTER — Other Ambulatory Visit: Payer: Self-pay | Admitting: Family Medicine

## 2016-12-31 ENCOUNTER — Other Ambulatory Visit: Payer: Self-pay | Admitting: Family Medicine

## 2017-01-08 ENCOUNTER — Other Ambulatory Visit: Payer: Self-pay | Admitting: Family Medicine

## 2017-02-05 ENCOUNTER — Other Ambulatory Visit: Payer: Self-pay | Admitting: Family Medicine

## 2017-03-05 ENCOUNTER — Other Ambulatory Visit: Payer: Self-pay | Admitting: Family Medicine

## 2017-03-13 ENCOUNTER — Other Ambulatory Visit: Payer: Self-pay | Admitting: Family Medicine

## 2017-03-22 ENCOUNTER — Other Ambulatory Visit: Payer: Self-pay | Admitting: Family Medicine

## 2017-03-27 ENCOUNTER — Other Ambulatory Visit: Payer: Self-pay | Admitting: Family Medicine

## 2017-03-28 NOTE — Telephone Encounter (Signed)
Please let patient know RX was sent by Dr Ermalinda MemosBradshaw on 9/4

## 2017-03-28 NOTE — Telephone Encounter (Signed)
Please have patient schedule appt for follow up on HTN.  Refill sent to pharmacy

## 2017-03-28 NOTE — Telephone Encounter (Signed)
last seen 05/21/16  Dr Hyacinth MeekerMiller

## 2017-04-08 ENCOUNTER — Other Ambulatory Visit: Payer: Self-pay | Admitting: Pediatrics

## 2017-04-16 ENCOUNTER — Other Ambulatory Visit: Payer: Self-pay | Admitting: Pediatrics

## 2017-04-16 NOTE — Telephone Encounter (Signed)
Last seen 10.31/17  Dr Miller 

## 2017-05-15 ENCOUNTER — Other Ambulatory Visit: Payer: Self-pay | Admitting: Family Medicine

## 2017-06-13 ENCOUNTER — Other Ambulatory Visit: Payer: Self-pay | Admitting: Family Medicine

## 2018-02-24 ENCOUNTER — Other Ambulatory Visit: Payer: Self-pay

## 2018-02-24 ENCOUNTER — Emergency Department (HOSPITAL_COMMUNITY): Payer: Medicaid Other

## 2018-02-24 ENCOUNTER — Encounter (HOSPITAL_COMMUNITY): Payer: Self-pay

## 2018-02-24 ENCOUNTER — Emergency Department (HOSPITAL_COMMUNITY)
Admission: EM | Admit: 2018-02-24 | Discharge: 2018-02-24 | Disposition: A | Discharge status: Home / Self Care | Payer: Medicaid Other | Attending: Emergency Medicine | Admitting: Emergency Medicine

## 2018-02-24 DIAGNOSIS — Y9389 Activity, other specified: Secondary | ICD-10-CM | POA: Diagnosis not present

## 2018-02-24 DIAGNOSIS — S299XXA Unspecified injury of thorax, initial encounter: Secondary | ICD-10-CM | POA: Diagnosis present

## 2018-02-24 DIAGNOSIS — R071 Chest pain on breathing: Secondary | ICD-10-CM | POA: Diagnosis not present

## 2018-02-24 DIAGNOSIS — S29012A Strain of muscle and tendon of back wall of thorax, initial encounter: Secondary | ICD-10-CM | POA: Diagnosis not present

## 2018-02-24 DIAGNOSIS — Z9049 Acquired absence of other specified parts of digestive tract: Secondary | ICD-10-CM | POA: Insufficient documentation

## 2018-02-24 DIAGNOSIS — J45909 Unspecified asthma, uncomplicated: Secondary | ICD-10-CM | POA: Insufficient documentation

## 2018-02-24 DIAGNOSIS — F17210 Nicotine dependence, cigarettes, uncomplicated: Secondary | ICD-10-CM | POA: Insufficient documentation

## 2018-02-24 DIAGNOSIS — Z79899 Other long term (current) drug therapy: Secondary | ICD-10-CM | POA: Diagnosis not present

## 2018-02-24 DIAGNOSIS — F419 Anxiety disorder, unspecified: Secondary | ICD-10-CM | POA: Insufficient documentation

## 2018-02-24 DIAGNOSIS — F319 Bipolar disorder, unspecified: Secondary | ICD-10-CM | POA: Insufficient documentation

## 2018-02-24 DIAGNOSIS — Y929 Unspecified place or not applicable: Secondary | ICD-10-CM | POA: Diagnosis not present

## 2018-02-24 DIAGNOSIS — I10 Essential (primary) hypertension: Secondary | ICD-10-CM | POA: Diagnosis not present

## 2018-02-24 DIAGNOSIS — Y998 Other external cause status: Secondary | ICD-10-CM | POA: Diagnosis not present

## 2018-02-24 DIAGNOSIS — S29019A Strain of muscle and tendon of unspecified wall of thorax, initial encounter: Secondary | ICD-10-CM

## 2018-02-24 DIAGNOSIS — X509XXA Other and unspecified overexertion or strenuous movements or postures, initial encounter: Secondary | ICD-10-CM | POA: Insufficient documentation

## 2018-02-24 IMAGING — DX DG RIBS BILAT 3V
8 series · 8 of 8 positions shown · non-contrast
Comparison: Cervical spine and lumbar spine radiographs from
[DATE] and [DATE] respectively. CXR from [DATE].

CLINICAL DATA: Pain after cough.

EXAM:
BILATERAL RIBS - 3+ VIEW

[rib pa obl (1 of 4)]
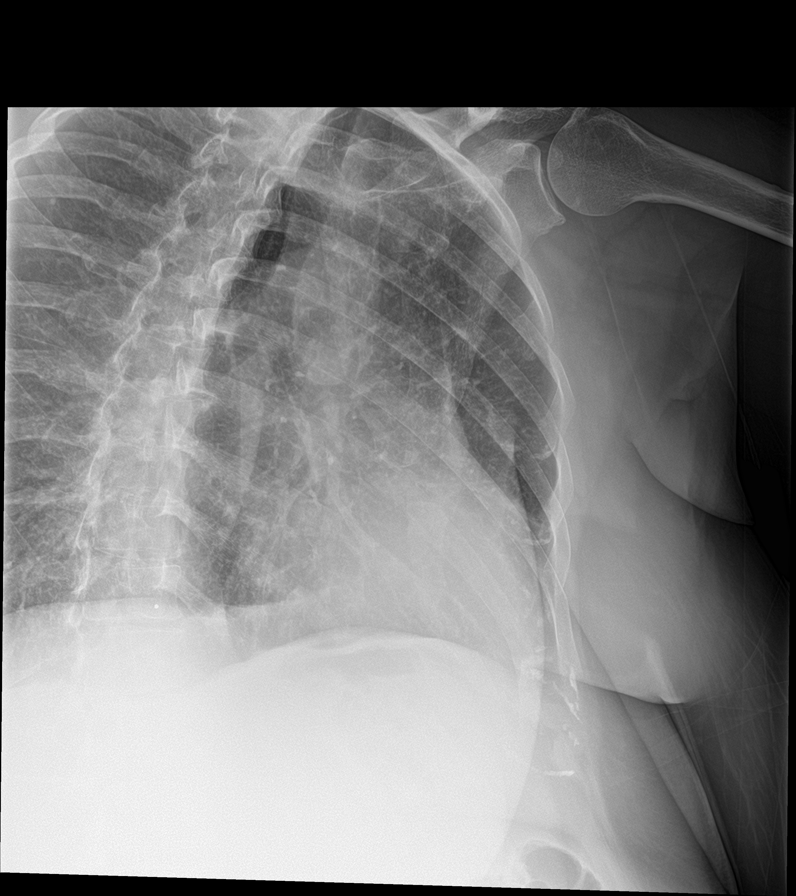

[rib pa obl (2 of 4)]
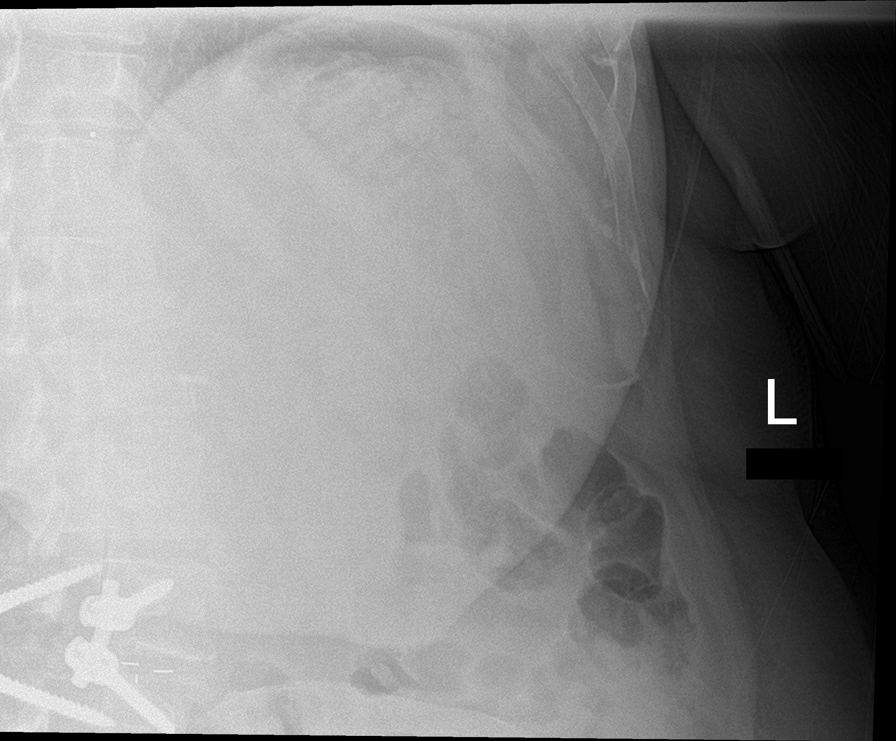

[rib ap (1 of 4)]
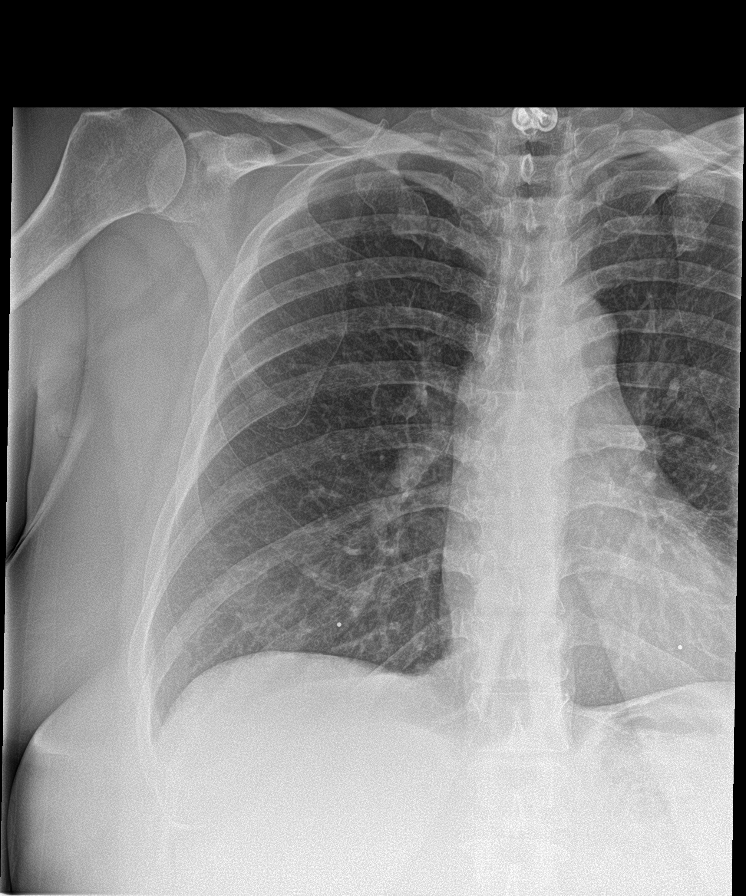

[rib ap (2 of 4)]
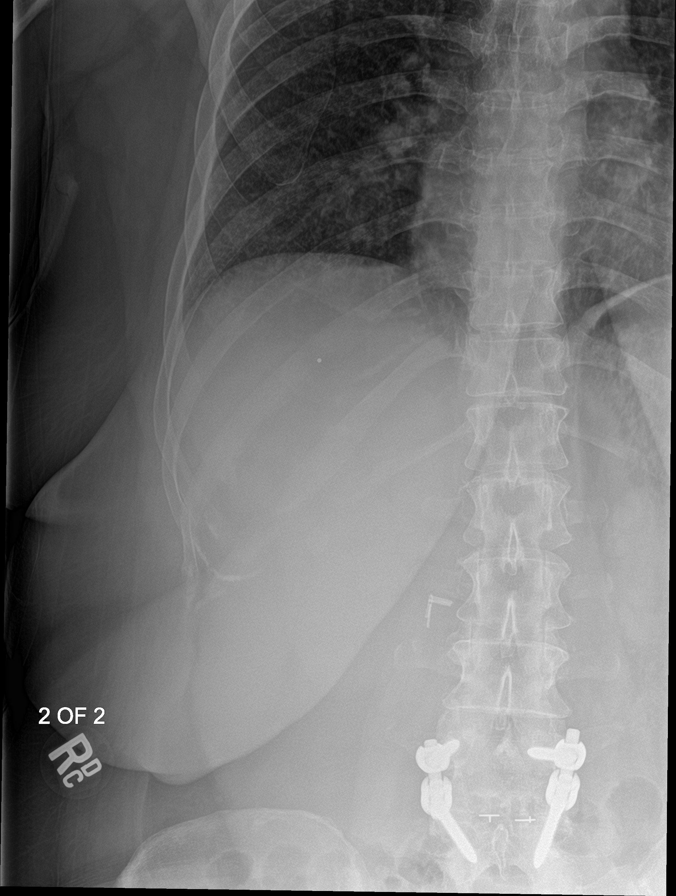

[rib ap (3 of 4)]
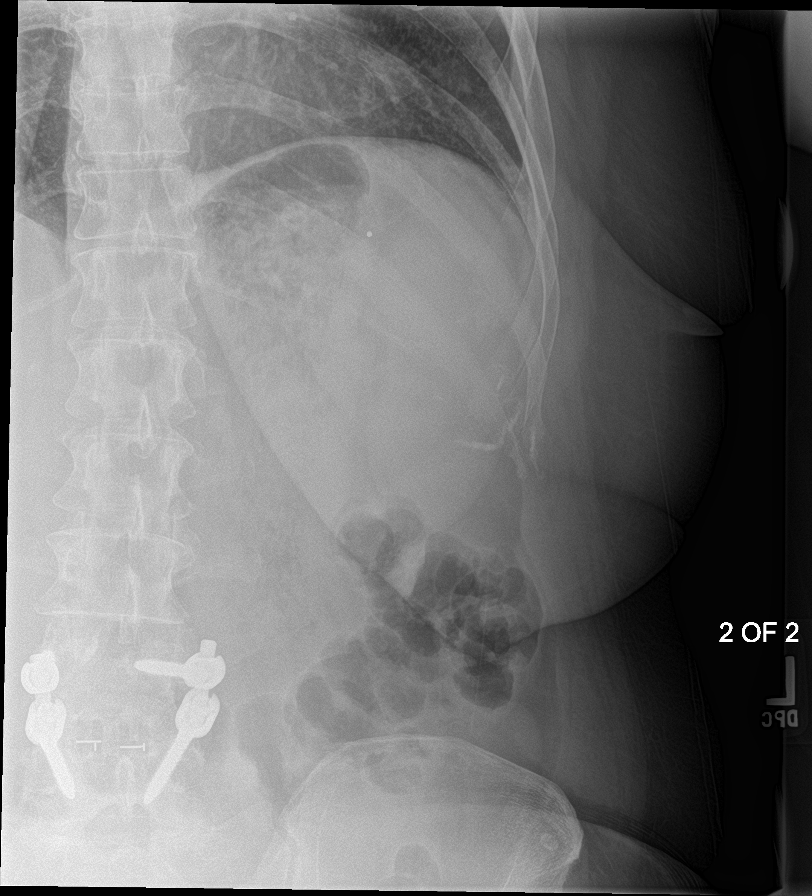

[rib ap (4 of 4)]
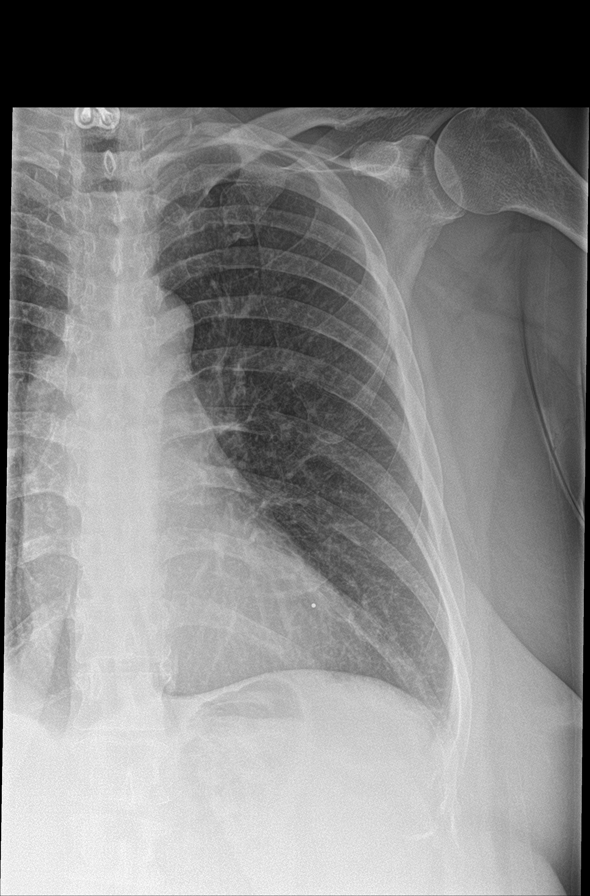

[rib pa obl (3 of 4)]
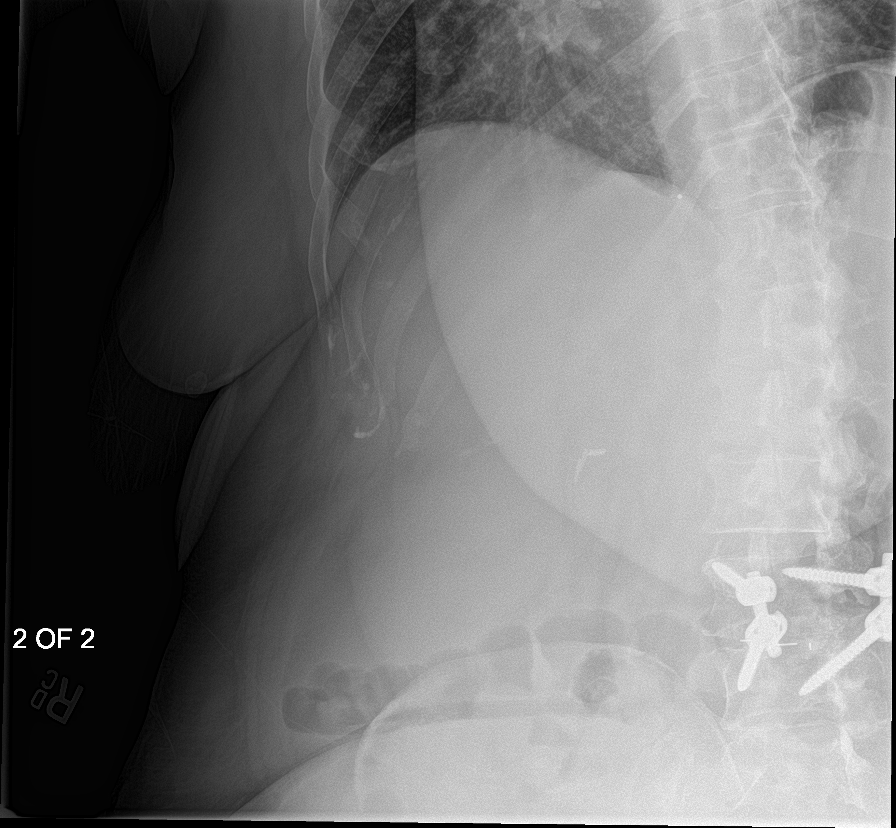

[rib pa obl (4 of 4)]
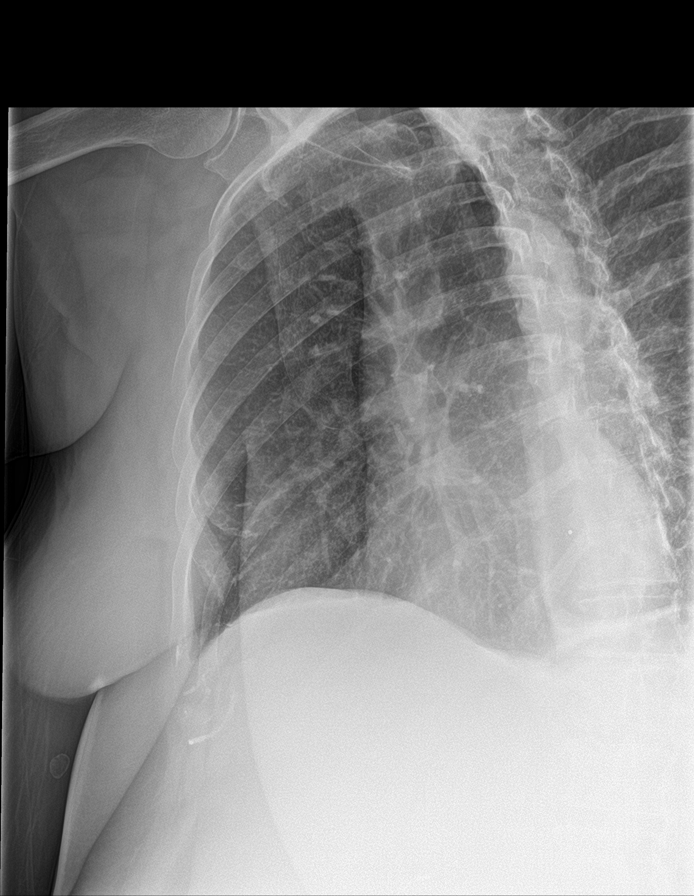

[8 of 8 positions shown; findings below may reference images not displayed]

FINDINGS: Multiple views of the ribs demonstrate no acute displaced fracture
or suspicious osseous lesions. No adjacent pulmonary consolidation,
effusion or pneumothorax is noted. The patient is status post lower
cervical ACDF from C5 through C7 as well as lower lumbar spinal
fusion at L4-5.
IMPRESSION: No acute fracture of the ribs.  No suspicious osseous lesions.

## 2018-02-24 MED ORDER — NAPROXEN 500 MG PO TABS: 500.0000 mg | ORAL_TABLET | Freq: Two times a day (BID) | ORAL | 0 refills | Status: DC

## 2018-02-24 MED ORDER — OXYCODONE-ACETAMINOPHEN 5-325 MG PO TABS: 1.0000 | ORAL_TABLET | ORAL | 0 refills | Status: DC | PRN

## 2018-02-24 MED ORDER — CYCLOBENZAPRINE HCL 10 MG PO TABS
10.0000 mg | ORAL_TABLET | Freq: Once | ORAL | Status: AC
  Administered 2018-02-24: 10 mg via ORAL
  Filled 2018-02-24: qty 1

## 2018-02-24 MED ORDER — NAPROXEN 250 MG PO TABS
500.0000 mg | ORAL_TABLET | Freq: Once | ORAL | Status: AC
  Administered 2018-02-24: 500 mg via ORAL
  Filled 2018-02-24: qty 2

## 2018-02-24 MED ORDER — CYCLOBENZAPRINE HCL 10 MG PO TABS: 10.0000 mg | ORAL_TABLET | Freq: Three times a day (TID) | ORAL | 0 refills | Status: DC | PRN

## 2018-02-24 MED ORDER — OXYCODONE-ACETAMINOPHEN 5-325 MG PO TABS
1.0000 | ORAL_TABLET | Freq: Once | ORAL | Status: AC
  Administered 2018-02-24: 1 via ORAL
  Filled 2018-02-24: qty 1

## 2018-02-24 NOTE — ED Triage Notes (Signed)
Pt reports mid back pain started yesterday and getting choked and coughing hard. Denies falling or injuries. Pt has hx of chronic back pain.

## 2018-02-24 NOTE — ED Provider Notes (Signed)
[facility] EMERGENCY DEPARTMENT Provider Note   CSN: [id number] Arrival date & time: 02/24/18  0015     History   Chief Complaint Chief Complaint  Patient presents with  . Back Pain    HPI Dana Holland is a 57 y.o. female.  The history is provided by the patient.  She has history of hypertension, bipolar disorder, asthma, chronic back pain and comes in with pain in her mid back with radiation to the bilateral chest.  She states that yesterday, she was drinking some water and choked on the and felt something pop in her mid back as she was coughing.  She had severe pain in her mid back which radiated around to the right yesterday, and today is radiating around to the left as well.  Pain is worse with breathing and worse with laying flat.  Nothing makes it better.  She denies weakness, numbness, tingling.  She denies bowel or bladder dysfunction.  Pain is rated at 8/10.  Past Medical History:  Diagnosis Date  . Anxiety   . Asthma   . Bipolar disorder (HCC)    takes Lamictal daily  . Chronic back pain   . Chronic back pain   . COPD (chronic obstructive pulmonary disease) (HCC)   . DDD (degenerative disc disease)   . DDD (degenerative disc disease), lumbar   . DDD (degenerative disc disease), lumbar   . Depression    takes Prozac daily  . DJD (degenerative joint disease)   . Fibromyalgia   . Headache    migraines  . History of kidney stones   . History of palpitations    "I was checked out by Dr. Antoine Poche and everything okay"  . Hypertension   . Insomnia    takes Ambien nightly  . Lumbar stenosis with neurogenic claudication   . Neck pain   . Seasonal allergies   . Shortness of breath dyspnea    with exertion    Patient Active Problem List   Diagnosis Date Noted  . Bipolar disease, chronic (HCC) 05/21/2016  . Elevated blood-pressure reading without diagnosis of hypertension 05/21/2016  . Lumbar stenosis with neurogenic claudication 01/01/2016  . HNP (herniated  nucleus pulposus), cervical 08/03/2015    Past Surgical History:  Procedure Laterality Date  . ABDOMINAL EXPLORATION SURGERY    . ABDOMINAL HYSTERECTOMY    . ANTERIOR CERVICAL DECOMP/DISCECTOMY FUSION N/A 08/03/2015   Procedure: ACDF C5-C6  C6-C7;  Surgeon: Shirlean Kelly, MD;  Location: [facility];  Service: Neurosurgery;  Laterality: N/A;  ACDF C5-C6  C6-C7  . BACK SURGERY     lower back  . BREAST SURGERY    . CARPAL TUNNEL RELEASE Right 11/29/2015   Procedure: CARPAL TUNNEL RELEASE-Right;  Surgeon: Shirlean Kelly, MD;  Location: [facility];  Service: Neurosurgery;  Laterality: Right;  CARPAL TUNNEL RELEASE-Right  . CARPAL TUNNEL RELEASE Left 05/23/2016   Procedure: Left CARPAL TUNNEL RELEASE with Bier block;  Surgeon: Shirlean Kelly, MD;  Location: [facility];  Service: Neurosurgery;  Laterality: Left;  Left CARPAL TUNNEL RELEASE with Bier block  . CHOLECYSTECTOMY    . DILATION AND CURETTAGE OF UTERUS    . KNEE SURGERY    . MULTIPLE TOOTH EXTRACTIONS    . TUBAL LIGATION       OB History   None      Home Medications    Prior to Admission medications   Medication Sig Start Date End Date Taking? Authorizing Provider  aspirin 325 MG EC tablet  Take 325 mg by mouth daily as needed for pain.    [provider]  cyclobenzaprine (FLEXERIL) 10 MG tablet Take 0.5-1 tablets (5-10 mg total) by mouth 3 (three) times daily as needed for muscle spasms. 08/04/15   Shirlean Kelly, MD  diphenhydrAMINE (BENADRYL) 25 MG tablet Take 25 mg by mouth every 6 (six) hours as needed for allergies.    [provider]  FLUoxetine (PROZAC) 40 MG capsule Take 40 mg by mouth daily.    [provider]  fluticasone (FLONASE) 50 MCG/ACT nasal spray PLACE 1 SPRAY INTO BOTH NOSTRILS DAILY AS NEEDED FOR ALLERGIES. 04/16/17   Dettinger, Elige Radon, MD  gabapentin (NEURONTIN) 600 MG tablet Take 600-1,200 mg by mouth 3 (three) times daily. 600 mg twice a day and 1200 mg at bedtime 11/17/15    [provider]  HYDROcodone-acetaminophen (NORCO) 10-325 MG tablet Take 1 tablet by mouth every 4 (four) hours as needed for moderate pain.     [provider]  lisinopril (PRINIVIL,ZESTRIL) 10 MG tablet TAKE 1 TABLET (10 MG TOTAL) BY MOUTH DAILY. 03/25/17   Elenora Gamma, MD  naproxen sodium (ANAPROX) 220 MG tablet Take 440 mg by mouth daily as needed (for pain).    [provider]  omega-3 acid ethyl esters (LOVAZA) 1 g capsule Take 1 g by mouth daily.     [provider]  PROVENTIL HFA 108 (90 Base) MCG/ACT inhaler INHALE 2 PUFFS INTO THE LUNGS EVERY 6 (SIX) HOURS AS NEEDED FOR WHEEZING OR SHORTNESS OF BREATH. 11/11/16   Mechele Claude, MD  zolpidem (AMBIEN CR) 6.25 MG CR tablet Take 6.25 mg by mouth at bedtime as needed for sleep.    [provider]    Family History Family History  Problem Relation Age of Onset  . Heart disease Mother   . Arthritis Mother   . Hyperlipidemia Mother   . Hypertension Father   . Heart disease Father   . Diabetes Father   . Hyperlipidemia Father   . Hypertension Other   . Heart disease Other   . Arthritis Sister   . COPD Sister   . Depression Sister   . Hyperlipidemia Sister   . Hyperlipidemia Brother   . Hypertension Brother   . Asthma Son   . Arthritis Maternal Grandmother   . Diabetes Maternal Grandmother   . Heart disease Maternal Grandmother   . Heart disease Maternal Grandfather   . Arthritis Paternal Grandmother   . Heart disease Paternal Grandmother   . Heart disease Paternal Grandfather     Social History Social History   Tobacco Use  . Smoking status: Current Every Day Smoker    Packs/day: 0.25    Years: 40.00    Pack years: 10.00    Types: Cigarettes, E-cigarettes    Last attempt to quit: 04/30/2015    Years since quitting: 2.8  . Smokeless tobacco: Never Used  . Tobacco comment: currently uses Vapor 12/25/15, 5 cigarettes per day currently 04/03/16  Substance Use Topics  .  Alcohol use: No  . Drug use: No     Allergies   Imitrex [sumatriptan]; Remeron [mirtazapine]; Wellbutrin [bupropion]; Ibuprofen; Keflex [cephalexin]; Nsaids; Paroxetine hcl; Toradol [ketorolac tromethamine]; Vioxx [rofecoxib]; Bextra [valdecoxib]; Buspar [buspirone]; Compazine [prochlorperazine edisylate]; Cymbalta [duloxetine hcl]; Effexor [venlafaxine]; Sulfa antibiotics; Voltaren [diclofenac sodium]; and Tramadol   Review of Systems Review of Systems  All other systems reviewed and are negative.    Physical Exam Updated Vital Signs BP (!) 148/74  Pulse 98   Temp 98.1 F (36.7 C)   Resp 20   Ht 5\' 4"  (1.626 m)   Wt 95.7 kg (211 lb)   SpO2 96%   BMI 36.22 kg/m   Physical Exam  Nursing note and vitals reviewed.  57 year old female, resting comfortably and in no acute distress. Vital signs are significant for elevated systolic blood pressure. Oxygen saturation is 96%, which is normal. Head is normocephalic and atraumatic. PERRLA, EOMI. Oropharynx is clear. Neck is nontender and supple without adenopathy or JVD. Back is nontender, but there is moderate paraspinal muscle spasm bilaterally.  There is no CVA tenderness. Lungs are clear without rales, wheezes, or rhonchi. Chest is moderately tender posterior laterally and laterally bilaterally.Marland Kitchen Heart has regular rate and rhythm without murmur. Abdomen is soft, flat, nontender without masses or hepatosplenomegaly and peristalsis is normoactive. Extremities have no cyanosis or edema, full range of motion is present. Skin is warm and dry without rash. Neurologic: Mental status is normal, cranial nerves are intact, there are no motor or sensory deficits.  ED Treatments / Results   Radiology Dg Ribs Bilateral  Result Date: 02/24/2018 CLINICAL DATA:  Pain after cough. EXAM: BILATERAL RIBS - 3+ VIEW COMPARISON:  Cervical spine and lumbar spine radiographs from 09/15/2015 and 01/01/2016 respectively. CXR from 11/20/2016. FINDINGS:  Multiple views of the ribs demonstrate no acute displaced fracture or suspicious osseous lesions. No adjacent pulmonary consolidation, effusion or pneumothorax is noted. The patient is status post lower cervical ACDF from C5 through C7 as well as lower lumbar spinal fusion at L4-5. IMPRESSION: No acute fracture of the ribs.  No suspicious osseous lesions. Electronically Signed   By: Tollie Eth M.D.   On: 02/24/2018 03:31    Procedures Procedures  Medications Ordered in ED Medications  naproxen (NAPROSYN) tablet 500 mg (500 mg Oral Given 02/24/18 0306)  cyclobenzaprine (FLEXERIL) tablet 10 mg (10 mg Oral Given 02/24/18 0306)  oxyCODONE-acetaminophen (PERCOCET/ROXICET) 5-325 MG per tablet 1 tablet (1 tablet Oral Given 02/24/18 0307)     Initial Impression / Assessment and Plan / ED Course  I have reviewed the triage vital signs and the nursing notes.  Pertinent imaging results that were available during my care of the patient were reviewed by me and considered in my medical decision making (see chart for details).  Mid back pain which appears to be muscular.  No red flags to suggest more serious causes of pain.  Although she is allergic to numerous NSAIDs, she states she cannot take naproxen.  She is given a dose of naproxen as well as cyclobenzaprine and oxycodone-acetaminophen.  She is being sent for x-rays of bilateral ribs, which will also visualize the thoracic spine.  Old records are reviewed confirming prior ED visits for back pain, and surgery on cervical spine and lumbar spine.  X-rays showed no acute process, no fracture.  She had moderate relief of pain with above-noted treatment.  She is discharged with prescriptions for naproxen, cyclobenzaprine, and oxycodone-acetaminophen.  Advised to apply ice.  Follow-up with PCP.  Return precautions discussed.  Final Clinical Impressions(s) / ED Diagnoses   Final diagnoses:  Chest pain on breathing  Thoracic myofascial strain, initial encounter      ED Discharge Orders        Ordered    cyclobenzaprine (FLEXERIL) 10 MG tablet  3 times daily PRN     02/24/18 0440    naproxen (NAPROSYN) 500 MG tablet  2 times daily  02/24/18 0440    oxyCODONE-acetaminophen (PERCOCET) 5-325 MG tablet  Every 4 hours PRN     02/24/18 0440       Dione BoozeGlick, Melida Northington, MD 02/24/18 [phone]

## 2018-02-24 NOTE — ED Notes (Addendum)
Patient back from CT.

## 2018-02-24 NOTE — Discharge Instructions (Addendum)
Apply ice for 20-1030minutes, 3-4 times a day.  Return if symptoms are getting worse.  Otherwise, follow-up with your primary care provider.

## 2020-02-16 ENCOUNTER — Other Ambulatory Visit: Payer: Self-pay

## 2020-02-16 ENCOUNTER — Emergency Department (HOSPITAL_COMMUNITY)
Admission: EM | Admit: 2020-02-16 | Discharge: 2020-02-17 | Disposition: A | Discharge status: Home / Self Care | Payer: Medicaid Other | Attending: Emergency Medicine | Admitting: Emergency Medicine

## 2020-02-16 ENCOUNTER — Encounter (HOSPITAL_COMMUNITY): Payer: Self-pay | Admitting: Pediatrics

## 2020-02-16 DIAGNOSIS — J45909 Unspecified asthma, uncomplicated: Secondary | ICD-10-CM | POA: Insufficient documentation

## 2020-02-16 DIAGNOSIS — R531 Weakness: Secondary | ICD-10-CM | POA: Insufficient documentation

## 2020-02-16 DIAGNOSIS — Z79899 Other long term (current) drug therapy: Secondary | ICD-10-CM | POA: Diagnosis not present

## 2020-02-16 DIAGNOSIS — F17210 Nicotine dependence, cigarettes, uncomplicated: Secondary | ICD-10-CM | POA: Insufficient documentation

## 2020-02-16 DIAGNOSIS — M5416 Radiculopathy, lumbar region: Secondary | ICD-10-CM

## 2020-02-16 DIAGNOSIS — M545 Low back pain: Secondary | ICD-10-CM | POA: Diagnosis present

## 2020-02-16 DIAGNOSIS — J449 Chronic obstructive pulmonary disease, unspecified: Secondary | ICD-10-CM | POA: Diagnosis not present

## 2020-02-16 DIAGNOSIS — R202 Paresthesia of skin: Secondary | ICD-10-CM | POA: Diagnosis not present

## 2020-02-16 DIAGNOSIS — I10 Essential (primary) hypertension: Secondary | ICD-10-CM | POA: Insufficient documentation

## 2020-02-16 DIAGNOSIS — Z20822 Contact with and (suspected) exposure to covid-19: Secondary | ICD-10-CM | POA: Insufficient documentation

## 2020-02-16 NOTE — ED Triage Notes (Signed)
Patient arrived via POV. Stated was seen yesterday by PCP yesterday for low back pain radiating to right leg. Stated she is not having pain currently but can't lift leg, and it feels like its about to burst from the inside.

## 2020-02-17 ENCOUNTER — Telehealth (HOSPITAL_COMMUNITY): Payer: Self-pay | Admitting: Student

## 2020-02-17 ENCOUNTER — Emergency Department (HOSPITAL_COMMUNITY): Payer: Medicaid Other

## 2020-02-17 LAB — CBC WITH DIFFERENTIAL/PLATELET
Abs Immature Granulocytes: 0.06 10*3/uL (ref 0.00–0.07)
Basophils Absolute: 0 10*3/uL (ref 0.0–0.1)
Basophils Relative: 0 %
Eosinophils Absolute: 0.4 10*3/uL (ref 0.0–0.5)
Eosinophils Relative: 3 %
HCT: 44.4 % (ref 36.0–46.0)
Hemoglobin: 14.5 g/dL (ref 12.0–15.0)
Immature Granulocytes: 1 %
Lymphocytes Relative: 19 %
Lymphs Abs: 2.4 10*3/uL (ref 0.7–4.0)
MCH: 32.3 pg (ref 26.0–34.0)
MCHC: 32.7 g/dL (ref 30.0–36.0)
MCV: 98.9 fL (ref 80.0–100.0)
Monocytes Absolute: 0.7 10*3/uL (ref 0.1–1.0)
Monocytes Relative: 6 %
Neutro Abs: 8.9 10*3/uL — ABNORMAL HIGH (ref 1.7–7.7)
Neutrophils Relative %: 71 %
Platelets: 207 10*3/uL (ref 150–400)
RBC: 4.49 MIL/uL (ref 3.87–5.11)
RDW: 13.6 % (ref 11.5–15.5)
WBC: 12.6 10*3/uL — ABNORMAL HIGH (ref 4.0–10.5)
nRBC: 0 % (ref 0.0–0.2)

## 2020-02-17 LAB — COMPREHENSIVE METABOLIC PANEL
ALT: 36 U/L (ref 0–44)
AST: 28 U/L (ref 15–41)
Albumin: 3.2 g/dL — ABNORMAL LOW (ref 3.5–5.0)
Alkaline Phosphatase: 95 U/L (ref 38–126)
Anion gap: 10 (ref 5–15)
BUN: 11 mg/dL (ref 6–20)
CO2: 22 mmol/L (ref 22–32)
Calcium: 8.8 mg/dL — ABNORMAL LOW (ref 8.9–10.3)
Chloride: 105 mmol/L (ref 98–111)
Creatinine, Ser: 0.86 mg/dL (ref 0.44–1.00)
GFR calc Af Amer: >60 mL/min (ref >60)
GFR calc non Af Amer: >60 mL/min (ref >60)
Glucose, Bld: 128 mg/dL — ABNORMAL HIGH (ref 70–99)
Potassium: 3.6 mmol/L (ref 3.5–5.1)
Sodium: 137 mmol/L (ref 135–145)
Total Bilirubin: 0.7 mg/dL (ref 0.3–1.2)
Total Protein: 7 g/dL (ref 6.5–8.1)

## 2020-02-17 LAB — SARS CORONAVIRUS 2 BY RT PCR (HOSPITAL ORDER, PERFORMED IN ~~LOC~~ HOSPITAL LAB): SARS Coronavirus 2: NEGATIVE

## 2020-02-17 IMAGING — MR MR LUMBAR SPINE W/O CM
4 of 5 series · 26 of 48 positions shown · non-contrast
Comparison: Lumbar MRI [DATE]

CLINICAL DATA: Low back pain with progressive neurologic deficit

EXAM:
MRI LUMBAR SPINE WITHOUT CONTRAST
TECHNIQUE: Multiplanar, multisequence MR imaging of the lumbar spine was
performed. No intravenous contrast was administered.

[Series 5: T2 · sagittal · 4.0mm · 0.73mm/px · 6 of 17 slices shown (1 of 2)]
[im 1/17]
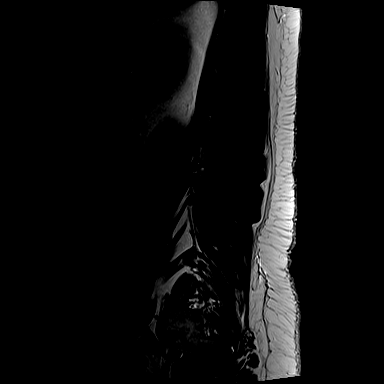
[im 4/17]
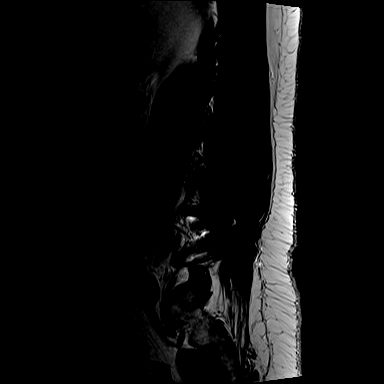
[im 7/17]
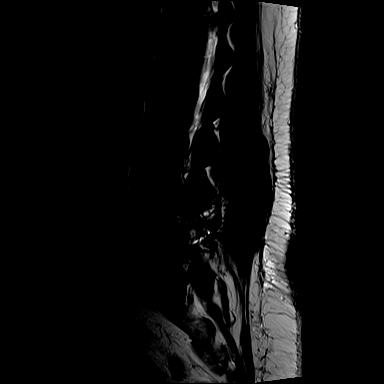
[im 10/17]
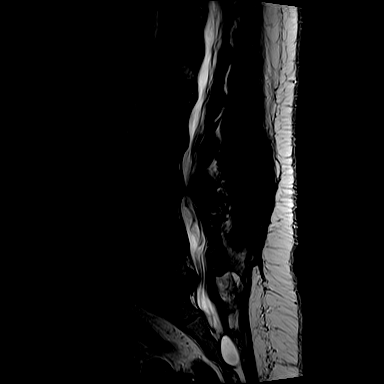
[im 13/17]
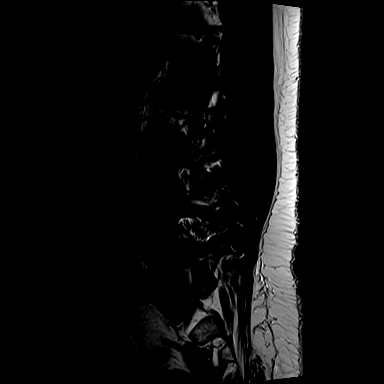
[im 17/17]
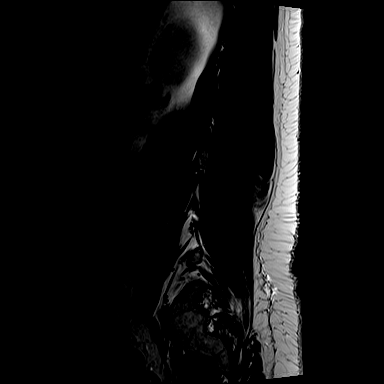

[Series 7: T1 · sagittal · 4.0mm · 0.88mm/px · 6 of 17 slices shown (1 of 2)]
[im 1/17]
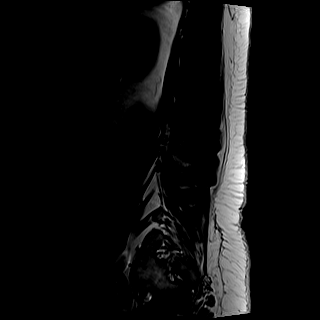
[im 4/17]
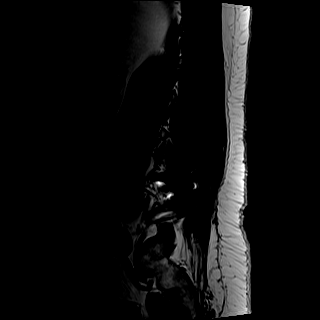
[im 7/17]
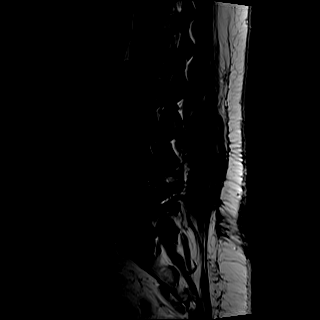
[im 10/17]
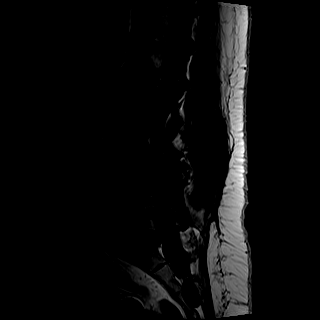
[im 13/17]
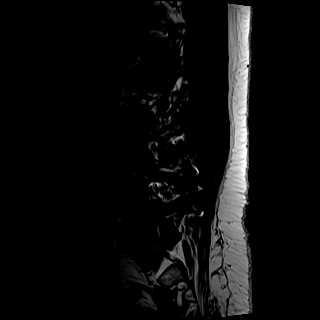
[im 17/17]
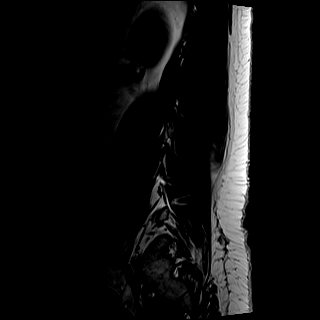

[Series 8: T2 · axial · 4.0mm · 0.57mm/px · z∈[-150,+95]mm · 9 of 43 slices shown (2 of 2)]
[im 1/43]
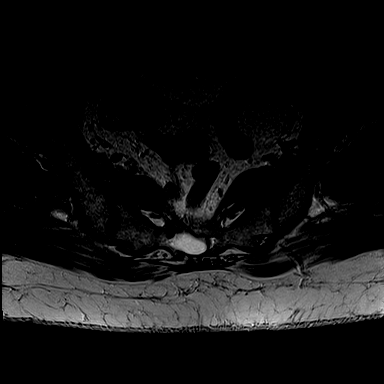
[im 7/43]
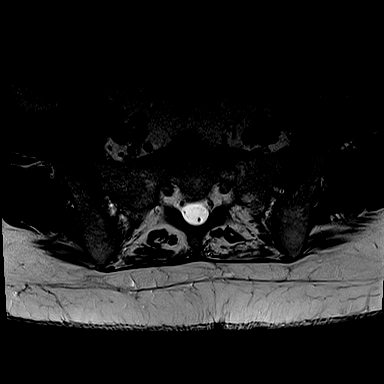
[im 13/43]
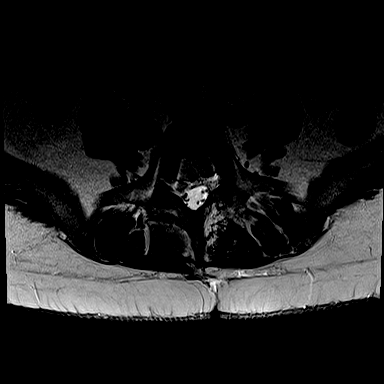
[im 19/43]
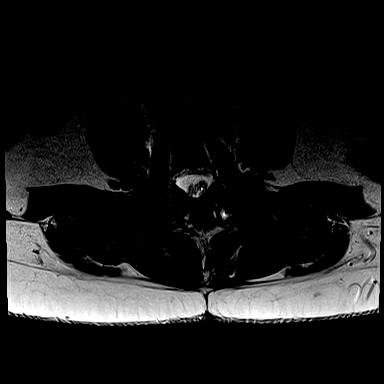
[im 22/43]
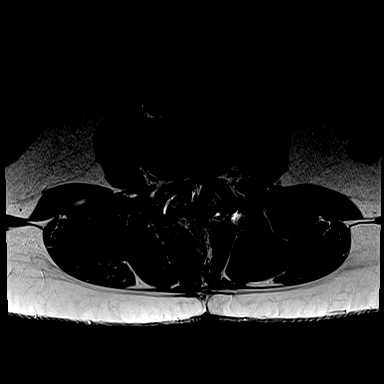
[im 25/43]
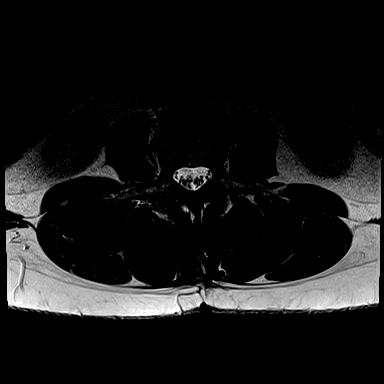
[im 31/43]
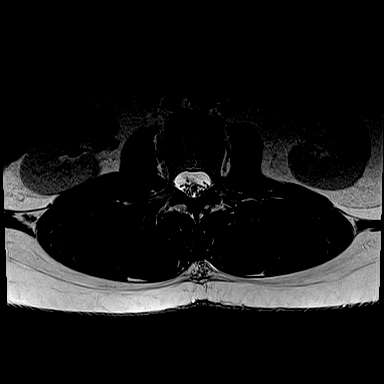
[im 37/43]
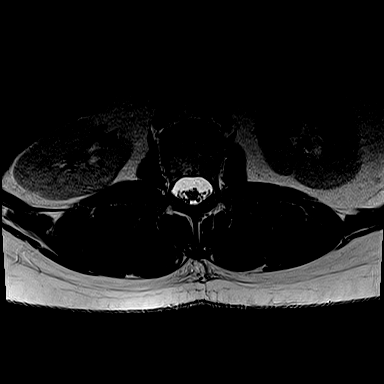
[im 43/43]
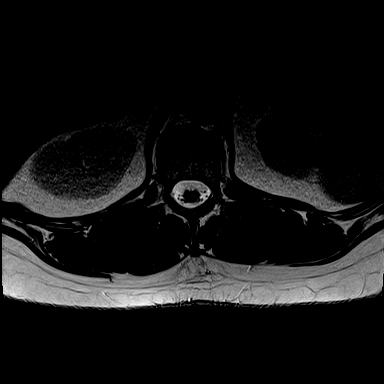

[Series 9: T1 · axial · 4.0mm · 0.34mm/px · z∈[-150,+65]mm · 5 of 43 slices shown (2 of 2)]
[im 1/43]
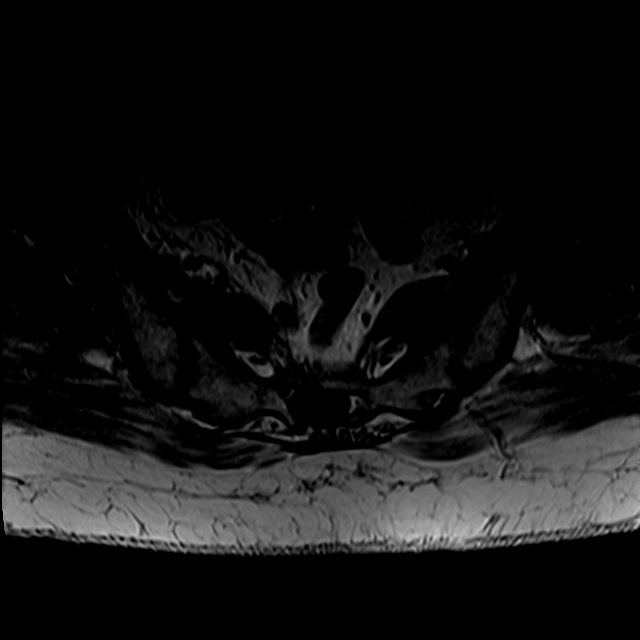
[im 7/43]
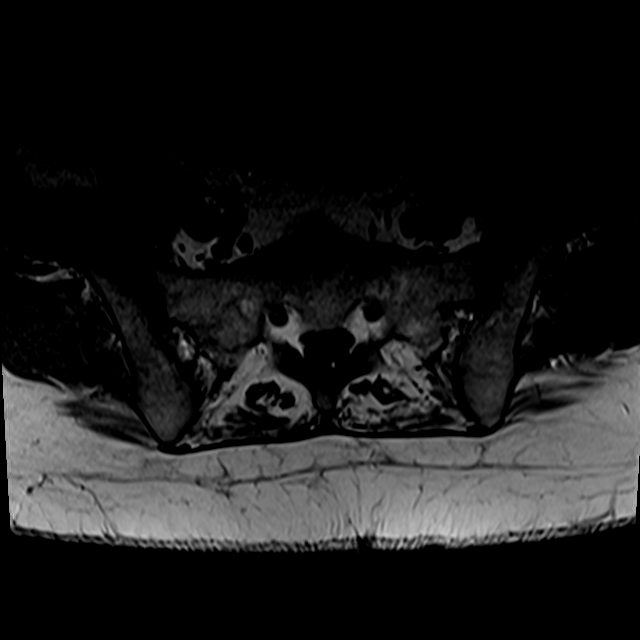
[im 13/43]
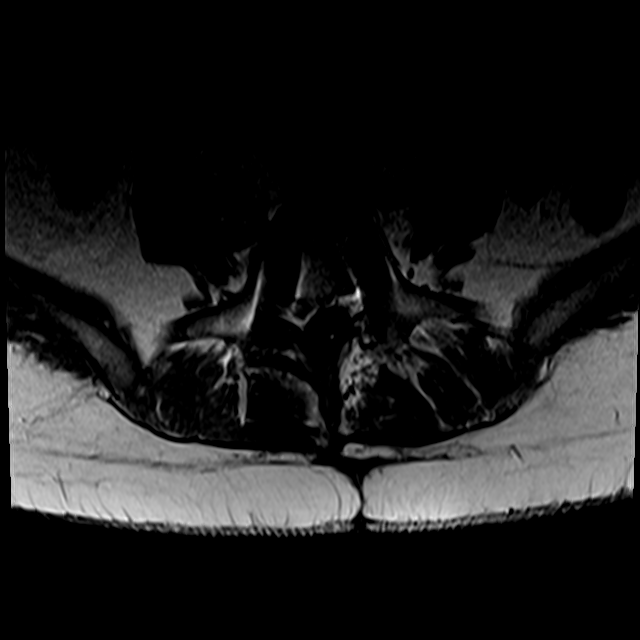
[im 22/43]
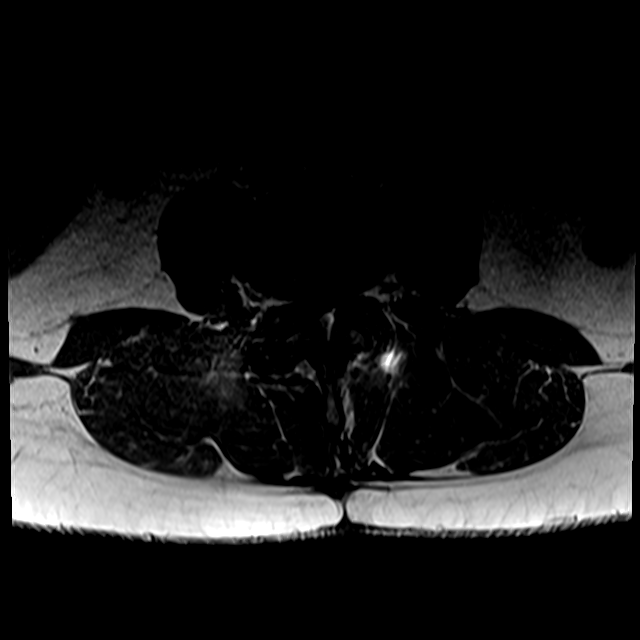
[im 37/43]
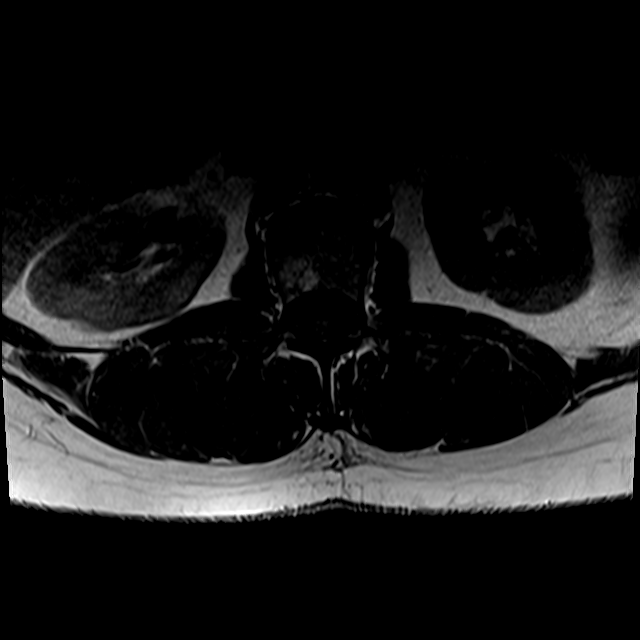

[26 of 48 positions shown; findings below may reference images not displayed]

FINDINGS: Segmentation:  Standard lumbar numbering

Alignment:  Fused anterolisthesis at L4-5, improved from comparison

Vertebrae: L4-5 PLIF with solid arthrodesis. No fracture, discitis,
or aggressive bone lesion.

Conus medullaris and cauda equina: Conus extends to the L2 level.
Conus and cauda equina appear normal. Fatty filum terminalis, also
seen previously.

Paraspinal and other soft tissues: Expected postoperative scarring.

Disc levels:

T12- L1: Narrow disc.

L1-L2: Narrow disc.

L2-L3: Disc narrowing and mild bulging.

L3-L4: Disc narrowing and bulging. Posterior element hypertrophy
with prominent ligamentum flavum thickening and synovial
cysts/degenerative ganglia on both sides and in the midline. Spinal
stenosis is advanced, with cauda equina compression on sagittal
images. Left more than right foraminal narrowing due to left-sided
synovial cyst.

L4-L5: PLIF.  No visible impingement

L5-S1:Central disc extrusion impinging on both descending S1 nerve
roots. The foramina are patent.
IMPRESSION: 1. L5-S1 disc extrusion impinging on both descending S1 nerve roots.
2. L3-4 advanced degenerative spinal stenosis and asymmetric left
foraminal impingement due to synovial cyst.
3. L4-5 PLIF with solid arthrodesis.

## 2020-02-17 MED ORDER — OXYCODONE-ACETAMINOPHEN 5-325 MG PO TABS
1.0000 | ORAL_TABLET | Freq: Once | ORAL | Status: AC
  Administered 2020-02-17: 1 via ORAL
  Filled 2020-02-17: qty 1

## 2020-02-17 MED ORDER — METHOCARBAMOL 500 MG PO TABS: 1000.0000 mg | ORAL_TABLET | Freq: Two times a day (BID) | ORAL | 0 refills | Status: DC

## 2020-02-17 MED ORDER — PREDNISONE 10 MG PO TABS: ORAL_TABLET | ORAL | 0 refills | Status: DC

## 2020-02-17 MED ORDER — LORAZEPAM 2 MG/ML IJ SOLN
1.0000 mg | Freq: Once | INTRAMUSCULAR | Status: AC
  Administered 2020-02-17: 1 mg via INTRAVENOUS
  Filled 2020-02-17: qty 1

## 2020-02-17 MED ORDER — PREDNISONE 10 MG PO TABS
ORAL_TABLET | ORAL | 0 refills | Status: DC
Start: 2020-02-17 — End: 2020-02-17

## 2020-02-17 MED ORDER — ONDANSETRON 4 MG PO TBDP
4.0000 mg | ORAL_TABLET | Freq: Once | ORAL | Status: AC
  Administered 2020-02-17: 4 mg via ORAL
  Filled 2020-02-17: qty 1

## 2020-02-17 MED ORDER — METHOCARBAMOL 500 MG PO TABS
1000.0000 mg | ORAL_TABLET | Freq: Two times a day (BID) | ORAL | 0 refills | Status: DC
Start: 2020-02-17 — End: 2020-02-17

## 2020-02-17 MED ORDER — MAGIC MOUTHWASH: 5.0000 mL | Freq: Two times a day (BID) | ORAL | 0 refills | Status: DC | PRN

## 2020-02-17 NOTE — ED Provider Notes (Signed)
[facility] EMERGENCY DEPARTMENT Provider Note   CSN: [id number] Arrival date & time: 02/16/20  1625     History Chief Complaint  Patient presents with  . Extremity Weakness    Dana Holland is a 59 y.o. female.  Patient to ED with complaint of acute on chronic low back pain with new symptoms right leg weakness and decreased sensation x 2-3 days. No falls. No urinary/bowel incontinence. She notes she has been unable to dorsiflex the right foot but can plantar flex. Her numbness starts at the lateral thigh and goes to the foot, affecting all toes. History of previous lumbar surgery in 2017 [facility]) for herniated disc. No abdominal pain, fever, nausea or urinary symptoms.   The history is provided by the patient. No language interpreter was used.  Extremity Weakness Pertinent negatives include no abdominal pain.       Past Medical History:  Diagnosis Date  . Anxiety   . Asthma   . Bipolar disorder (HCC)    takes Lamictal daily  . Chronic back pain   . Chronic back pain   . COPD (chronic obstructive pulmonary disease) (HCC)   . DDD (degenerative disc disease)   . DDD (degenerative disc disease), lumbar   . DDD (degenerative disc disease), lumbar   . Depression    takes Prozac daily  . DJD (degenerative joint disease)   . Fibromyalgia   . Headache    migraines  . History of kidney stones   . History of palpitations    "I was checked out by Dr. Antoine Poche and everything okay"  . Hypertension   . Insomnia    takes Ambien nightly  . Lumbar stenosis with neurogenic claudication   . Neck pain   . Seasonal allergies   . Shortness of breath dyspnea    with exertion    Patient Active Problem List   Diagnosis Date Noted  . Bipolar disease, chronic (HCC) 05/21/2016  . Elevated blood-pressure reading without diagnosis of hypertension 05/21/2016  . Lumbar stenosis with neurogenic claudication 01/01/2016  . HNP (herniated nucleus pulposus), cervical  08/03/2015    Past Surgical History:  Procedure Laterality Date  . ABDOMINAL EXPLORATION SURGERY    . ABDOMINAL HYSTERECTOMY    . ANTERIOR CERVICAL DECOMP/DISCECTOMY FUSION N/A 08/03/2015   Procedure: ACDF C5-C6  C6-C7;  Surgeon: Shirlean Kelly, MD;  Location: [facility];  Service: Neurosurgery;  Laterality: N/A;  ACDF C5-C6  C6-C7  . BACK SURGERY     lower back  . BREAST SURGERY    . CARPAL TUNNEL RELEASE Right 11/29/2015   Procedure: CARPAL TUNNEL RELEASE-Right;  Surgeon: Shirlean Kelly, MD;  Location: [facility];  Service: Neurosurgery;  Laterality: Right;  CARPAL TUNNEL RELEASE-Right  . CARPAL TUNNEL RELEASE Left 05/23/2016   Procedure: Left CARPAL TUNNEL RELEASE with Bier block;  Surgeon: Shirlean Kelly, MD;  [facility];  Service: Neurosurgery;  Laterality: Left;  Left CARPAL TUNNEL RELEASE with Bier block  . CHOLECYSTECTOMY    . DILATION AND CURETTAGE OF UTERUS    . KNEE SURGERY    . MULTIPLE TOOTH EXTRACTIONS    . TUBAL LIGATION       OB History   No obstetric history on file.     Family History  Problem Relation Age of Onset  . Heart disease Mother   . Arthritis Mother   . Hyperlipidemia Mother   . Hypertension Father   . Heart disease Father   . Diabetes Father   .  Hyperlipidemia Father   . Hypertension Other   . Heart disease Other   . Arthritis Sister   . COPD Sister   . Depression Sister   . Hyperlipidemia Sister   . Hyperlipidemia Brother   . Hypertension Brother   . Asthma Son   . Arthritis Maternal Grandmother   . Diabetes Maternal Grandmother   . Heart disease Maternal Grandmother   . Heart disease Maternal Grandfather   . Arthritis Paternal Grandmother   . Heart disease Paternal Grandmother   . Heart disease Paternal Grandfather     Social History   Tobacco Use  . Smoking status: Current Every Day Smoker    Packs/day: 0.25    Years: 40.00    Pack years: 10.00    Types: Cigarettes, E-cigarettes    Last attempt to quit: 04/30/2015      Years since quitting: 4.8  . Smokeless tobacco: Never Used  . Tobacco comment: currently uses Vapor 12/25/15, 5 cigarettes per day currently 04/03/16  Substance Use Topics  . Alcohol use: No  . Drug use: No    Home Medications Prior to Admission medications   Medication Sig Start Date End Date Taking? Authorizing Provider  aspirin 325 MG EC tablet Take 325 mg by mouth daily as needed for pain.    [provider]  cyclobenzaprine (FLEXERIL) 10 MG tablet Take 1 tablet (10 mg total) by mouth 3 (three) times daily as needed for muscle spasms. 02/24/18   Dione Booze, MD  diphenhydrAMINE (BENADRYL) 25 MG tablet Take 25 mg by mouth every 6 (six) hours as needed for allergies.    [provider]  FLUoxetine (PROZAC) 40 MG capsule Take 40 mg by mouth daily.    [provider]  fluticasone (FLONASE) 50 MCG/ACT nasal spray PLACE 1 SPRAY INTO BOTH NOSTRILS DAILY AS NEEDED FOR ALLERGIES. 04/16/17   Dettinger, Elige Radon, MD  gabapentin (NEURONTIN) 600 MG tablet Take 600-1,200 mg by mouth 3 (three) times daily. 600 mg twice a day and 1200 mg at bedtime 11/17/15   [provider]  HYDROcodone-acetaminophen (NORCO) 10-325 MG tablet Take 1 tablet by mouth every 4 (four) hours as needed for moderate pain.     [provider]  lisinopril (PRINIVIL,ZESTRIL) 10 MG tablet TAKE 1 TABLET (10 MG TOTAL) BY MOUTH DAILY. 03/25/17   Elenora Gamma, MD  naproxen (NAPROSYN) 500 MG tablet Take 1 tablet (500 mg total) by mouth 2 (two) times daily. 02/24/18   Dione Booze, MD  omega-3 acid ethyl esters (LOVAZA) 1 g capsule Take 1 g by mouth daily.     [provider]  oxyCODONE-acetaminophen (PERCOCET) 5-325 MG tablet Take 1 tablet by mouth every 4 (four) hours as needed for moderate pain. 02/24/18   Dione Booze, MD  PROVENTIL HFA 108 [phone] Base) MCG/ACT inhaler INHALE 2 PUFFS INTO THE LUNGS EVERY 6 (SIX) HOURS AS NEEDED FOR WHEEZING OR SHORTNESS OF BREATH. 11/11/16   Mechele Claude, MD  zolpidem (AMBIEN CR) 6.25 MG CR tablet Take 6.25 mg by mouth at bedtime as needed for sleep.    [provider]    Allergies    Imitrex [sumatriptan], Remeron [mirtazapine], Wellbutrin [bupropion], Ibuprofen, Keflex [cephalexin], Nsaids, Paroxetine hcl, Toradol [ketorolac tromethamine], Vioxx [rofecoxib], Bextra [valdecoxib], Buspar [buspirone], Compazine [prochlorperazine edisylate], Cymbalta [duloxetine hcl], Effexor [venlafaxine], Sulfa antibiotics, Voltaren [diclofenac sodium], and Tramadol  Review of Systems   Review of Systems  Constitutional: Negative for chills and fever.  HENT: Negative.   Respiratory: Negative.  Cardiovascular: Negative.   Gastrointestinal: Negative.  Negative for abdominal pain and nausea.  Musculoskeletal: Positive for back pain and extremity weakness.  Skin: Negative.   Neurological: Positive for weakness and numbness.    Physical Exam Updated Vital Signs BP (!) 131/81 (BP Location: Left Arm)   Pulse 86   Temp 98.2 F (36.8 C) (Oral)   Resp 16   Ht  (1.626 m)   Wt (!) 98.9 kg   SpO2 91%   BMI 37.42 kg/m   Physical Exam Vitals and nursing note reviewed.  Constitutional:      General: She is not in acute distress.    Appearance: She is well-developed. She is obese.  HENT:     Head: Normocephalic.  Cardiovascular:     Rate and Rhythm: Normal rate and regular rhythm.  Pulmonary:     Effort: Pulmonary effort is normal.     Breath sounds: Normal breath sounds.  Abdominal:     General: Bowel sounds are normal.     Palpations: Abdomen is soft.     Tenderness: There is no abdominal tenderness. There is no guarding or rebound.  Musculoskeletal:        General: Normal range of motion.     Cervical back: Normal range of motion and neck supple.  Skin:    General: Skin is warm and dry.     Findings: No rash.  Neurological:     Mental Status: She is alert and oriented to person, place, and time.     Comments: Sensory  deficit right LE, lateral > medial, and lateral foot. There is weakness of dorsiflexion without weakness on plantar flexion. Straight leg raise negative. Reflexes equal in bilateral LE's.      ED Results / Procedures / Treatments   Labs (all labs ordered are listed, but only abnormal results are displayed) Labs Reviewed  CBC WITH DIFFERENTIAL/PLATELET  COMPREHENSIVE METABOLIC PANEL    EKG None  Radiology No results found.  Procedures Procedures (including critical care time)  Medications Ordered in ED Medications  ondansetron (ZOFRAN-ODT) disintegrating tablet 4 mg (has no administration in time range)  oxyCODONE-acetaminophen (PERCOCET/ROXICET) 5-325 MG per tablet 1 tablet (has no administration in time range)  LORazepam (ATIVAN) injection 1 mg (has no administration in time range)    ED Course  I have reviewed the triage vital signs and the nursing notes.  Pertinent labs & imaging results that were available during my care of the patient were reviewed by me and considered in my medical decision making (see chart for details).    MDM Rules/Calculators/A&P                          Patient to ED with acute on chronic back pain with new weakness and decreased sensation in the right leg.   She has dorsiflexon weakness of the right foot, decreased sensation. No incontinence. Pain addressed. MRI ordered for complete evaluation.   Patient reports she got relief more from the Ativan given for anxiety prior to MRI. She is resting comfortably on re-examination. She has increased but still weak right dorsiflexion.   MRI showing nerve root impingement without lumbar spinal cord involvement.  She is comfortable with discharge home. She is under a Pain Mgmt contract and has opioid pain relief at home. Will add Robaxin and a prednisone dosepack. Encourage f/u with her neurosurgeon to consider treatment options.  Final Clinical Impression(s) / ED Diagnoses Final diagnoses:  None  1.  Lumbar radiculopathy  Rx / DC Orders ED Discharge Orders    None       Danne Harbor 02/17/20 3299    Dione Booze, MD 02/17/20 [phone]

## 2020-02-17 NOTE — Telephone Encounter (Cosign Needed)
Patient was seen last night in the ED by previous provider.  Requesting pharmacy change.  Medications were changed to the requested pharmacy.  Additionally, patient was requesting magic mouthwash prescription.

## 2020-02-17 NOTE — Discharge Instructions (Signed)
Please follow up with neurosurgery for further evaluation and management of recurrent/worsening back pain. Take your regularly prescribed pain medication and the Robaxin and prednisone as instructed.   Return to the emergency department with any new or worsening symptoms.

## 2020-06-05 ENCOUNTER — Other Ambulatory Visit: Payer: Self-pay | Admitting: Neurological Surgery

## 2020-06-20 ENCOUNTER — Other Ambulatory Visit: Payer: Self-pay | Admitting: Neurological Surgery

## 2020-06-22 NOTE — Progress Notes (Signed)
[facility] [place], [place] - [address] [address] Phone: [phone] Fax: [phone]      Your procedure is scheduled on Tuesday, 06/27/2020.  Report to [facility] Main Entrance "A" at 05:30 A.M., and check in at the Admitting office.  Call this number if you have problems the morning of surgery:  [phone]  Call [phone] if you have any questions prior to your surgery date Monday-Friday 8am-4pm    Remember:  Do not eat or drink after midnight the night before your surgery   Take these medicines the morning of surgery with A SIP OF WATER: Gabapentin (Neurontin) Hydroxyzine (Vistaril) Methocarbamol (Robaxin) Oxycodone HCl  If needed you may take the following medications the morning of surgery: Diphenhydramine (Benadryl) Fluticasone (Flonase) nasal spray Proventil HFA inhaler - Please bring all inhalers with you the day of surgery.  Eye drops  As of today, STOP taking any Aspirin (unless otherwise instructed by your surgeon) Aleve, Naproxen, Ibuprofen, Motrin, Advil, Goody's, BC's, all herbal medications, fish oil, and all vitamins.                      Do not wear jewelry, make up, or nail polish            Do not wear lotions, powders, perfumes/colognes, or deodorant.            Do not shave 48 hours prior to surgery.  Men may shave face and neck.            Do not bring valuables to the hospital.            [facility] is not responsible for any belongings or valuables.  Do NOT Smoke (Tobacco/Vaping) or drink Alcohol 24 hours prior to your procedure  If you use a CPAP at night, you may bring all equipment for your overnight stay.   Contacts, glasses, dentures or bridgework may not be worn into surgery.      For patients admitted to the hospital, discharge time will be determined by your treatment team.   Patients discharged the day of surgery will not be allowed to drive home, and someone needs to stay with them for 24  hours.    Special instructions:   [place]- Preparing For Surgery  Before surgery, you can play an important role. Because skin is not sterile, your skin needs to be as free of germs as possible. You can reduce the number of germs on your skin by washing with CHG (chlorahexidine gluconate) Soap before surgery.  CHG is an antiseptic cleaner which kills germs and bonds with the skin to continue killing germs even after washing.    Oral Hygiene is also important to reduce your risk of infection.  Remember - BRUSH YOUR TEETH THE MORNING OF SURGERY WITH YOUR REGULAR TOOTHPASTE  Please do not use if you have an allergy to CHG or antibacterial soaps. If your skin becomes reddened/irritated stop using the CHG.  Do not shave (including legs and underarms) for at least 48 hours prior to first CHG shower. It is OK to shave your face.  Please follow these instructions carefully.   1. Shower the NIGHT BEFORE SURGERY and the MORNING OF SURGERY with CHG Soap.   2. If you chose to wash your hair, wash your hair first as usual with your normal shampoo.  3. After you shampoo, rinse your hair and body thoroughly to remove the shampoo.  4. Use CHG as you would any other liquid soap. You can apply CHG directly to the skin and wash gently with a scrungie or a clean washcloth.   5. Apply the CHG Soap to your body ONLY FROM THE NECK DOWN.  Do not use on open wounds or open sores. Avoid contact with your eyes, ears, mouth and genitals (private parts). Wash Face and genitals (private parts)  with your normal soap.   6. Wash thoroughly, paying special attention to the area where your surgery will be performed.  7. Thoroughly rinse your body with warm water from the neck down.  8. DO NOT shower/wash with your normal soap after using and rinsing off the CHG Soap.  9. Pat yourself dry with a CLEAN TOWEL.  10. Wear CLEAN PAJAMAS to bed the night before surgery  11. Place CLEAN SHEETS on your bed the night of  your first shower and DO NOT SLEEP WITH PETS.   Day of Surgery: Shower with CHG soap as directed Wear Clean/Comfortable clothing the morning of surgery Do not apply any deodorants/lotions.   Remember to brush your teeth WITH YOUR REGULAR TOOTHPASTE.   Please read over the following fact sheets that you were given.

## 2020-06-23 ENCOUNTER — Encounter (HOSPITAL_COMMUNITY): Payer: Self-pay

## 2020-06-23 ENCOUNTER — Other Ambulatory Visit: Payer: Self-pay

## 2020-06-23 ENCOUNTER — Other Ambulatory Visit (HOSPITAL_COMMUNITY)
Admission: RE | Admit: 2020-06-23 | Discharge: 2020-06-23 | Disposition: A | Discharge status: Home / Self Care | Payer: Medicaid Other | Source: Ambulatory Visit | Attending: Neurological Surgery | Admitting: Neurological Surgery

## 2020-06-23 ENCOUNTER — Encounter (HOSPITAL_COMMUNITY)
Admission: RE | Admit: 2020-06-23 | Discharge: 2020-06-23 | Disposition: A | Discharge status: Home / Self Care | Payer: Medicaid Other | Source: Ambulatory Visit | Attending: Neurological Surgery | Admitting: Neurological Surgery

## 2020-06-23 DIAGNOSIS — Z01812 Encounter for preprocedural laboratory examination: Secondary | ICD-10-CM | POA: Insufficient documentation

## 2020-06-23 DIAGNOSIS — Z20822 Contact with and (suspected) exposure to covid-19: Secondary | ICD-10-CM | POA: Insufficient documentation

## 2020-06-23 DIAGNOSIS — Z01818 Encounter for other preprocedural examination: Secondary | ICD-10-CM | POA: Insufficient documentation

## 2020-06-23 LAB — CBC
HCT: 46.8 % — ABNORMAL HIGH (ref 36.0–46.0)
Hemoglobin: 15.6 g/dL — ABNORMAL HIGH (ref 12.0–15.0)
MCH: 32.3 pg (ref 26.0–34.0)
MCHC: 33.3 g/dL (ref 30.0–36.0)
MCV: 96.9 fL (ref 80.0–100.0)
Platelets: 227 10*3/uL (ref 150–400)
RBC: 4.83 MIL/uL (ref 3.87–5.11)
RDW: 12.7 % (ref 11.5–15.5)
WBC: 12.5 10*3/uL — ABNORMAL HIGH (ref 4.0–10.5)
nRBC: 0 % (ref 0.0–0.2)

## 2020-06-23 LAB — BASIC METABOLIC PANEL
Anion gap: 10 (ref 5–15)
BUN: 7 mg/dL (ref 6–20)
CO2: 23 mmol/L (ref 22–32)
Calcium: 9 mg/dL (ref 8.9–10.3)
Chloride: 100 mmol/L (ref 98–111)
Creatinine, Ser: 0.91 mg/dL (ref 0.44–1.00)
GFR, Estimated: >60 mL/min (ref >60)
Glucose, Bld: 125 mg/dL — ABNORMAL HIGH (ref 70–99)
Potassium: 4 mmol/L (ref 3.5–5.1)
Sodium: 133 mmol/L — ABNORMAL LOW (ref 135–145)

## 2020-06-23 LAB — SURGICAL PCR SCREEN
MRSA, PCR: NEGATIVE
Staphylococcus aureus: NEGATIVE

## 2020-06-23 LAB — SARS CORONAVIRUS 2 (TAT 6-24 HRS): SARS Coronavirus 2: NEGATIVE

## 2020-06-23 NOTE — Progress Notes (Signed)
PCP - Lauretta Grill, FNP at [facility] in [place] [place] Cardiologist - denies (saw dr. Juanetta Gosling since she has a strong family hx of cardiac issues  about 20 years ago but was cleared and hasnt seen him since.)  PPM/ICD - denies   Chest x-ray - n/a EKG - 06/23/2020 Stress Test - 20 years ago but pt states it was normal and Dr. Kari Baars cleared her afterwards.  ECHO - 20 years ago but pt states it was normal and Dr. Kari Baars cleared her afterwards.  Cardiac Cath - denies  Sleep Study - denies   Patient instructed to hold all Aspirin, NSAID's, herbal medications, fish oil and vitamins 7 days prior to surgery. Pt informed me she had stopped taking aspirin the middle of November.   ERAS Protcol -no   COVID TEST- 06/23/2020   Anesthesia review: no  Patient denies shortness of breath, fever, cough and chest pain at PAT appointment   All instructions explained to the patient, with a verbal understanding of the material. Patient agrees to go over the instructions while at home for a better understanding. Patient also instructed to self quarantine after being tested for COVID-19. The opportunity to ask questions was provided.

## 2020-06-26 MED ORDER — VANCOMYCIN HCL 1500 MG/300ML IV SOLN
1500.0000 mg | INTRAVENOUS | Status: DC
  Filled 2020-06-26: qty 300

## 2020-06-26 NOTE — Anesthesia Preprocedure Evaluation (Addendum)
Anesthesia Evaluation  Patient identified by MRN, date of birth, ID band Patient awake    Reviewed: Allergy & Precautions, NPO status , Patient's Chart, lab work & pertinent test results  Airway Mallampati: II  TM Distance: >3 FB     Dental   Pulmonary shortness of breath, asthma , COPD, Current Smoker,    breath sounds clear to auscultation       Cardiovascular hypertension,  Rhythm:Regular Rate:Normal     Neuro/Psych  Headaches,  Neuromuscular disease    GI/Hepatic negative GI ROS, Neg liver ROS,   Endo/Other  negative endocrine ROS  Renal/GU negative Renal ROS     Musculoskeletal  (+) Arthritis , Fibromyalgia -  Abdominal   Peds  Hematology   Anesthesia Other Findings   Reproductive/Obstetrics                            Anesthesia Physical Anesthesia Plan  ASA: III  Anesthesia Plan: General   Post-op Pain Management:    Induction: Intravenous  PONV Risk Score and Plan: 2  Airway Management Planned: Oral ETT  Additional Equipment:   Intra-op Plan:   Post-operative Plan: Possible Post-op intubation/ventilation  Informed Consent: I have reviewed the patients History and Physical, chart, labs and discussed the procedure including the risks, benefits and alternatives for the proposed anesthesia with the patient or authorized representative who has indicated his/her understanding and acceptance.     Dental advisory given  Plan Discussed with: Anesthesiologist and CRNA  Anesthesia Plan Comments:        Anesthesia Quick Evaluation

## 2020-06-27 ENCOUNTER — Ambulatory Visit (HOSPITAL_COMMUNITY): Payer: Medicaid Other | Admitting: Certified Registered Nurse Anesthetist

## 2020-06-27 ENCOUNTER — Encounter (HOSPITAL_COMMUNITY): Payer: Self-pay | Admitting: Neurological Surgery

## 2020-06-27 ENCOUNTER — Ambulatory Visit (HOSPITAL_COMMUNITY)
Admission: RE | Admit: 2020-06-27 | Discharge: 2020-06-27 | Disposition: A | Discharge status: Home / Self Care | Payer: Medicaid Other | Attending: Neurological Surgery | Admitting: Neurological Surgery

## 2020-06-27 ENCOUNTER — Other Ambulatory Visit (HOSPITAL_COMMUNITY): Payer: Self-pay | Admitting: Neurological Surgery

## 2020-06-27 ENCOUNTER — Ambulatory Visit (HOSPITAL_COMMUNITY): Payer: Medicaid Other

## 2020-06-27 ENCOUNTER — Other Ambulatory Visit: Payer: Self-pay

## 2020-06-27 ENCOUNTER — Encounter (HOSPITAL_COMMUNITY)
Admission: RE | Disposition: A | Discharge status: Home / Self Care | Payer: Self-pay | Source: Home / Self Care | Attending: Neurological Surgery

## 2020-06-27 DIAGNOSIS — M5117 Intervertebral disc disorders with radiculopathy, lumbosacral region: Secondary | ICD-10-CM | POA: Insufficient documentation

## 2020-06-27 DIAGNOSIS — Z981 Arthrodesis status: Secondary | ICD-10-CM | POA: Insufficient documentation

## 2020-06-27 DIAGNOSIS — M48062 Spinal stenosis, lumbar region with neurogenic claudication: Secondary | ICD-10-CM | POA: Insufficient documentation

## 2020-06-27 DIAGNOSIS — Z7982 Long term (current) use of aspirin: Secondary | ICD-10-CM | POA: Insufficient documentation

## 2020-06-27 DIAGNOSIS — M7138 Other bursal cyst, other site: Secondary | ICD-10-CM | POA: Diagnosis not present

## 2020-06-27 DIAGNOSIS — F17210 Nicotine dependence, cigarettes, uncomplicated: Secondary | ICD-10-CM | POA: Diagnosis not present

## 2020-06-27 DIAGNOSIS — Z79899 Other long term (current) drug therapy: Secondary | ICD-10-CM | POA: Diagnosis not present

## 2020-06-27 DIAGNOSIS — Z419 Encounter for procedure for purposes other than remedying health state, unspecified: Secondary | ICD-10-CM

## 2020-06-27 HISTORY — PX: LUMBAR LAMINECTOMY/ DECOMPRESSION WITH MET-RX: SHX5959

## 2020-06-27 LAB — PROTIME-INR
INR: 1.1 (ref 0.8–1.2)
Prothrombin Time: 13.5 seconds (ref 11.4–15.2)

## 2020-06-27 IMAGING — RF DG LUMBAR SPINE 2-3V
1 series · 1 of 1 positions shown · non-contrast
Comparison: [DATE].

CLINICAL DATA: Lumbar surgery.

EXAM:
LUMBAR SPINE - 2-3 VIEW; DG C-ARM 1-60 MIN
Radiation exposure index 25.86 mGy.

[Series 1: run · 1 of 1 slices shown]
[im 1/1]
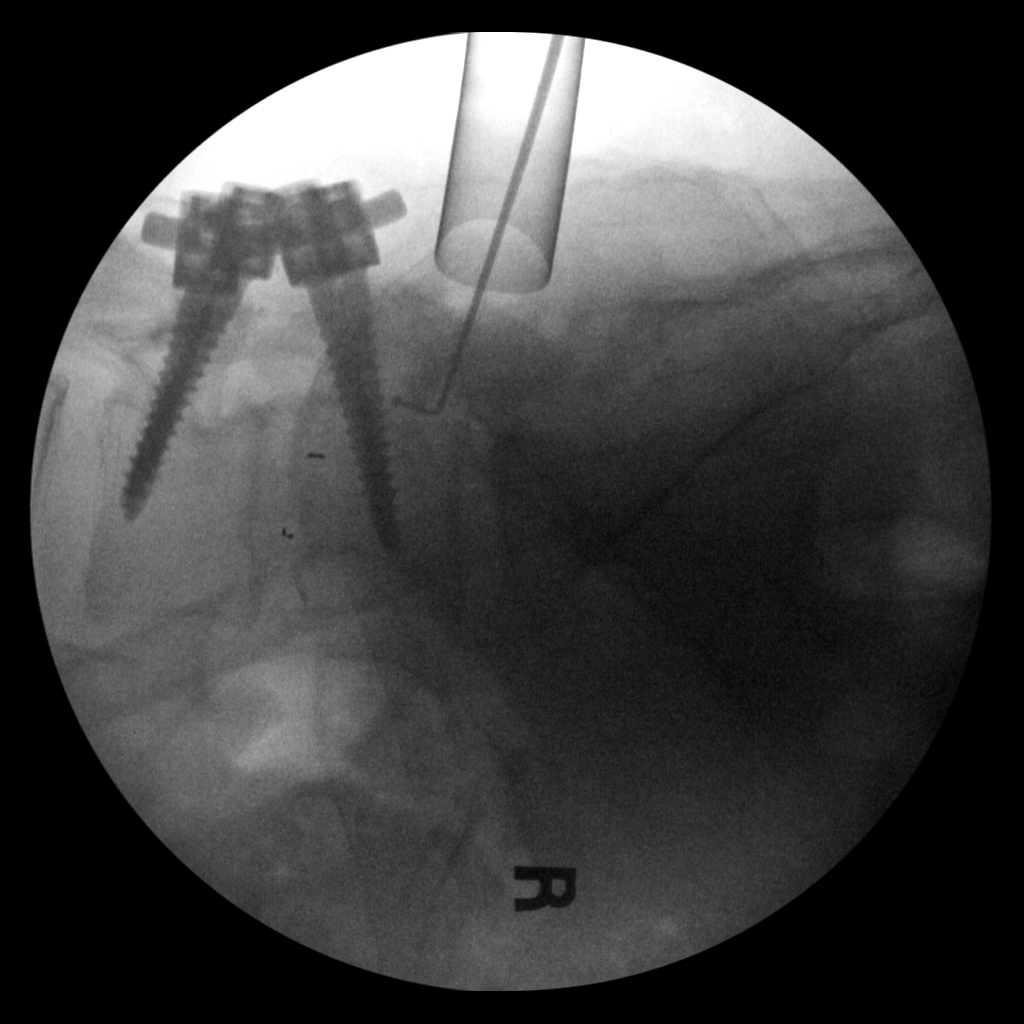

[1 of 1 positions shown; findings below may reference images not displayed]

FINDINGS: Single intraoperative fluoroscopic image was obtained of the lower
lumbar spine. Surgical probe is seen at approximately the L5-S1
level.
IMPRESSION: Fluoroscopic guidance provided during lumbar surgery.

## 2020-06-27 IMAGING — RF DG C-ARM 1-60 MIN
1 series · 1 of 1 positions shown · non-contrast
Comparison: [DATE].

CLINICAL DATA: Lumbar surgery.

EXAM:
LUMBAR SPINE - 2-3 VIEW; DG C-ARM 1-60 MIN
Radiation exposure index 25.86 mGy.

[Series 1: run · 1 of 1 slices shown]
[im 1/1]
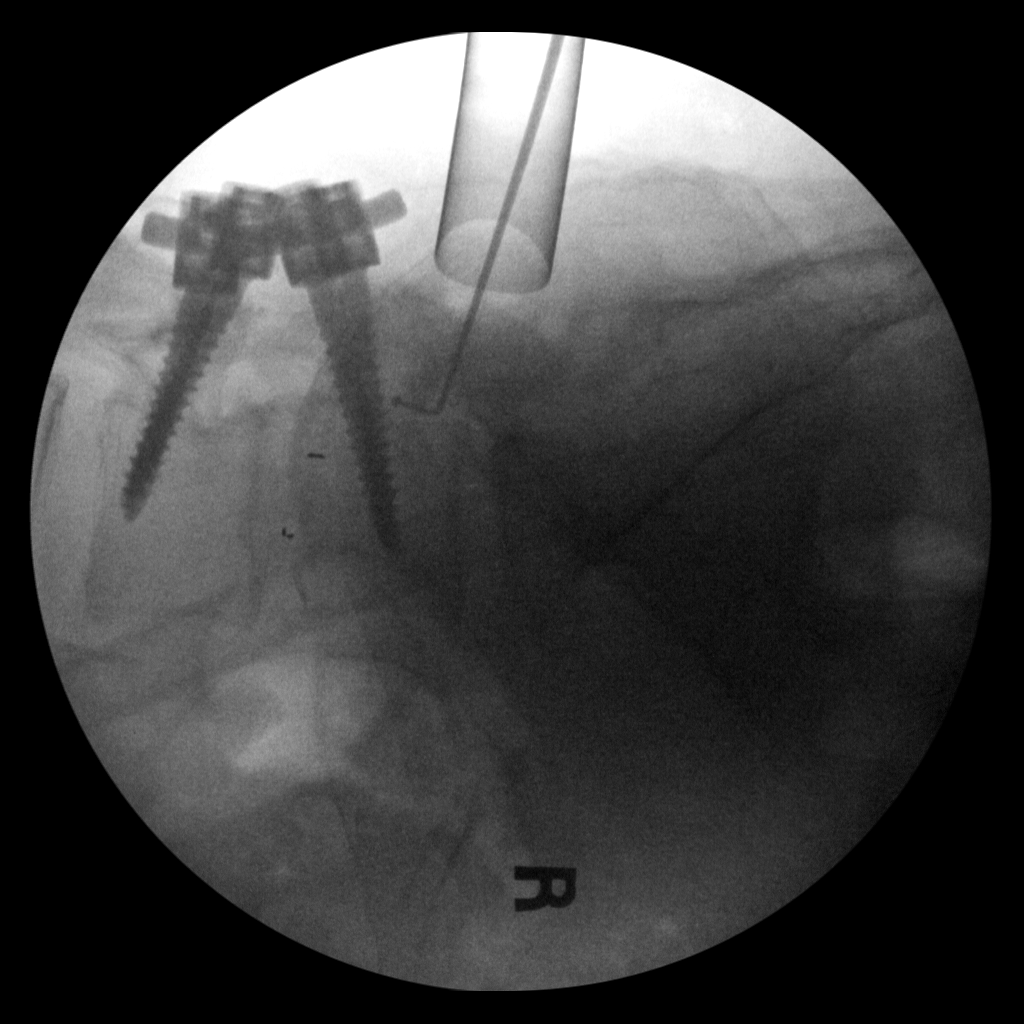

[1 of 1 positions shown; findings below may reference images not displayed]

FINDINGS: Single intraoperative fluoroscopic image was obtained of the lower
lumbar spine. Surgical probe is seen at approximately the L5-S1
level.
IMPRESSION: Fluoroscopic guidance provided during lumbar surgery.

## 2020-06-27 SURGERY — LUMBAR LAMINECTOMY/ DECOMPRESSION WITH MET-RX
Anesthesia: General

## 2020-06-27 MED ORDER — DEXAMETHASONE SODIUM PHOSPHATE 10 MG/ML IJ SOLN
INTRAMUSCULAR | Status: DC | PRN
  Administered 2020-06-27: 5 mg via INTRAVENOUS

## 2020-06-27 MED ORDER — ONDANSETRON HCL 4 MG/2ML IJ SOLN
INTRAMUSCULAR | Status: AC
  Filled 2020-06-27: qty 2

## 2020-06-27 MED ORDER — LIDOCAINE-EPINEPHRINE 1 %-1:100000 IJ SOLN
INTRAMUSCULAR | Status: DC | PRN
  Administered 2020-06-27: 15 mL

## 2020-06-27 MED ORDER — PHENYLEPHRINE HCL-NACL 10-0.9 MG/250ML-% IV SOLN
INTRAVENOUS | Status: DC | PRN
  Administered 2020-06-27: 40 ug/min via INTRAVENOUS

## 2020-06-27 MED ORDER — FENTANYL CITRATE (PF) 100 MCG/2ML IJ SOLN
25.0000 ug | INTRAMUSCULAR | Status: DC | PRN
  Administered 2020-06-27 (×3): 50 ug via INTRAVENOUS

## 2020-06-27 MED ORDER — CHLORHEXIDINE GLUCONATE CLOTH 2 % EX PADS: 6.0000 | MEDICATED_PAD | Freq: Once | CUTANEOUS | Status: DC

## 2020-06-27 MED ORDER — FENTANYL CITRATE (PF) 250 MCG/5ML IJ SOLN
INTRAMUSCULAR | Status: AC
  Filled 2020-06-27: qty 5

## 2020-06-27 MED ORDER — LIDOCAINE HCL (PF) 2 % IJ SOLN
INTRAMUSCULAR | Status: AC
  Filled 2020-06-27: qty 5

## 2020-06-27 MED ORDER — ACETAMINOPHEN 10 MG/ML IV SOLN
INTRAVENOUS | Status: DC | PRN
  Administered 2020-06-27: 1000 mg via INTRAVENOUS

## 2020-06-27 MED ORDER — METHYLPREDNISOLONE ACETATE 80 MG/ML IJ SUSP
INTRAMUSCULAR | Status: AC
  Filled 2020-06-27: qty 1

## 2020-06-27 MED ORDER — HEMOSTATIC AGENTS (NO CHARGE) OPTIME
TOPICAL | Status: DC | PRN
  Administered 2020-06-27: 1 via TOPICAL

## 2020-06-27 MED ORDER — FENTANYL CITRATE (PF) 250 MCG/5ML IJ SOLN
INTRAMUSCULAR | Status: DC | PRN
  Administered 2020-06-27 (×7): 50 ug via INTRAVENOUS

## 2020-06-27 MED ORDER — METHYLPREDNISOLONE ACETATE 80 MG/ML IJ SUSP
INTRAMUSCULAR | Status: DC | PRN
  Administered 2020-06-27: 80 mg

## 2020-06-27 MED ORDER — DEXMEDETOMIDINE (PRECEDEX) IN NS 20 MCG/5ML (4 MCG/ML) IV SYRINGE
PREFILLED_SYRINGE | INTRAVENOUS | Status: DC | PRN
  Administered 2020-06-27: 8 ug via INTRAVENOUS
  Administered 2020-06-27: 4 ug via INTRAVENOUS
  Administered 2020-06-27 (×2): 8 ug via INTRAVENOUS

## 2020-06-27 MED ORDER — VANCOMYCIN HCL 1000 MG IV SOLR
INTRAVENOUS | Status: DC | PRN
  Administered 2020-06-27: 1500 mg via INTRAVENOUS

## 2020-06-27 MED ORDER — 0.9 % SODIUM CHLORIDE (POUR BTL) OPTIME
TOPICAL | Status: DC | PRN
  Administered 2020-06-27: 1000 mL

## 2020-06-27 MED ORDER — BUPIVACAINE-EPINEPHRINE 0.5% -1:200000 IJ SOLN
INTRAMUSCULAR | Status: AC
  Filled 2020-06-27: qty 1

## 2020-06-27 MED ORDER — PROPOFOL 10 MG/ML IV BOLUS
INTRAVENOUS | Status: DC | PRN
  Administered 2020-06-27: 170 mg via INTRAVENOUS

## 2020-06-27 MED ORDER — BUPIVACAINE-EPINEPHRINE (PF) 0.5% -1:200000 IJ SOLN
INTRAMUSCULAR | Status: DC | PRN
  Administered 2020-06-27: 15 mL

## 2020-06-27 MED ORDER — LIDOCAINE-EPINEPHRINE 1 %-1:100000 IJ SOLN
INTRAMUSCULAR | Status: AC
  Filled 2020-06-27: qty 1

## 2020-06-27 MED ORDER — ORAL CARE MOUTH RINSE: 15.0000 mL | Freq: Once | OROMUCOSAL | Status: AC

## 2020-06-27 MED ORDER — ALBUMIN HUMAN 5 % IV SOLN: INTRAVENOUS | Status: DC | PRN

## 2020-06-27 MED ORDER — FENTANYL CITRATE (PF) 100 MCG/2ML IJ SOLN: 25.0000 ug | INTRAMUSCULAR | Status: DC | PRN

## 2020-06-27 MED ORDER — ACETAMINOPHEN 10 MG/ML IV SOLN
INTRAVENOUS | Status: AC
  Filled 2020-06-27: qty 100

## 2020-06-27 MED ORDER — FENTANYL CITRATE (PF) 100 MCG/2ML IJ SOLN
INTRAMUSCULAR | Status: AC
  Filled 2020-06-27: qty 2

## 2020-06-27 MED ORDER — PROPOFOL 10 MG/ML IV BOLUS
INTRAVENOUS | Status: AC
  Filled 2020-06-27: qty 40

## 2020-06-27 MED ORDER — MIDAZOLAM HCL 2 MG/2ML IJ SOLN
INTRAMUSCULAR | Status: DC | PRN
  Administered 2020-06-27: 2 mg via INTRAVENOUS

## 2020-06-27 MED ORDER — THROMBIN 5000 UNITS EX SOLR
CUTANEOUS | Status: AC
  Filled 2020-06-27: qty 5000

## 2020-06-27 MED ORDER — DEXMEDETOMIDINE (PRECEDEX) IN NS 20 MCG/5ML (4 MCG/ML) IV SYRINGE
PREFILLED_SYRINGE | INTRAVENOUS | Status: AC
  Filled 2020-06-27: qty 5

## 2020-06-27 MED ORDER — PHENYLEPHRINE 40 MCG/ML (10ML) SYRINGE FOR IV PUSH (FOR BLOOD PRESSURE SUPPORT)
PREFILLED_SYRINGE | INTRAVENOUS | Status: DC | PRN
  Administered 2020-06-27: 80 ug via INTRAVENOUS

## 2020-06-27 MED ORDER — SUCCINYLCHOLINE CHLORIDE 200 MG/10ML IV SOSY
PREFILLED_SYRINGE | INTRAVENOUS | Status: AC
  Filled 2020-06-27: qty 10

## 2020-06-27 MED ORDER — METHOCARBAMOL 500 MG PO TABS: 1000.0000 mg | ORAL_TABLET | Freq: Three times a day (TID) | ORAL | 1 refills | Status: DC | PRN

## 2020-06-27 MED ORDER — PROPOFOL 500 MG/50ML IV EMUL
INTRAVENOUS | Status: DC | PRN
  Administered 2020-06-27: 50 ug/kg/min via INTRAVENOUS

## 2020-06-27 MED ORDER — CHLORHEXIDINE GLUCONATE 0.12 % MT SOLN
15.0000 mL | Freq: Once | OROMUCOSAL | Status: AC
  Administered 2020-06-27: 15 mL via OROMUCOSAL
  Filled 2020-06-27: qty 15

## 2020-06-27 MED ORDER — THROMBIN 5000 UNITS EX SOLR
OROMUCOSAL | Status: DC | PRN
  Administered 2020-06-27: 5 mL via TOPICAL

## 2020-06-27 MED ORDER — DEXAMETHASONE SODIUM PHOSPHATE 10 MG/ML IJ SOLN
INTRAMUSCULAR | Status: AC
  Filled 2020-06-27: qty 1

## 2020-06-27 MED ORDER — LIDOCAINE 2% (20 MG/ML) 5 ML SYRINGE
INTRAMUSCULAR | Status: DC | PRN
  Administered 2020-06-27: 100 mg via INTRAVENOUS

## 2020-06-27 MED ORDER — SUCCINYLCHOLINE CHLORIDE 200 MG/10ML IV SOSY
PREFILLED_SYRINGE | INTRAVENOUS | Status: DC | PRN
  Administered 2020-06-27: 130 mg via INTRAVENOUS

## 2020-06-27 MED ORDER — OXYCODONE-ACETAMINOPHEN 7.5-325 MG PO TABS
1.0000 | ORAL_TABLET | ORAL | 0 refills | Status: DC | PRN
Start: 2020-06-27 — End: 2020-06-27

## 2020-06-27 MED ORDER — ROCURONIUM BROMIDE 10 MG/ML (PF) SYRINGE: PREFILLED_SYRINGE | INTRAVENOUS | Status: DC | PRN

## 2020-06-27 MED ORDER — MIDAZOLAM HCL 2 MG/2ML IJ SOLN
INTRAMUSCULAR | Status: AC
  Filled 2020-06-27: qty 2

## 2020-06-27 MED ORDER — LACTATED RINGERS IV SOLN: INTRAVENOUS | Status: DC

## 2020-06-27 MED ORDER — PHENYLEPHRINE 40 MCG/ML (10ML) SYRINGE FOR IV PUSH (FOR BLOOD PRESSURE SUPPORT)
PREFILLED_SYRINGE | INTRAVENOUS | Status: AC
  Filled 2020-06-27: qty 10

## 2020-06-27 MED FILL — METHOCARBAMOL 500 MG TABS: 500 | 15 days supply | Qty: 90 | Fill #0

## 2020-06-27 MED FILL — OXYCODONE-APAP 7.5-325MG: 7.5-325 | 5 days supply | Qty: 30 | Fill #0

## 2020-06-27 SURGICAL SUPPLY — 64 items
ADH SKN CLS APL DERMABOND .7 (GAUZE/BANDAGES/DRESSINGS) ×1
BAND INSRT 18 STRL LF DISP RB (MISCELLANEOUS) ×2
BAND RUBBER #18 3X1/16 STRL (MISCELLANEOUS) ×4 IMPLANT
BLADE SURG 11 STRL SS (BLADE) ×2 IMPLANT
BUR MATCHSTICK NEURO 3.0 LAGG (BURR) ×2 IMPLANT
CARTRIDGE OIL MAESTRO DRILL (MISCELLANEOUS) ×1 IMPLANT
CNTNR URN SCR LID CUP LEK RST (MISCELLANEOUS) IMPLANT
CONT SPEC 4OZ STRL OR WHT (MISCELLANEOUS)
COVER BACK TABLE 60X90IN (DRAPES) ×2 IMPLANT
COVER MAYO STAND STRL (DRAPES) ×2 IMPLANT
COVER WAND RF STERILE (DRAPES) ×1 IMPLANT
DECANTER SPIKE VIAL GLASS SM (MISCELLANEOUS) ×2 IMPLANT
DERMABOND ADVANCED (GAUZE/BANDAGES/DRESSINGS) ×1
DERMABOND ADVANCED .7 DNX12 (GAUZE/BANDAGES/DRESSINGS) IMPLANT
DIFFUSER DRILL AIR PNEUMATIC (MISCELLANEOUS) ×2 IMPLANT
DRAPE C-ARM 42X72 X-RAY (DRAPES) ×2 IMPLANT
DRAPE LAPAROTOMY 100X72X124 (DRAPES) ×2 IMPLANT
DRAPE MICROSCOPE LEICA (MISCELLANEOUS) ×2 IMPLANT
DRAPE SURG 17X23 STRL (DRAPES) ×2 IMPLANT
DRSG OPSITE POSTOP 4X6 (GAUZE/BANDAGES/DRESSINGS) ×1 IMPLANT
DURAPREP 26ML APPLICATOR (WOUND CARE) ×2 IMPLANT
ELECT BLADE INSULATED 6.5IN (ELECTROSURGICAL) ×2
ELECT REM PT RETURN 9FT ADLT (ELECTROSURGICAL) ×2
ELECTRODE BLDE INSULATED 6.5IN (ELECTROSURGICAL) ×1 IMPLANT
ELECTRODE REM PT RTRN 9FT ADLT (ELECTROSURGICAL) ×1 IMPLANT
GAUZE 4X4 16PLY RFD (DISPOSABLE) IMPLANT
GAUZE SPONGE 4X4 12PLY STRL (GAUZE/BANDAGES/DRESSINGS) ×2 IMPLANT
GLOVE BIOGEL PI IND STRL 8 (GLOVE) ×2 IMPLANT
GLOVE BIOGEL PI INDICATOR 8 (GLOVE) ×2
GLOVE ECLIPSE 8.0 STRL XLNG CF (GLOVE) ×4 IMPLANT
GOWN STRL REUS W/ TWL LRG LVL3 (GOWN DISPOSABLE) IMPLANT
GOWN STRL REUS W/ TWL XL LVL3 (GOWN DISPOSABLE) ×1 IMPLANT
GOWN STRL REUS W/TWL 2XL LVL3 (GOWN DISPOSABLE) IMPLANT
GOWN STRL REUS W/TWL LRG LVL3 (GOWN DISPOSABLE)
GOWN STRL REUS W/TWL XL LVL3 (GOWN DISPOSABLE) ×2
HEMOSTAT POWDER KIT SURGIFOAM (HEMOSTASIS) ×2 IMPLANT
KIT BASIN OR (CUSTOM PROCEDURE TRAY) ×2 IMPLANT
KIT POSITION SURG JACKSON T1 (MISCELLANEOUS) ×2 IMPLANT
KIT TURNOVER KIT B (KITS) ×2 IMPLANT
MARKER SKIN DUAL TIP RULER LAB (MISCELLANEOUS) ×2 IMPLANT
NDL HYPO 18GX1.5 BLUNT FILL (NEEDLE) IMPLANT
NDL SPNL 18GX3.5 QUINCKE PK (NEEDLE) ×1 IMPLANT
NEEDLE HYPO 18GX1.5 BLUNT FILL (NEEDLE) ×2 IMPLANT
NEEDLE HYPO 25X1 1.5 SAFETY (NEEDLE) ×2 IMPLANT
NEEDLE SPNL 18GX3.5 QUINCKE PK (NEEDLE) ×2 IMPLANT
NS IRRIG 1000ML POUR BTL (IV SOLUTION) ×2 IMPLANT
OIL CARTRIDGE MAESTRO DRILL (MISCELLANEOUS) ×2
PACK LAMINECTOMY NEURO (CUSTOM PROCEDURE TRAY) ×2 IMPLANT
PAD ARMBOARD 7.5X6 YLW CONV (MISCELLANEOUS) ×6 IMPLANT
PATTIES SURGICAL .5 X.5 (GAUZE/BANDAGES/DRESSINGS) IMPLANT
PATTIES SURGICAL .5 X1 (DISPOSABLE) IMPLANT
PATTIES SURGICAL 1X1 (DISPOSABLE) IMPLANT
SPONGE LAP 4X18 RFD (DISPOSABLE) IMPLANT
SPONGE SURGIFOAM ABS GEL SZ50 (HEMOSTASIS) ×1 IMPLANT
STAPLER VISISTAT 35W (STAPLE) IMPLANT
SUT VIC AB 0 CT1 27 (SUTURE)
SUT VIC AB 0 CT1 27XBRD ANBCTR (SUTURE) IMPLANT
SUT VIC AB 2-0 CP2 18 (SUTURE) ×2 IMPLANT
SUT VIC AB 3-0 SH 8-18 (SUTURE) IMPLANT
SYR 3ML LL SCALE MARK (SYRINGE) ×1 IMPLANT
TOWEL GREEN STERILE (TOWEL DISPOSABLE) IMPLANT
TOWEL GREEN STERILE FF (TOWEL DISPOSABLE) IMPLANT
TRAY FOLEY MTR SLVR 16FR STAT (SET/KITS/TRAYS/PACK) ×1 IMPLANT
WATER STERILE IRR 1000ML POUR (IV SOLUTION) ×2 IMPLANT

## 2020-06-27 NOTE — Transfer of Care (Signed)
Immediate Anesthesia Transfer of Care Note  Patient: Dana Holland  Procedure(s) Performed: MIS MICRODISCECTOMY RIGHT, LUMBAR FIVE-SACRAL ONE, LUMBAR THREE-FOUR BILATERAL MIS LAMINECTOMY (N/A )  Patient Location: PACU  Anesthesia Type:General  Level of Consciousness: drowsy  Airway & Oxygen Therapy: Patient Spontanous Breathing and Patient connected to face mask oxygen  Post-op Assessment: Report given to RN and Post -op Vital signs reviewed and stable  Post vital signs: Reviewed and stable  Last Vitals:  Vitals Value Taken Time  BP 104/55 06/27/20 1120  Temp 37.1 C 06/27/20 1118  Pulse 94 06/27/20 1124  Resp 37 06/27/20 1123  SpO2 99 % 06/27/20 1124  Vitals shown include unvalidated device data.  Last Pain:  Vitals:   06/27/20 1118  PainSc: (P) Asleep         Complications: No complications documented.

## 2020-06-27 NOTE — Progress Notes (Signed)
   Providing Compassionate, Quality Care - Together  NEUROSURGERY PROGRESS NOTE   S: pt s/e in pacu, no new complaints  O: EXAM:  BP 118/88   Pulse 94   Temp 98.7 F (37.1 C)   Resp 12   Ht 5\' 4"  (1.626 m)   Wt 103.6 kg   SpO2 99%   BMI 39.20 kg/m   Awake, alert, oriented  PERRL Speech fluent, appropriate  CNs grossly intact  5/5 BUE/BLE except RLE DF/EHL 4/5  Incision c/d/i  ASSESSMENT:  59 y.o. female with  1. 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  Please do not hesitate to call with questions or concerns.   46, DO Neurosurgeon [facility] Cell: [phone]

## 2020-06-27 NOTE — H&P (Signed)
Providing Compassionate, Quality Care - Together  NEUROSURGERY HISTORY & PHYSICAL   Dana Holland is an 59 y.o. female.   Chief Complaint: BLE pain HPI: This is a 59 yo F with a history of L4-5 fusion in 2017 that complains of RLE radic with foot weakness since July. She also complains of BLE buttock and thigh pain, worse on the left. MRI revealed a L5-S1 HNP on the right and L3-4 stenosis with a synovial cyst on the left and EMG revealed severe L5 radiculopathy. Conservative measures failed and she agreed to proceed with surgical intervention.  Past Medical History:  Diagnosis Date  . Anxiety   . Asthma   . Bipolar disorder (HCC)    takes Lamictal daily  . Chronic back pain   . Chronic back pain   . COPD (chronic obstructive pulmonary disease) (HCC)   . DDD (degenerative disc disease)   . DDD (degenerative disc disease), lumbar   . DDD (degenerative disc disease), lumbar   . Depression    takes Prozac daily  . DJD (degenerative joint disease)   . Fibromyalgia   . Headache    migraines  . History of kidney stones   . History of palpitations    "I was checked out by Dr. Antoine Poche and everything okay"  . Hypertension   . Insomnia    takes Ambien nightly  . Lumbar stenosis with neurogenic claudication   . Neck pain   . Seasonal allergies   . Shortness of breath dyspnea    with exertion    Past Surgical History:  Procedure Laterality Date  . ABDOMINAL EXPLORATION SURGERY    . ABDOMINAL HYSTERECTOMY    . ANTERIOR CERVICAL DECOMP/DISCECTOMY FUSION N/A 08/03/2015   Procedure: ACDF C5-C6  C6-C7;  Surgeon: Shirlean Kelly, MD;  Location: [facility];  Service: Neurosurgery;  Laterality: N/A;  ACDF C5-C6  C6-C7  . BACK SURGERY     lower back  . BREAST SURGERY    . CARPAL TUNNEL RELEASE Right 11/29/2015   Procedure: CARPAL TUNNEL RELEASE-Right;  Surgeon: Shirlean Kelly, MD;  Location: [facility];  Service: Neurosurgery;  Laterality: Right;  CARPAL TUNNEL RELEASE-Right    . CARPAL TUNNEL RELEASE Left 05/23/2016   Procedure: Left CARPAL TUNNEL RELEASE with Bier block;  Surgeon: Shirlean Kelly, MD;  Location: [facility];  Service: Neurosurgery;  Laterality: Left;  Left CARPAL TUNNEL RELEASE with Bier block  . CHOLECYSTECTOMY    . DILATION AND CURETTAGE OF UTERUS    . KNEE SURGERY    . MULTIPLE TOOTH EXTRACTIONS    . TUBAL LIGATION      Family History  Problem Relation Age of Onset  . Heart disease Mother   . Arthritis Mother   . Hyperlipidemia Mother   . Hypertension Father   . Heart disease Father   . Diabetes Father   . Hyperlipidemia Father   . Hypertension Other   . Heart disease Other   . Arthritis Sister   . COPD Sister   . Depression Sister   . Hyperlipidemia Sister   . Hyperlipidemia Brother   . Hypertension Brother   . Asthma Son   . Arthritis Maternal Grandmother   . Diabetes Maternal Grandmother   . Heart disease Maternal Grandmother   . Heart disease Maternal Grandfather   . Arthritis Paternal Grandmother   . Heart disease Paternal Grandmother   . Heart disease Paternal Grandfather    Social History:  reports that she has been smoking cigarettes.  She has a 20.00 pack-year smoking history. She has never used smokeless tobacco. She reports current drug use. Drug: Marijuana. She reports that she does not drink alcohol.  Allergies:  Allergies  Allergen Reactions  . Imitrex [Sumatriptan] Other (See Comments)    Blood vessels in neck felt like they were going to burst, potential stroke  . Remeron [Mirtazapine] Other (See Comments)    Chest pain  . Wellbutrin [Bupropion] Hives, Shortness Of Breath, Swelling and Rash    SWELLING REACTION UNSPECIFIED   . Ibuprofen Hives, Swelling and Rash    SWELLING REACTION UNSPECIFIED   . Keflex [Cephalexin] Hives and Swelling  . Nsaids Hives, Swelling and Rash  . Paroxetine Hcl Other (See Comments)    "FELT LIKE BUGS BITING ALL OVER"  . Toradol [Ketorolac Tromethamine] Hives, Swelling and Rash   . Vioxx [Rofecoxib] Hives, Swelling and Rash    SWELLING REACTION UNSPECIFIED   . Bextra [Valdecoxib]     UNSPECIFIED REACTION   . Buspar [Buspirone]     UNSPECIFIED REACTION   . Compazine [Prochlorperazine Edisylate] Other (See Comments)    "Felt like pt was trapped, hallucinations"  . Cymbalta [Duloxetine Hcl] Other (See Comments)    UNSPECIFIED REACTION   . Effexor [Venlafaxine] Other (See Comments)    UNSPECIFIED REACTION   . Sulfa Antibiotics Hives and Swelling  . Voltaren [Diclofenac Sodium] Other (See Comments)    Severe intestinal bleeding, stomach in knots  . Tramadol Other (See Comments)    Severe headache/migraine    Medications Prior to Admission  Medication Sig Dispense Refill  . cloNIDine (CATAPRES) 0.2 MG tablet Take 0.2 mg by mouth at bedtime.    . ergocalciferol (VITAMIN D2) 1.25 MG (50000 UT) capsule Take 50,000 Units by mouth every Thursday.    . fluticasone (FLONASE) 50 MCG/ACT nasal spray PLACE 1 SPRAY INTO BOTH NOSTRILS DAILY AS NEEDED FOR ALLERGIES. 16 g 0  . gabapentin (NEURONTIN) 600 MG tablet Take 600-1,200 mg by mouth See admin instructions. 600 mg twice a day and 1200 mg at bedtime  0  . hydrOXYzine (VISTARIL) 50 MG capsule Take 50 mg by mouth in the morning, at noon, and at bedtime.    Marland Kitchen lisinopril (ZESTRIL) 20 MG tablet Take 20 mg by mouth daily.    . methocarbamol (ROBAXIN) 500 MG tablet Take 2 tablets (1,000 mg total) by mouth 2 (two) times daily. (Patient taking differently: Take 500 mg by mouth 2 (two) times daily. ) 28 tablet 0  . Oxycodone HCl 10 MG TABS Take 10 mg by mouth in the morning, at noon, in the evening, and at bedtime.    Marland Kitchen PROVENTIL HFA 108 (90 Base) MCG/ACT inhaler INHALE 2 PUFFS INTO THE LUNGS EVERY 6 (SIX) HOURS AS NEEDED FOR WHEEZING OR SHORTNESS OF BREATH. 6.7 Inhaler 0  . tetrahydrozoline-zinc (VISINE-AC) 0.05-0.25 % ophthalmic solution Place 2 drops into both eyes 3 (three) times daily as needed (eye allergies).     . zolpidem  (AMBIEN CR) 6.25 MG CR tablet Take 6.25 mg by mouth at bedtime as needed for sleep.    Marland Kitchen aspirin 325 MG EC tablet Take 325 mg by mouth daily as needed for pain.    . diphenhydrAMINE (BENADRYL) 25 MG tablet Take 25 mg by mouth every 6 (six) hours as needed for allergies.      Results for orders placed or performed during the hospital encounter of 06/27/20 (from the past 48 hour(s))  Protime-INR     Status: None  Collection Time: 06/27/20  6:29 AM  Result Value Ref Range   Prothrombin Time 13.5 11.4 - 15.2 seconds   INR 1.1 0.8 - 1.2    Comment: (NOTE) INR goal varies based on device and disease states. Performed at [facility], [address] [place], [place] [address]    No results found.  ROS 14 pt performed, +/- in HPI  Blood pressure (!) 143/60, pulse 93, temperature 98.6 F (37 C), height 5\' 4"  (1.626 m), weight 103.6 kg, SpO2 96 %. Physical Exam  AOx3 PERRL EOMI FC4 BUE 5/5 RLE 4/5 except DF/EHL 3/5 LLE 4+/5 SILT except right L5 dermatome  Assessment/Plan 59 yo F with:  1. L5-S1 right HNP, with R L5 radiculopathy 2. 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  Thank you for allowing me to participate in this patient's care.  Please do not hesitate to call with questions or concerns.   46, DO Neurosurgeon [facility] Cell: [phone]

## 2020-06-27 NOTE — Op Note (Signed)
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 L5-S1 right laminectomy and microdiscectomy for decompression of right L5, S1 nerve roots 2. L3-4 bilateral laminectomy for resection of synovial cyst and decompression of neural elements 3. Use of operating microscope 4. Use of intraoperative fluoroscopy  SURGEON: Dr. Kendell Bane C. Sabrie Moritz, DO  ASSISTANT: Dr. Altamease Oiler, MD  ANESTHESIA: General Endotracheal  EBL: 50cc  SPECIMENS: None  DRAINS: None  COMPLICATIONS: None  CONDITION: hemodynamically stable  HISTORY: Dana Holland is a 59 y.o. female presented with neurogenic claudication and right L5 radiculopathy.  MRI revealed L3-4 severe stenosis with a left-sided synovial cyst, L5-S1 superiorly migrated herniated nucleus pulposus on the right with compression of the L5 nerve root.  EMG revealed severe L5 radiculopathy.  She had a history of an L4-5 lumbar fusion in 2017.  We tried conservative measures including medication for pain control however her right lower extremity pain became worse and she was having difficulty ambulating due to her weakness in her foot.  She also complained of bilateral thigh and buttock pain she ambulated consistent with neurogenic claudication.  All risks and benefit of surgical intervention were discussed and agreed upon.  PROCEDURE IN DETAIL: After informed consent was obtained and witnessed, the patient was brought to the operating room. After induction of general anesthesia, the patient was positioned on the operative table in the prone position with all pressure points meticulously padded. The skin of the low back was then prepped and draped in the usual sterile fashion.  Under fluoroscopy, the L5-S1 level was identified and marked out on the skin, and after timeout was  conducted, the skin was infiltrated with local anesthetic. Skin incision was then made sharply and Bovie electrocautery was used to dissect the subcutaneous tissue until the lumbodorsal fascia was identified. The fascia was then incised using Bovie electrocautery and the Metrix dilators were used to dilate over the right L5 lamina and facet.  A 20 mm tapered Metrix tube was then placed in the wound.  Lateral fluoroscopy confirmed appropriate placement over the L5 lamina, L5-S1 right facet.  The microscope was then draped sterilely and brought into the field.  Using Bovie cautery the soft tissue was cleared free of the right L5 lamina and medial facet joint.  Using a high-speed drill, laminotomy was completed with a partial medial facetectomy.  The lamina was drilled superiorly to the level of the pedicle, with careful appreciation to maintain the pars interarticularis.  The ligamentum flavum was then identified and removed and the lateral edge of the thecal sac was identified. This was then traced down to identify the traversing nerve root. Dissection was then carried out superior and lateral to the nerve root to identify the disc herniation. The posterior annulus was then incised and using a combination of dissectors, curettes, and rongeurs, the herniated disc fragment was removed.  Using a Murphy ball probes, the superiorly migrated fragments of disc were carefully dissected free and removed.  Using lateral fluoroscopy, the Murphy ball probe was noted to be superior the L5 pedicle.  The L5 nerve root and S1 nerve root appeared decompressed and were freely dissected with the nerve hook.  Hemostasis was achieved with bipolar cautery.  The decompression of the nerve roots again was confirmed using a dissector.  Hemostasis was again secured using a combination of morcellized Gelfoam and thrombin  and bipolar electrocautery. The wound is irrigated with copious amounts of antibiotic saline irrigation and noted to be  excellently hemostatic. The nerve root was then covered with a long-acting steroid solution.  The Metrix tube was then removed.  Using lateral fluoroscopy the L3-4 interspace was marked.  Incision was made sharply and careful dissection with Bovie cautery down to the lumbosacral fascia was performed.  The fascia was incised and using the Metrix dilators the right L3 lamina was docked and dilated.  A 20 mm tube was placed.  Lateral fluoroscopy confirmed appropriate placement.  The microscope was again brought into the field and the L3 lamina was cleared free of soft tissue as well as the right L3-4 medial facet joint.  Using a high-speed drill, a bilateral laminectomy was performed to the ligamentous attachment of the ligamentum flavum.  The ligamentum flavum was carefully dissected from the thecal sac bilaterally using a series of micro curettes.  There is a sizable left synovial cyst encountered which was resected with the ligamentum flavum with Kerrison rongeurs.  This was performed bilaterally into the lateral recess.  Using a Murphy ball probe, the nerve roots were explored and felt to be decompressed bilaterally.  Hemostasis was achieved with Surgi-Flo and bipolar cautery.  Copious amounts of irrigation were used and the wound was noted to be excellently hemostatic.  Long-acting steroid was placed over the thecal sac.  The Metrix tube was removed. The wound was noted to be hemostatic.  Both wounds were closed in layers with 2-0 Vicryl stitches. The skin was closed using standard skin glue.  At the end of the case all sponge, needle, and instrument counts were correct. The patient was then transferred to the stretcher and taken to the postanesthesia care unit in stable hemodynamic condition.

## 2020-06-27 NOTE — Anesthesia Postprocedure Evaluation (Signed)
Anesthesia Post Note  Patient: Dana Holland  Procedure(s) Performed: MIS MICRODISCECTOMY RIGHT, LUMBAR FIVE-SACRAL ONE, LUMBAR THREE-FOUR BILATERAL MIS LAMINECTOMY (N/A )     Patient location during evaluation: PACU Anesthesia Type: General Level of consciousness: awake Pain management: pain level controlled Respiratory status: spontaneous breathing Cardiovascular status: stable Postop Assessment: no apparent nausea or vomiting Anesthetic complications: no   No complications documented.  Last Vitals:  Vitals:   06/27/20 1230 06/27/20 1245  BP: (!) 100/49 103/61  Pulse: 88 90  Resp: 18 13  Temp:  37.1 C  SpO2: 94% 98%    Last Pain:  Vitals:   06/27/20 1245  PainSc: 4                  Cortlan Dolin

## 2020-06-27 NOTE — Anesthesia Procedure Notes (Signed)
Procedure Name: Intubation Date/Time: 06/27/2020 8:12 AM Performed by: Reece Agar, CRNA Pre-anesthesia Checklist: Patient identified, Emergency Drugs available, Suction available and Patient being monitored Patient Re-evaluated:Patient Re-evaluated prior to induction Oxygen Delivery Method: Circle System Utilized Preoxygenation: Pre-oxygenation with 100% oxygen Induction Type: IV induction Ventilation: Mask ventilation without difficulty Laryngoscope Size: Mac and 3 Grade View: Grade I Tube type: Oral Tube size: 7.0 mm Number of attempts: 1 Airway Equipment and Method: Stylet Placement Confirmation: ETT inserted through vocal cords under direct vision,  positive ETCO2 and breath sounds checked- equal and bilateral Secured at: 22 cm Tube secured with: Tape Dental Injury: Teeth and Oropharynx as per pre-operative assessment

## 2020-06-27 NOTE — Discharge Instructions (Signed)
OK TO SHOWER IN 3 DAYS TAKE BANDAGE OFF AFTER SHOWER, LEAVE OFF NO BENDING/LIFTING/TWISTING MORE THAN 10LBS

## 2020-06-28 ENCOUNTER — Encounter (HOSPITAL_COMMUNITY): Payer: Self-pay | Admitting: Neurological Surgery

## 2020-10-26 LAB — EXTERNAL GENERIC LAB PROCEDURE

## 2021-02-12 ENCOUNTER — Other Ambulatory Visit: Payer: Self-pay | Admitting: Neurological Surgery

## 2021-02-12 DIAGNOSIS — M5030 Other cervical disc degeneration, unspecified cervical region: Secondary | ICD-10-CM

## 2021-02-12 DIAGNOSIS — M48062 Spinal stenosis, lumbar region with neurogenic claudication: Secondary | ICD-10-CM

## 2021-02-26 ENCOUNTER — Ambulatory Visit
Admission: RE | Admit: 2021-02-26 | Discharge: 2021-02-26 | Disposition: A | Discharge status: Home / Self Care | Payer: Medicaid Other | Source: Ambulatory Visit | Attending: Neurological Surgery | Admitting: Neurological Surgery

## 2021-02-26 ENCOUNTER — Other Ambulatory Visit: Payer: Self-pay

## 2021-02-26 DIAGNOSIS — M48062 Spinal stenosis, lumbar region with neurogenic claudication: Secondary | ICD-10-CM

## 2021-02-26 DIAGNOSIS — M5030 Other cervical disc degeneration, unspecified cervical region: Secondary | ICD-10-CM

## 2021-02-26 IMAGING — MR MR CERVICAL SPINE W/O CM
4 of 5 series · 29 of 48 positions shown · non-contrast
Comparison: Prior CT from [DATE].

CLINICAL DATA: Initial evaluation for neck pain with left shoulder
pain. Prior surgery.

EXAM:
MRI CERVICAL SPINE WITHOUT CONTRAST
TECHNIQUE: Multiplanar, multisequence MR imaging of the cervical spine was
performed. No intravenous contrast was administered.

[Series 3: T2 · sagittal · 3.0mm · 0.66mm/px · 8 of 18 slices shown (1 of 2)]
[im 1/18]
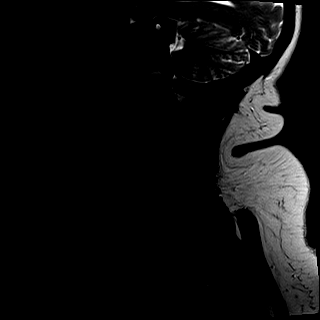
[im 3/18]
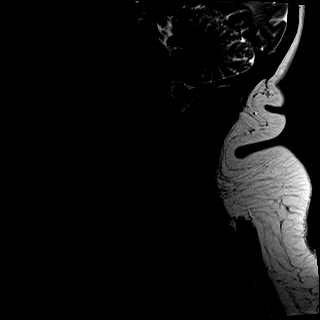
[im 5/18]
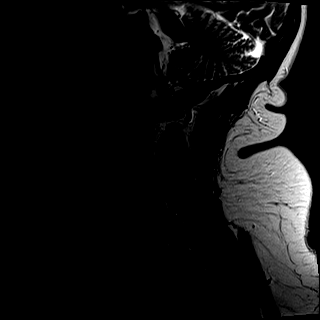
[im 8/18]
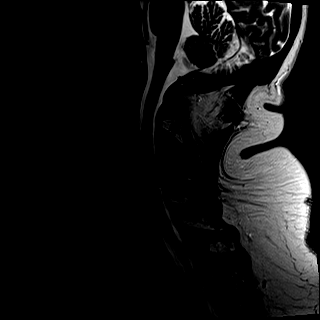
[im 10/18]
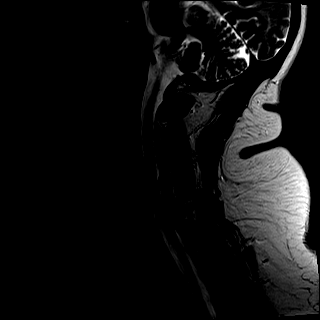
[im 13/18]
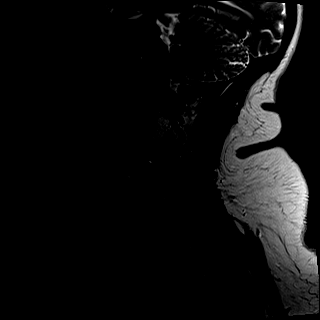
[im 15/18]
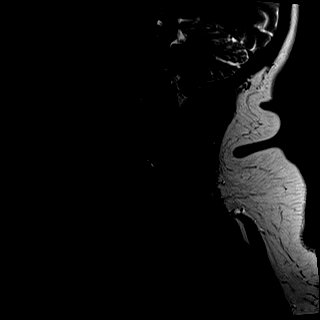
[im 18/18]
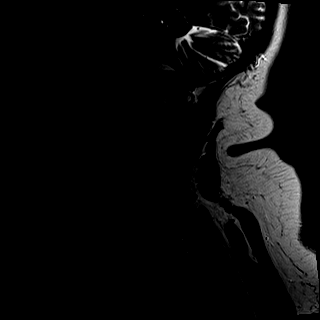

[Series 4: T1 · sagittal · 3.0mm · 0.41mm/px · 8 of 18 slices shown]
[im 1/18]
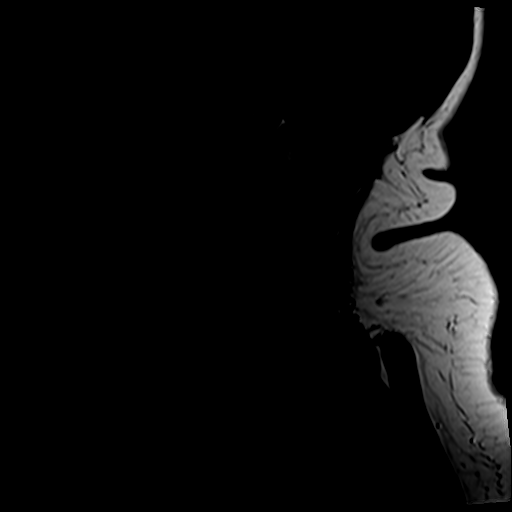
[im 3/18]
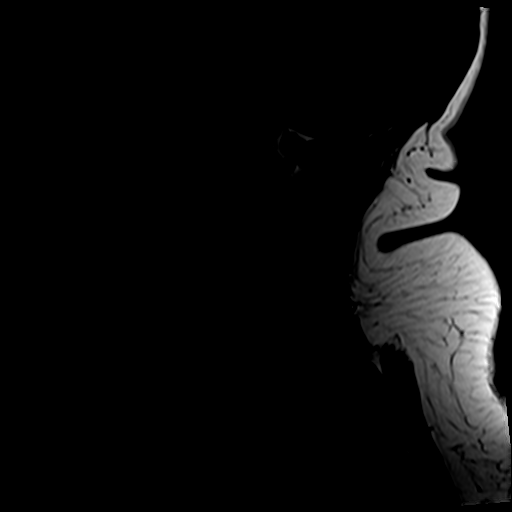
[im 5/18]
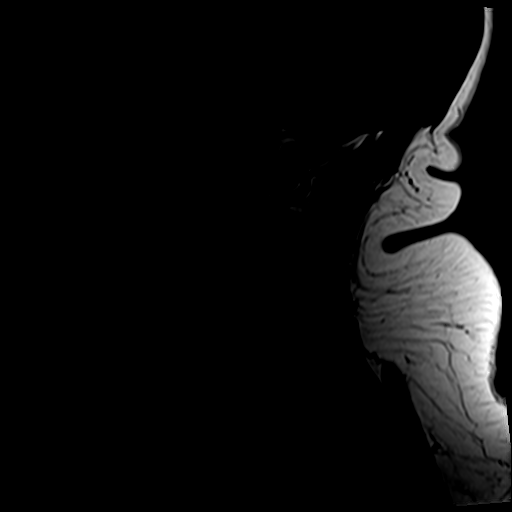
[im 8/18]
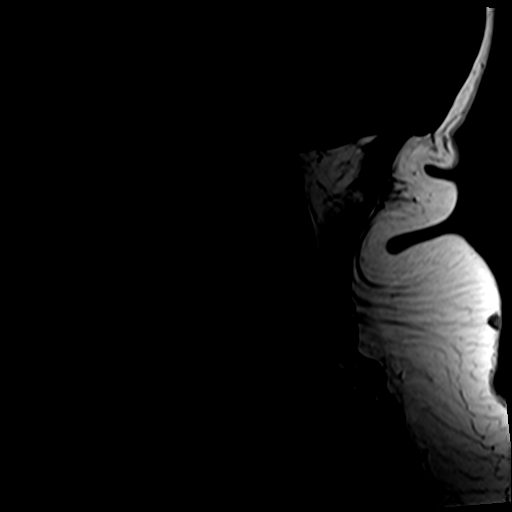
[im 10/18]
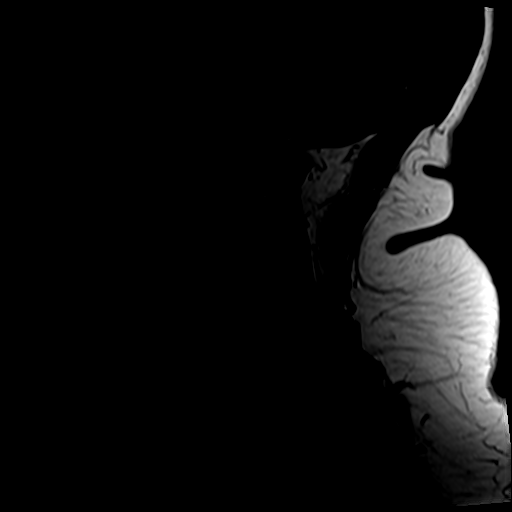
[im 13/18]
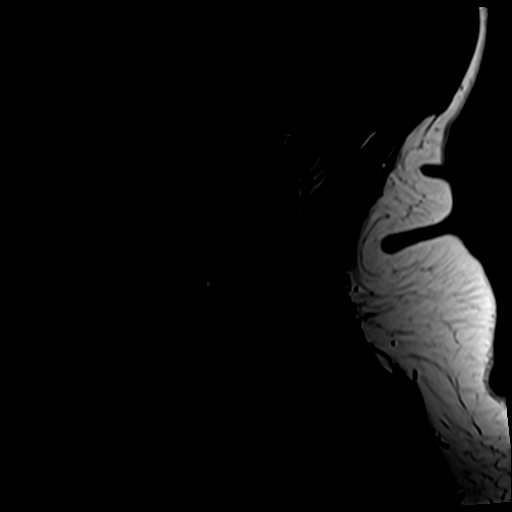
[im 15/18]
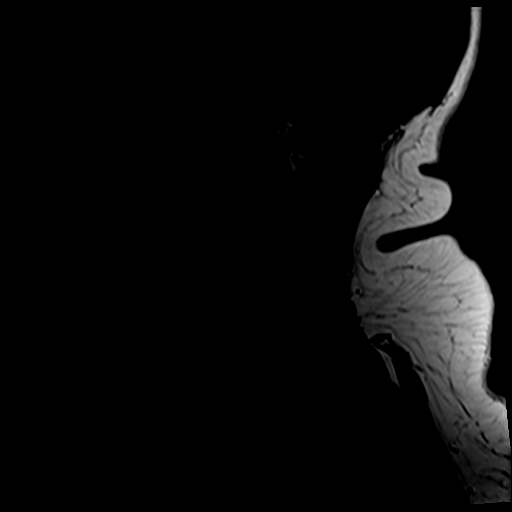
[im 18/18]
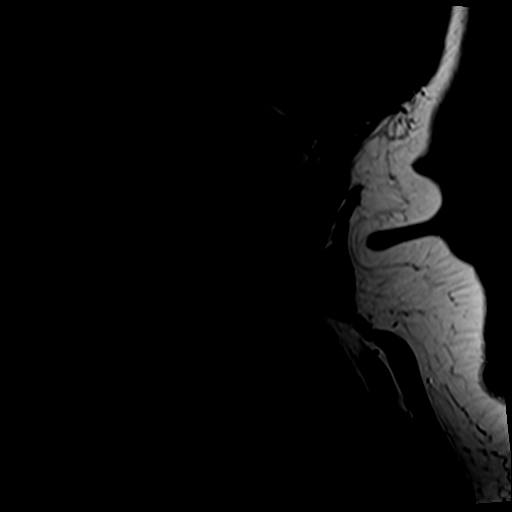

[Series 5: tir sag · sagittal · 3.0mm · 0.41mm/px · 4 of 18 slices shown]
[im 1/18]
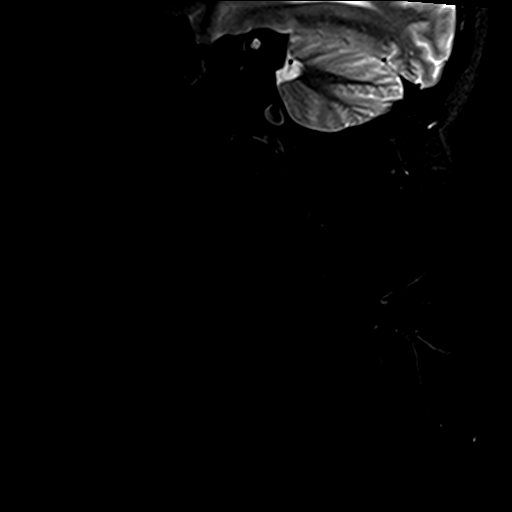
[im 3/18]
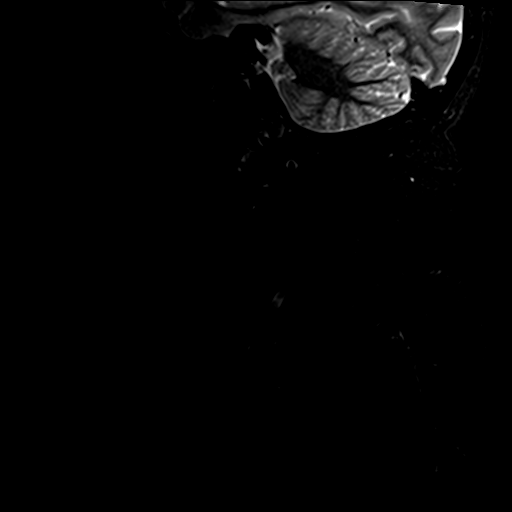
[im 10/18]
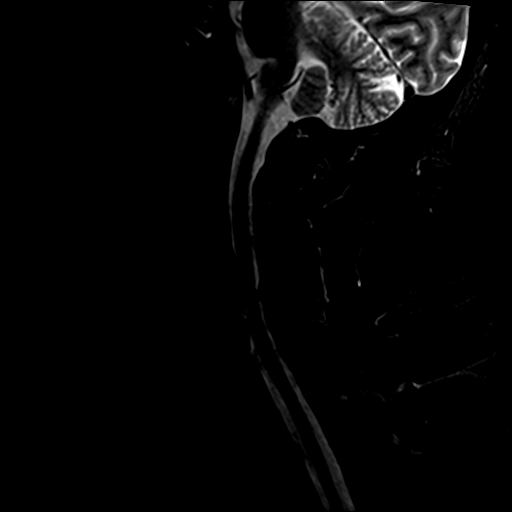
[im 15/18]
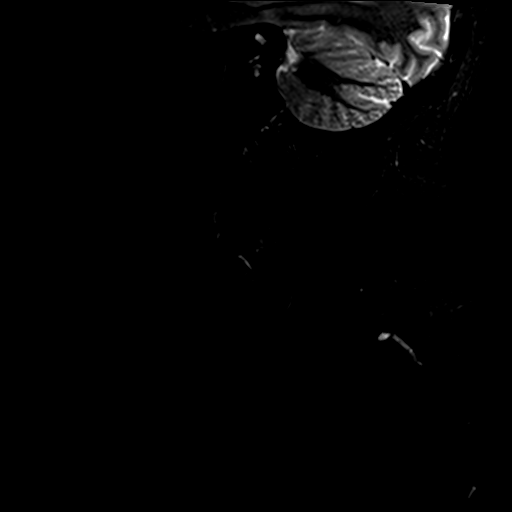

[Series 7: T2 · axial · 3.0mm · 0.70mm/px · z∈[-42,+46]mm · 9 of 25 slices shown (2 of 2)]
[im 1/25]
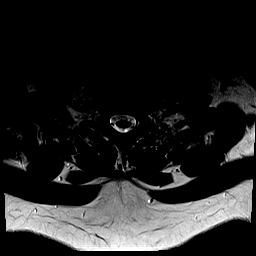
[im 5/25]
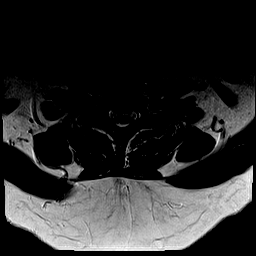
[im 7/25]
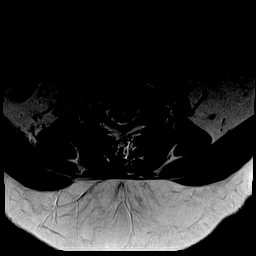
[im 11/25]
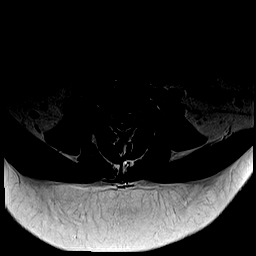
[im 14/25]
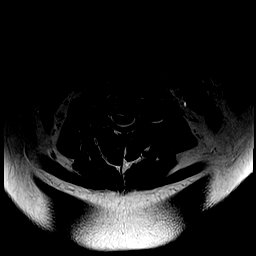
[im 18/25]
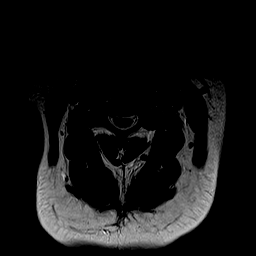
[im 20/25]
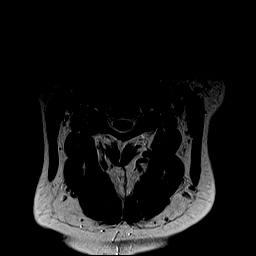
[im 22/25]
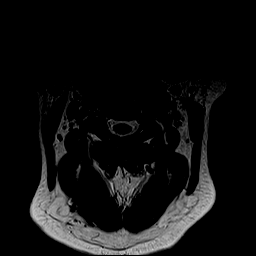
[im 25/25]
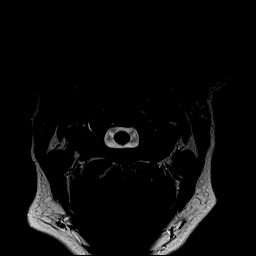

[29 of 48 positions shown; findings below may reference images not displayed]

FINDINGS: Alignment: Straightening of the normal cervical lordosis. Trace
anterolisthesis of C3 on C4 and C4 on C5 as well as C7 on T1.
Findings chronic and facet mediated.

Vertebrae: Susceptibility artifact related to prior ACDF at C5-C7.
Vertebral body height maintained. Bone marrow signal intensity
within normal limits. No discrete or worrisome osseous lesions.
Reactive marrow edema present about the left C4-5 facet due to facet
arthritis (series 5, image 15). No other abnormal marrow edema.

Cord: Normal signal and morphology.

Posterior Fossa, vertebral arteries, paraspinal tissues: Visualized
brain and posterior fossa within normal limits. Craniocervical
junction normal. Paraspinous and prevertebral soft tissues within
normal limits. Normal intravascular flow voids seen within the
vertebral arteries bilaterally.

Disc levels:

C2-C3: Unremarkable.

C3-C4: Minimal disc bulge with bilateral facet hypertrophy. No canal
or foraminal stenosis.

C4-C5: Mild disc bulge with left greater than right uncovertebral
spurring. Moderate left-sided facet arthrosis with associated
reactive marrow edema and joint effusion. Mild spinal stenosis
without cord impingement. Moderate left with mild right C5 foraminal
stenosis.

C5-C6: Prior fusion. Central endplate spurring indents the ventral
thecal sac with resultant mild spinal stenosis. Mild cord flattening
without cord signal changes (series 7, image 15). Left-sided
uncovertebral spurring with resultant mild left C6 foraminal
narrowing. Right neural foramina remains patent.

C6-C7: Prior fusion. Mild bilateral facet hypertrophy. No canal or
foraminal stenosis.

C7-T1: Trace anterolisthesis without significant disc bulge.
Moderate right-sided facet hypertrophy. No canal or foraminal
stenosis.

T1-2: Unremarkable.

T2-3: Seen only on sagittal projection. Right paracentral disc
extrusion with superior migration (series 3, image 8). No
significant stenosis.
IMPRESSION: 1. Prior ACDF at C5-C7. Residual endplate and uncovertebral spurring
at C5-6 with residual mild canal and left C6 foraminal stenosis.
2. Adjacent segment disease at C4-5 with resultant mild canal with
moderate left C5 foraminal stenosis.
3. Reactive marrow edema about the left C4-5 facet due to facet
arthritis. Finding could serve as a source for neck pain and
left-sided symptoms.
4. Small right paracentral disc extrusion at T2-3 without stenosis.

## 2021-02-26 IMAGING — MR MR LUMBAR SPINE W/O CM
4 of 5 series · 26 of 48 positions shown · non-contrast
Comparison: [DATE]

CLINICAL DATA: Lumbar stenosis with neurogenic claudication

EXAM:
MRI LUMBAR SPINE WITHOUT CONTRAST
TECHNIQUE: Multiplanar, multisequence MR imaging of the lumbar spine was
performed. No intravenous contrast was administered.

[Series 3: T2 · sagittal · 4.0mm · 0.53mm/px · 6 of 15 slices shown (1 of 2)]
[im 1/15]
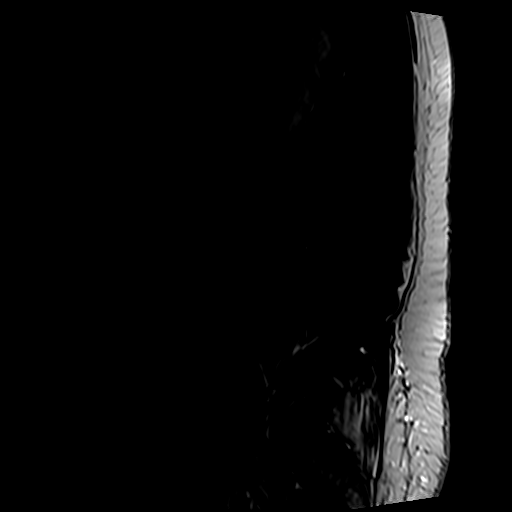
[im 3/15]
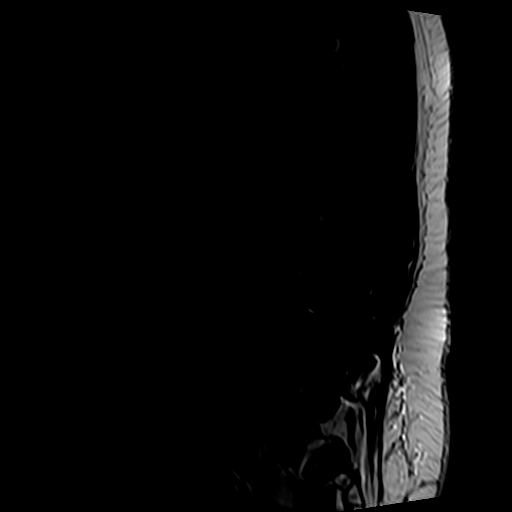
[im 6/15]
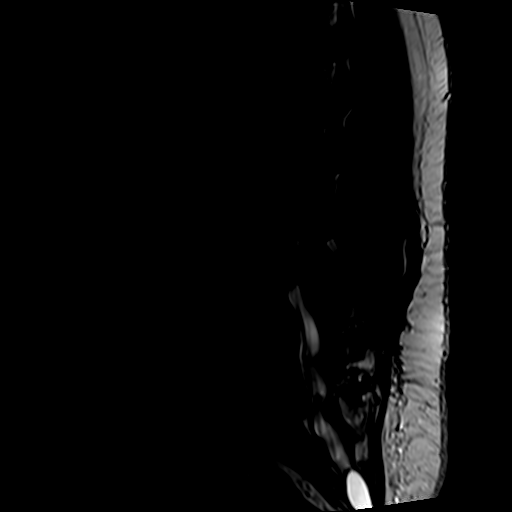
[im 9/15]
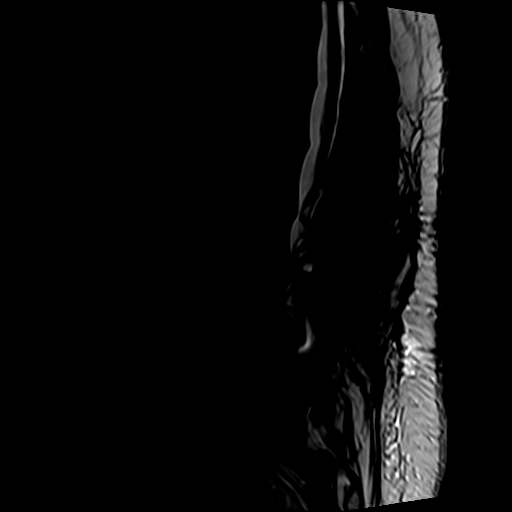
[im 12/15]
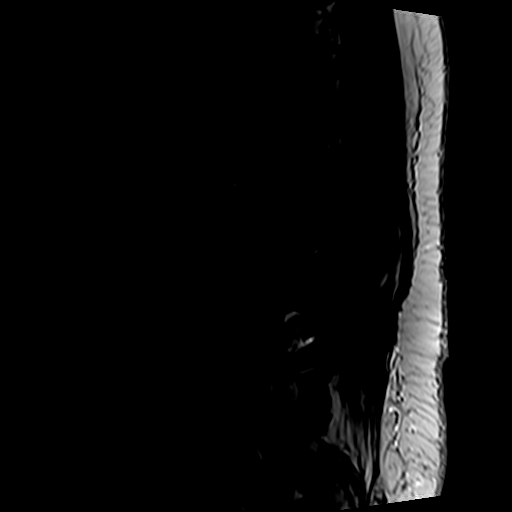
[im 15/15]
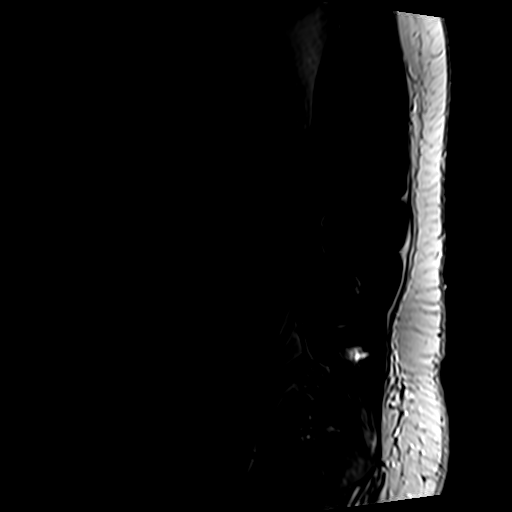

[Series 5: T1 · sagittal · 4.0mm · 0.53mm/px · 6 of 15 slices shown (1 of 2)]
[im 1/15]
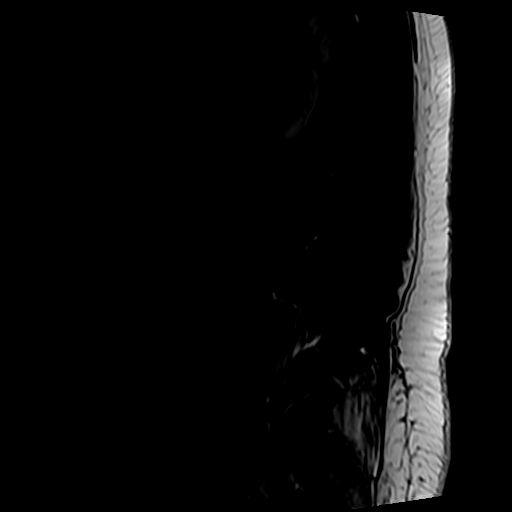
[im 3/15]
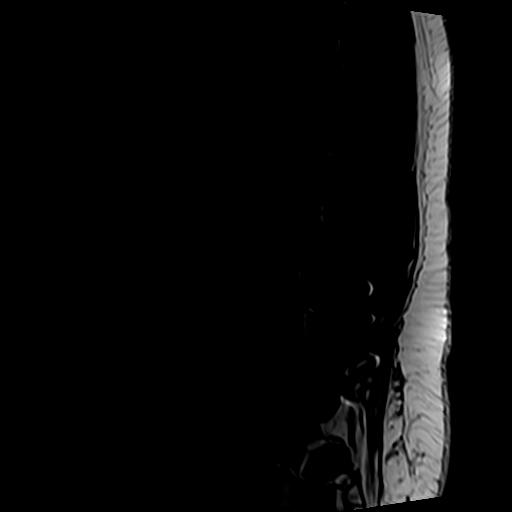
[im 6/15]
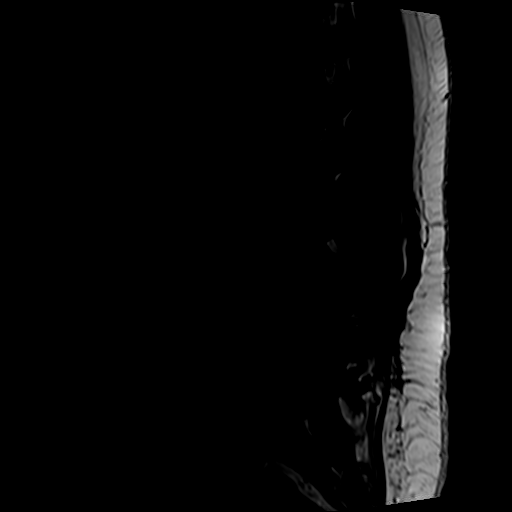
[im 9/15]
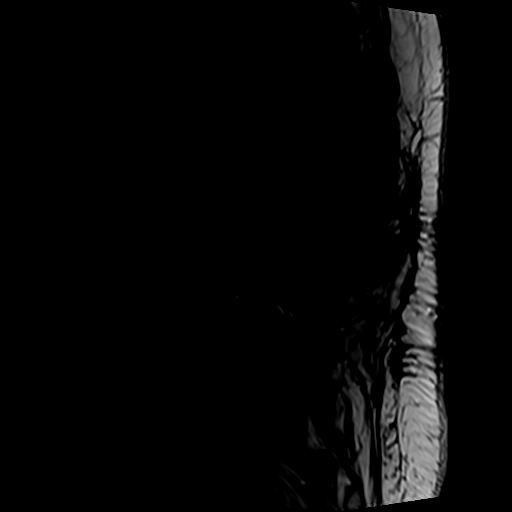
[im 12/15]
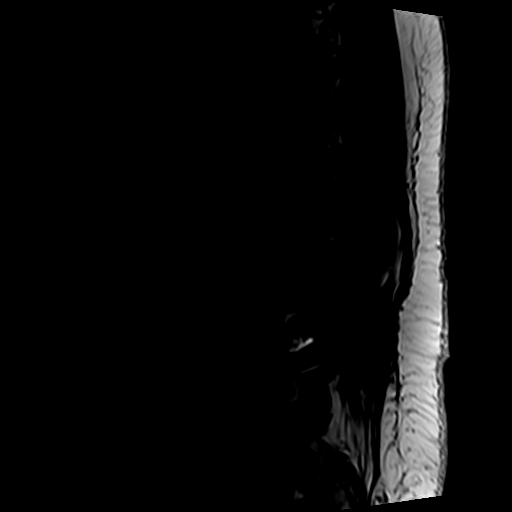
[im 15/15]
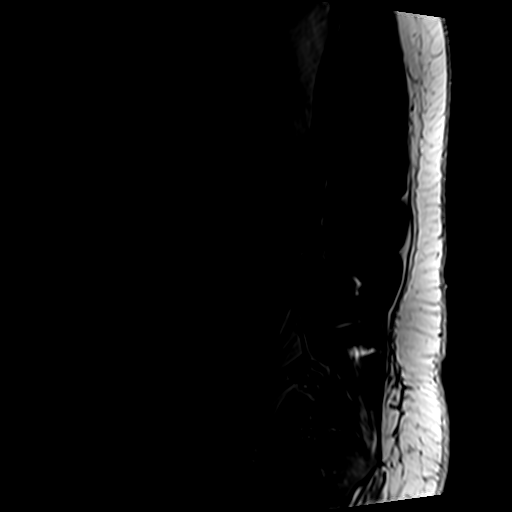

[Series 6: T2 · axial · 4.0mm · 0.70mm/px · z∈[-115,+94]mm · 9 of 38 slices shown (2 of 2)]
[im 1/38]
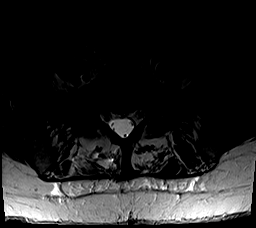
[im 6/38]
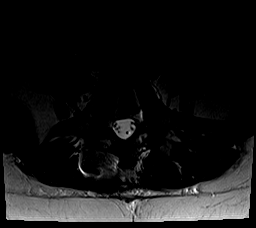
[im 11/38]
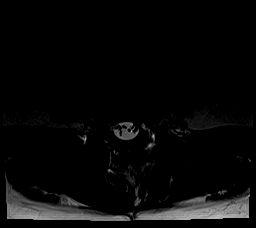
[im 16/38]
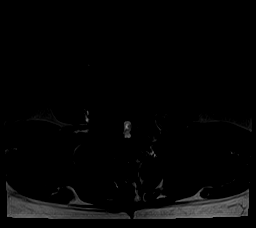
[im 19/38]
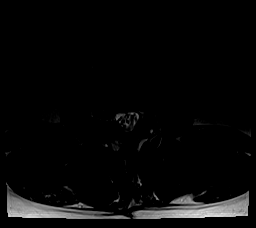
[im 22/38]
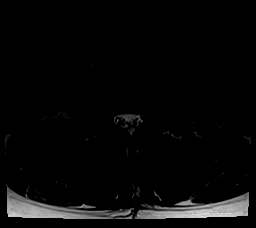
[im 27/38]
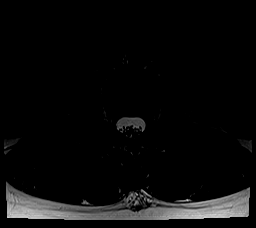
[im 32/38]
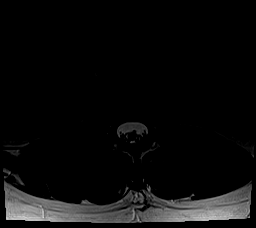
[im 38/38]
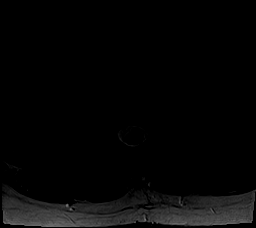

[Series 7: T1 · axial · 4.0mm · 0.35mm/px · z∈[-115,+63]mm · 5 of 38 slices shown (2 of 2)]
[im 1/38]
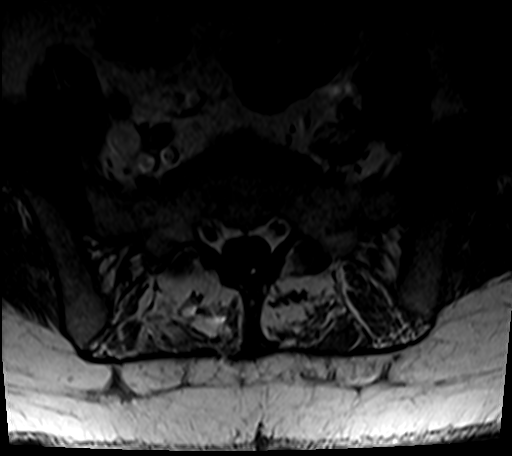
[im 6/38]
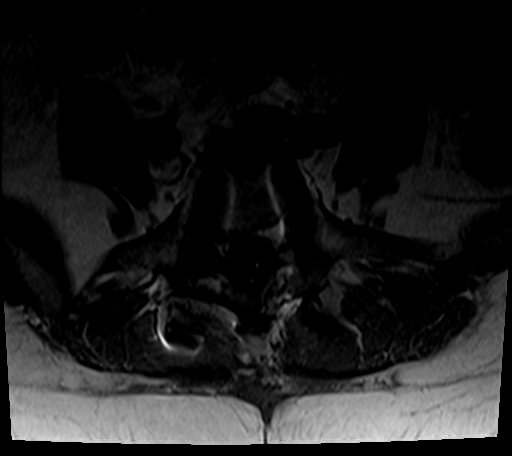
[im 11/38]
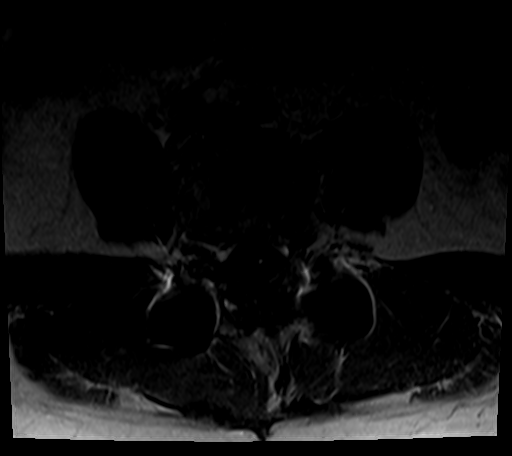
[im 19/38]
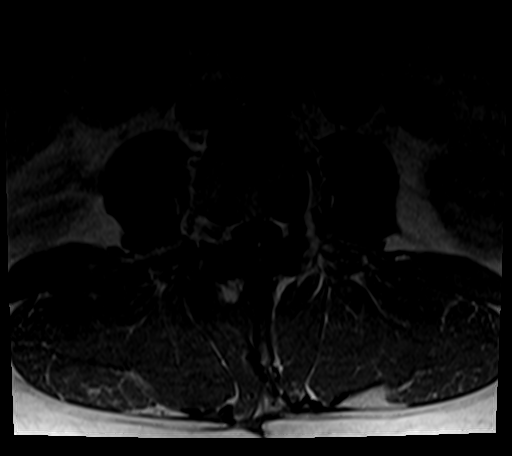
[im 32/38]
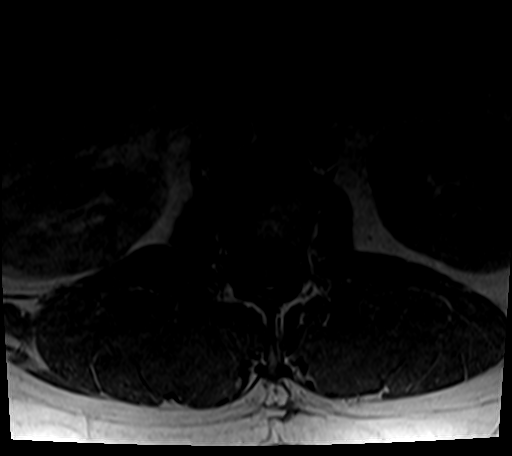

[26 of 48 positions shown; findings below may reference images not displayed]

FINDINGS: Segmentation:  5 lumbar type vertebrae as previously numbered.

Alignment:  Mild dextrocurvature and loss of lumbar lordosis

Vertebrae:  No fracture, evidence of discitis, or bone lesion.

Conus medullaris and cauda equina: Conus extends to the L1 level.
Conus and cauda equina appear normal. Thin fat deposition in the
filum terminalis, incidental.

Paraspinal and other soft tissues: Postoperative scarring and fatty
muscular atrophy.

Disc levels:

T12- L1: Mild narrowing of the disc.  No impingement

L1-L2: Minor bulging of the disc

L2-L3: Mild disc bulging without impingement

L3-L4: Disc narrowing and bulging. Facet osteoarthritis with
ligamentous thickening and changes of interval decompression.
Posterior midline cyst measuring 13 x 6 mm, likely arising from the
left facet given the apparent continuity of the cyst and the medial
left facet. Severe thecal sac compression. Mild bilateral foraminal
narrowing

L4-L5: Posterior decompression and PLIF with solid arthrodesis

L5-S1:Disc narrowing and bulging with downward pointing protrusion
that has regressed based on axial images. Changes of interval right
laminotomy. There is right S1 nerve root compression.
IMPRESSION: 1. L3-4 severe spinal stenosis primarily from a posterior epidural
cyst which is likely arising from the left facet.
2. L5-S1 right S1 impingement from disc protrusion. Moderate right
foraminal narrowing at this level.
3. L4-5 solid arthrodesis.

## 2021-02-27 ENCOUNTER — Other Ambulatory Visit: Payer: Medicaid Other

## 2021-03-07 ENCOUNTER — Other Ambulatory Visit: Payer: Self-pay | Admitting: Neurological Surgery

## 2021-03-13 NOTE — Progress Notes (Signed)
Surgical Instructions    Your procedure is scheduled on Friday September 2nd.  Report to [facility] Main Entrance "A" at 5:30 A.M., then check in with the Admitting office.  Call this number if you have problems the morning of surgery:  [phone]   If you have any questions prior to your surgery date call [phone]: Open Monday-Friday 8am-4pm    Remember:  Do not eat or drink anything after midnight the night before your surgery   Take these medicines the morning of surgery with A SIP OF WATER  gabapentin (NEURONTIN) 600 MG tablet hydrOXYzine (VISTARIL) 50 MG capsule  IF NEEDED fluticasone (FLONASE) 50 MCG/ACT nasal spray methocarbamol (ROBAXIN) 500 MG tablet Oxycodone HCl 10 MG TABS PROVENTIL HFA 108 (90 Base) MCG/ACT inhaler please bring with you to the hospital tetrahydrozoline-zinc (VISINE-AC) 0.05-0.25 % ophthalmic solution  As of today, STOP taking any Aspirin (unless otherwise instructed by your surgeon) Aleve, Naproxen, Ibuprofen, Motrin, Advil, Goody's, BC's, all herbal medications, fish oil, and all vitamins.          Do not wear jewelry or makeup Do not wear lotions, powders, perfumes, or deodorant. Do not shave 48 hours prior to surgery.   Do not bring valuables to the hospital. DO Not wear nail polish, gel polish, artificial nails, or any other type of covering on  natural nails including finger and toenails. If patients have artificial nails, gel coating, etc. that need to be removed by a nail salon please have this removed prior to surgery or surgery may need to be canceled/delayed if the surgeon/ anesthesia feels like the patient is unable to be adequately monitored.             [place] is not responsible for any belongings or valuables.  Do NOT Smoke (Tobacco/Vaping) or drink Alcohol 24 hours prior to your procedure If you use a CPAP at night, you may bring all equipment for your overnight stay.   Contacts, glasses, dentures or bridgework may not be  worn into surgery, please bring cases for these belongings   For patients admitted to the hospital, discharge time will be determined by your treatment team.   Patients discharged the day of surgery will not be allowed to drive home, and someone needs to stay with them for 24 hours.  ONLY 1 SUPPORT PERSON MAY BE PRESENT WHILE YOU ARE IN SURGERY. IF YOU ARE TO BE ADMITTED ONCE YOU ARE IN YOUR ROOM YOU WILL BE ALLOWED TWO (2) VISITORS.  Minor children may have two parents present. Special consideration for safety and communication needs will be reviewed on a case by case basis.  Special instructions:    Oral Hygiene is also important to reduce your risk of infection.  Remember - BRUSH YOUR TEETH THE MORNING OF SURGERY WITH YOUR REGULAR TOOTHPASTE   - Preparing For Surgery  Before surgery, you can play an important role. Because skin is not sterile, your skin needs to be as free of germs as possible. You can reduce the number of germs on your skin by washing with CHG (chlorahexidine gluconate) Soap before surgery.  CHG is an antiseptic cleaner which kills germs and bonds with the skin to continue killing germs even after washing.     Please do not use if you have an allergy to CHG or antibacterial soaps. If your skin becomes reddened/irritated stop using the CHG.  Do not shave (including legs and underarms) for at least 48 hours prior to first CHG shower. It  is OK to shave your face.  Please follow these instructions carefully.     Shower the NIGHT BEFORE SURGERY and the MORNING OF SURGERY with CHG Soap.   If you chose to wash your hair, wash your hair first as usual with your normal shampoo. After you shampoo, rinse your hair and body thoroughly to remove the shampoo.  Then [employer] and genitals (private parts) with your normal soap and rinse thoroughly to remove soap.  After that Use CHG Soap as you would any other liquid soap. You can apply CHG directly to the skin and wash  gently with a scrungie or a clean washcloth.   Apply the CHG Soap to your body ONLY FROM THE NECK DOWN.  Do not use on open wounds or open sores. Avoid contact with your eyes, ears, mouth and genitals (private parts). Wash Face and genitals (private parts)  with your normal soap.   Wash thoroughly, paying special attention to the area where your surgery will be performed.  Thoroughly rinse your body with warm water from the neck down.  DO NOT shower/wash with your normal soap after using and rinsing off the CHG Soap.  Pat yourself dry with a CLEAN TOWEL.  Wear CLEAN PAJAMAS to bed the night before surgery  Place CLEAN SHEETS on your bed the night before your surgery  DO NOT SLEEP WITH PETS.   Day of Surgery:  Take a shower with CHG soap. Wear Clean/Comfortable clothing the morning of surgery Do not apply any deodorants/lotions.   Remember to brush your teeth WITH YOUR REGULAR TOOTHPASTE.   Please read over the following fact sheets that you were given.

## 2021-03-14 ENCOUNTER — Other Ambulatory Visit: Payer: Self-pay

## 2021-03-14 ENCOUNTER — Encounter (HOSPITAL_COMMUNITY): Payer: Self-pay

## 2021-03-14 ENCOUNTER — Encounter (HOSPITAL_COMMUNITY)
Admission: RE | Admit: 2021-03-14 | Discharge: 2021-03-14 | Disposition: A | Discharge status: Home / Self Care | Payer: Medicaid Other | Source: Ambulatory Visit | Attending: Neurological Surgery | Admitting: Neurological Surgery

## 2021-03-14 DIAGNOSIS — Z01818 Encounter for other preprocedural examination: Secondary | ICD-10-CM | POA: Diagnosis present

## 2021-03-14 LAB — SURGICAL PCR SCREEN
MRSA, PCR: NEGATIVE
Staphylococcus aureus: NEGATIVE

## 2021-03-14 LAB — BASIC METABOLIC PANEL
Anion gap: 8 (ref 5–15)
BUN: 8 mg/dL (ref 6–20)
CO2: 28 mmol/L (ref 22–32)
Calcium: 9.2 mg/dL (ref 8.9–10.3)
Chloride: 102 mmol/L (ref 98–111)
Creatinine, Ser: 0.86 mg/dL (ref 0.44–1.00)
GFR, Estimated: >60 mL/min (ref >60)
Glucose, Bld: 99 mg/dL (ref 70–99)
Potassium: 3.6 mmol/L (ref 3.5–5.1)
Sodium: 138 mmol/L (ref 135–145)

## 2021-03-14 LAB — CBC
HCT: 49.8 % — ABNORMAL HIGH (ref 36.0–46.0)
Hemoglobin: 16.6 g/dL — ABNORMAL HIGH (ref 12.0–15.0)
MCH: 32.5 pg (ref 26.0–34.0)
MCHC: 33.3 g/dL (ref 30.0–36.0)
MCV: 97.6 fL (ref 80.0–100.0)
Platelets: 234 10*3/uL (ref 150–400)
RBC: 5.1 MIL/uL (ref 3.87–5.11)
RDW: 13.3 % (ref 11.5–15.5)
WBC: 15.7 10*3/uL — ABNORMAL HIGH (ref 4.0–10.5)
nRBC: 0 % (ref 0.0–0.2)

## 2021-03-14 LAB — TYPE AND SCREEN
ABO/RH(D): A POS
Antibody Screen: NEGATIVE

## 2021-03-14 MED ORDER — CHLORHEXIDINE GLUCONATE 0.12 % MT SOLN: 15.0000 mL | Freq: Once | OROMUCOSAL | Status: DC

## 2021-03-14 NOTE — Progress Notes (Signed)
PCP - Lauretta Grill, PA [facility]  Chest x-ray - Not indicated EKG - 03/14/21 Stress Test - Yes saw Dr. Antoine Poche no further f/u needed ECHO - 30 years ago at [facility], Dr. Valrie Hart has since passed on Cardiac Cath - Denies  Sleep Study - Denies  DM - Denies  Aspirin Instructions: Per surgeons instruction has stopped  COVID TEST- August 30th   Anesthesia review: No  Patient denies  fever, cough and chest pain at PAT appointment. Does have shortness of breath on exertion at baseline.    All instructions explained to the patient, with a verbal understanding of the material. Patient agrees to go over the instructions while at home for a better understanding. Patient also instructed to wear a mask while in public after being tested for COVID-19. The opportunity to ask questions was provided.

## 2021-03-20 ENCOUNTER — Other Ambulatory Visit: Payer: Self-pay | Admitting: Neurological Surgery

## 2021-03-21 LAB — SARS CORONAVIRUS 2 (TAT 6-24 HRS): SARS Coronavirus 2: NEGATIVE

## 2021-03-23 ENCOUNTER — Inpatient Hospital Stay (HOSPITAL_COMMUNITY)
Admission: RE | Admit: 2021-03-23 | Discharge: 2021-03-24 | DRG: 460 | Disposition: A | Discharge status: Home / Self Care | Payer: Medicaid Other | Attending: Neurological Surgery | Admitting: Neurological Surgery

## 2021-03-23 ENCOUNTER — Inpatient Hospital Stay (HOSPITAL_COMMUNITY): Payer: Medicaid Other

## 2021-03-23 ENCOUNTER — Inpatient Hospital Stay (HOSPITAL_COMMUNITY): Payer: Medicaid Other | Admitting: Certified Registered"

## 2021-03-23 ENCOUNTER — Encounter (HOSPITAL_COMMUNITY)
Admission: RE | Disposition: A | Discharge status: Home / Self Care | Payer: Self-pay | Source: Home / Self Care | Attending: Neurological Surgery

## 2021-03-23 ENCOUNTER — Encounter (HOSPITAL_COMMUNITY): Payer: Self-pay | Admitting: Neurological Surgery

## 2021-03-23 ENCOUNTER — Other Ambulatory Visit: Payer: Self-pay

## 2021-03-23 DIAGNOSIS — J449 Chronic obstructive pulmonary disease, unspecified: Secondary | ICD-10-CM | POA: Diagnosis present

## 2021-03-23 DIAGNOSIS — Z981 Arthrodesis status: Secondary | ICD-10-CM | POA: Diagnosis not present

## 2021-03-23 DIAGNOSIS — Z83438 Family history of other disorder of lipoprotein metabolism and other lipidemia: Secondary | ICD-10-CM | POA: Diagnosis not present

## 2021-03-23 DIAGNOSIS — Z833 Family history of diabetes mellitus: Secondary | ICD-10-CM | POA: Diagnosis not present

## 2021-03-23 DIAGNOSIS — M48061 Spinal stenosis, lumbar region without neurogenic claudication: Secondary | ICD-10-CM | POA: Diagnosis present

## 2021-03-23 DIAGNOSIS — Z8249 Family history of ischemic heart disease and other diseases of the circulatory system: Secondary | ICD-10-CM | POA: Diagnosis not present

## 2021-03-23 DIAGNOSIS — Z8261 Family history of arthritis: Secondary | ICD-10-CM

## 2021-03-23 DIAGNOSIS — I10 Essential (primary) hypertension: Secondary | ICD-10-CM | POA: Diagnosis present

## 2021-03-23 DIAGNOSIS — Z818 Family history of other mental and behavioral disorders: Secondary | ICD-10-CM | POA: Diagnosis not present

## 2021-03-23 DIAGNOSIS — F17210 Nicotine dependence, cigarettes, uncomplicated: Secondary | ICD-10-CM | POA: Diagnosis present

## 2021-03-23 DIAGNOSIS — Z888 Allergy status to other drugs, medicaments and biological substances status: Secondary | ICD-10-CM

## 2021-03-23 DIAGNOSIS — M797 Fibromyalgia: Secondary | ICD-10-CM | POA: Diagnosis present

## 2021-03-23 DIAGNOSIS — Z885 Allergy status to narcotic agent status: Secondary | ICD-10-CM

## 2021-03-23 DIAGNOSIS — Z79899 Other long term (current) drug therapy: Secondary | ICD-10-CM | POA: Diagnosis not present

## 2021-03-23 DIAGNOSIS — Z882 Allergy status to sulfonamides status: Secondary | ICD-10-CM

## 2021-03-23 DIAGNOSIS — Z886 Allergy status to analgesic agent status: Secondary | ICD-10-CM

## 2021-03-23 DIAGNOSIS — Z419 Encounter for procedure for purposes other than remedying health state, unspecified: Secondary | ICD-10-CM

## 2021-03-23 DIAGNOSIS — F32A Depression, unspecified: Secondary | ICD-10-CM | POA: Diagnosis present

## 2021-03-23 DIAGNOSIS — M7138 Other bursal cyst, other site: Secondary | ICD-10-CM | POA: Diagnosis present

## 2021-03-23 DIAGNOSIS — M4726 Other spondylosis with radiculopathy, lumbar region: Principal | ICD-10-CM | POA: Diagnosis present

## 2021-03-23 DIAGNOSIS — G4700 Insomnia, unspecified: Secondary | ICD-10-CM | POA: Diagnosis present

## 2021-03-23 DIAGNOSIS — M47816 Spondylosis without myelopathy or radiculopathy, lumbar region: Secondary | ICD-10-CM | POA: Diagnosis present

## 2021-03-23 DIAGNOSIS — Z825 Family history of asthma and other chronic lower respiratory diseases: Secondary | ICD-10-CM

## 2021-03-23 DIAGNOSIS — M545 Low back pain, unspecified: Secondary | ICD-10-CM | POA: Diagnosis present

## 2021-03-23 HISTORY — PX: TRANSFORAMINAL LUMBAR INTERBODY FUSION W/ MIS 1 LEVEL: SHX6145

## 2021-03-23 IMAGING — RF DG LUMBAR SPINE 2-3V
1 series · 2 of 2 positions shown · non-contrast
Comparison: MRI [DATE].  Radiographs [DATE].

CLINICAL DATA: Minimally invasive TLIF L3-4 with revision of
hardware at L4-5.

EXAM:
LUMBAR SPINE - 2-3 VIEW; DG C-ARM 1-60 MIN-NO REPORT

[Series 1: run · 2 of 2 slices shown]
[im 1/2]
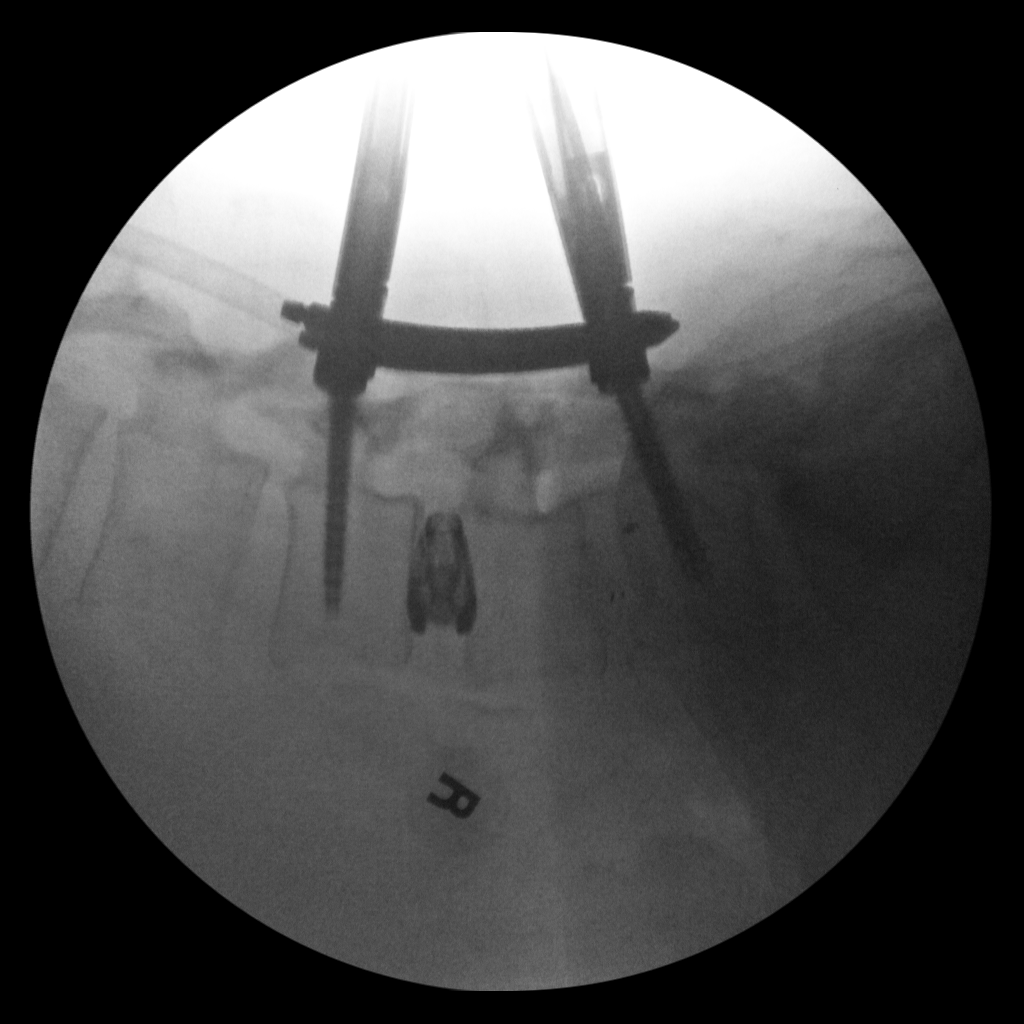
[im 2/2]
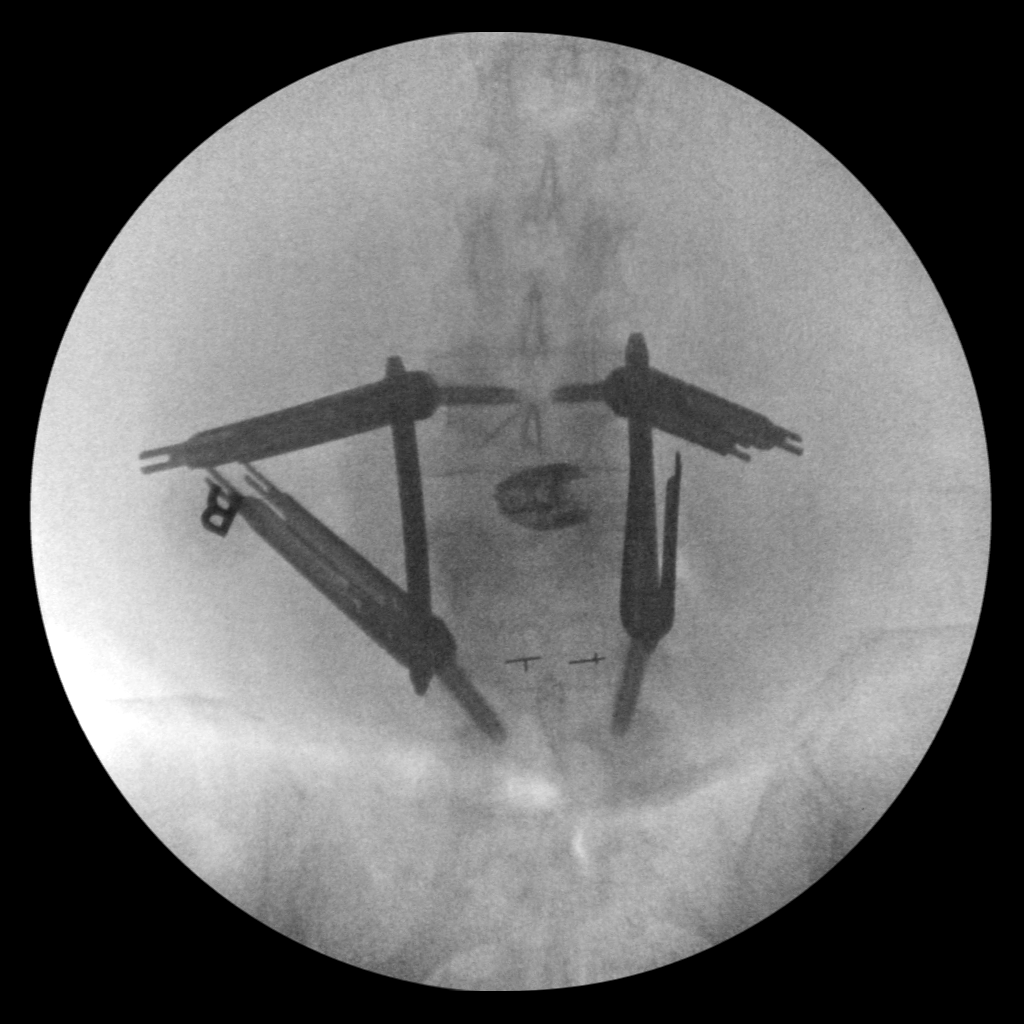

[2 of 2 positions shown; findings below may reference images not displayed]

FINDINGS: C-arm fluoroscopy was provided in the operating [HOSPITAL] minutes and
22 seconds of fluoroscopy time. 105.12 mGy.

PA and lateral spot fluoroscopic images of the lumbar spine are
submitted postoperatively. These images demonstrate the removal of
the bilateral L4 pedicle screws, placement of bilateral L3 pedicle
screws and placement of an interbody spacer at L3-4. There are new
posterior interconnecting rods. No complications identified.
IMPRESSION: Intraoperative views during revision of lumbar fusion, now extending
from L3 through L5. No demonstrated complication.

## 2021-03-23 SURGERY — MINIMALLY INVASIVE (MIS) TRANSFORAMINAL LUMBAR INTERBODY FUSION (TLIF) 1 LEVEL
Anesthesia: General | Laterality: Right

## 2021-03-23 MED ORDER — CLONIDINE HCL 0.2 MG PO TABS
0.2000 mg | ORAL_TABLET | Freq: Every day | ORAL | Status: DC
  Administered 2021-03-23: 0.2 mg via ORAL
  Filled 2021-03-23: qty 1

## 2021-03-23 MED ORDER — THROMBIN 5000 UNITS EX SOLR
CUTANEOUS | Status: AC
  Filled 2021-03-23: qty 5000

## 2021-03-23 MED ORDER — HYDROMORPHONE HCL 1 MG/ML IJ SOLN
INTRAMUSCULAR | Status: AC
  Filled 2021-03-23: qty 1

## 2021-03-23 MED ORDER — ZOLPIDEM TARTRATE 5 MG PO TABS: 5.0000 mg | ORAL_TABLET | Freq: Every evening | ORAL | Status: DC | PRN

## 2021-03-23 MED ORDER — ROCURONIUM 10MG/ML (10ML) SYRINGE FOR MEDFUSION PUMP - OPTIME
INTRAVENOUS | Status: DC | PRN
  Administered 2021-03-23: 100 mg via INTRAVENOUS

## 2021-03-23 MED ORDER — CHLORHEXIDINE GLUCONATE 0.12 % MT SOLN
OROMUCOSAL | Status: AC
  Filled 2021-03-23: qty 15

## 2021-03-23 MED ORDER — LIDOCAINE HCL (CARDIAC) PF 100 MG/5ML IV SOSY
PREFILLED_SYRINGE | INTRAVENOUS | Status: DC | PRN
  Administered 2021-03-23: 100 mg via INTRAVENOUS

## 2021-03-23 MED ORDER — METHOCARBAMOL 1000 MG/10ML IJ SOLN
500.0000 mg | Freq: Four times a day (QID) | INTRAVENOUS | Status: DC | PRN
  Filled 2021-03-23: qty 5

## 2021-03-23 MED ORDER — HYDROXYZINE HCL 25 MG PO TABS
50.0000 mg | ORAL_TABLET | Freq: Three times a day (TID) | ORAL | Status: DC
  Administered 2021-03-23 – 2021-03-24 (×3): 50 mg via ORAL
  Filled 2021-03-23 (×3): qty 2

## 2021-03-23 MED ORDER — LISINOPRIL 20 MG PO TABS
20.0000 mg | ORAL_TABLET | Freq: Every day | ORAL | Status: DC
  Administered 2021-03-23: 20 mg via ORAL
  Filled 2021-03-23: qty 1

## 2021-03-23 MED ORDER — 0.9 % SODIUM CHLORIDE (POUR BTL) OPTIME
TOPICAL | Status: DC | PRN
  Administered 2021-03-23: 1000 mL

## 2021-03-23 MED ORDER — ALBUTEROL SULFATE (2.5 MG/3ML) 0.083% IN NEBU: 3.0000 mL | INHALATION_SOLUTION | Freq: Four times a day (QID) | RESPIRATORY_TRACT | Status: DC | PRN

## 2021-03-23 MED ORDER — THROMBIN 20000 UNITS EX SOLR
CUTANEOUS | Status: AC
  Filled 2021-03-23: qty 20000

## 2021-03-23 MED ORDER — ALUM & MAG HYDROXIDE-SIMETH 200-200-20 MG/5ML PO SUSP
30.0000 mL | Freq: Four times a day (QID) | ORAL | Status: DC | PRN
  Filled 2021-03-23: qty 30

## 2021-03-23 MED ORDER — ONDANSETRON HCL 4 MG/2ML IJ SOLN
INTRAMUSCULAR | Status: DC | PRN
  Administered 2021-03-23 (×2): 4 mg via INTRAVENOUS

## 2021-03-23 MED ORDER — CHLORHEXIDINE GLUCONATE CLOTH 2 % EX PADS: 6.0000 | MEDICATED_PAD | Freq: Once | CUTANEOUS | Status: DC

## 2021-03-23 MED ORDER — SCOPOLAMINE 1 MG/3DAYS TD PT72
MEDICATED_PATCH | TRANSDERMAL | Status: AC
  Filled 2021-03-23: qty 1

## 2021-03-23 MED ORDER — OXYCODONE HCL 5 MG PO TABS
10.0000 mg | ORAL_TABLET | ORAL | Status: DC | PRN
Start: 2021-03-23 — End: 2021-03-24
  Administered 2021-03-23 – 2021-03-24 (×5): 10 mg via ORAL
  Filled 2021-03-23 (×5): qty 2

## 2021-03-23 MED ORDER — BUPIVACAINE LIPOSOME 1.3 % IJ SUSP
INTRAMUSCULAR | Status: AC
  Filled 2021-03-23: qty 20

## 2021-03-23 MED ORDER — ONDANSETRON HCL 4 MG/2ML IJ SOLN
INTRAMUSCULAR | Status: AC
  Filled 2021-03-23: qty 2

## 2021-03-23 MED ORDER — OXYCODONE HCL 5 MG PO TABS
ORAL_TABLET | ORAL | Status: AC
  Filled 2021-03-23: qty 1

## 2021-03-23 MED ORDER — SENNOSIDES-DOCUSATE SODIUM 8.6-50 MG PO TABS: 1.0000 | ORAL_TABLET | Freq: Every evening | ORAL | Status: DC | PRN

## 2021-03-23 MED ORDER — SUFENTANIL CITRATE 50 MCG/ML IV SOLN
INTRAVENOUS | Status: DC | PRN
  Administered 2021-03-23 (×5): 10 ug via INTRAVENOUS

## 2021-03-23 MED ORDER — VANCOMYCIN HCL IN DEXTROSE 1-5 GM/200ML-% IV SOLN: 1000.0000 mg | INTRAVENOUS | Status: AC

## 2021-03-23 MED ORDER — ACETAMINOPHEN 10 MG/ML IV SOLN
INTRAVENOUS | Status: DC | PRN
  Administered 2021-03-23: 1000 mg via INTRAVENOUS

## 2021-03-23 MED ORDER — SODIUM CHLORIDE 0.9% FLUSH
3.0000 mL | Freq: Two times a day (BID) | INTRAVENOUS | Status: DC
  Administered 2021-03-23 (×2): 3 mL via INTRAVENOUS

## 2021-03-23 MED ORDER — SODIUM CHLORIDE 0.9% FLUSH: 3.0000 mL | INTRAVENOUS | Status: DC | PRN

## 2021-03-23 MED ORDER — BUPIVACAINE HCL (PF) 0.5 % IJ SOLN
INTRAMUSCULAR | Status: AC
  Filled 2021-03-23: qty 30

## 2021-03-23 MED ORDER — SODIUM CHLORIDE (PF) 0.9 % IJ SOLN
INTRAMUSCULAR | Status: AC
  Filled 2021-03-23: qty 10

## 2021-03-23 MED ORDER — OXYCODONE HCL 5 MG PO TABS
5.0000 mg | ORAL_TABLET | Freq: Once | ORAL | Status: AC | PRN
  Administered 2021-03-23: 5 mg via ORAL

## 2021-03-23 MED ORDER — PHENYLEPHRINE HCL-NACL 20-0.9 MG/250ML-% IV SOLN
INTRAVENOUS | Status: DC | PRN
  Administered 2021-03-23: 20 ug/min via INTRAVENOUS

## 2021-03-23 MED ORDER — DEXAMETHASONE SODIUM PHOSPHATE 10 MG/ML IJ SOLN
INTRAMUSCULAR | Status: DC | PRN
  Administered 2021-03-23: 10 mg via INTRAVENOUS

## 2021-03-23 MED ORDER — BUPIVACAINE LIPOSOME 1.3 % IJ SUSP
INTRAMUSCULAR | Status: DC | PRN
  Administered 2021-03-23: 20 mL

## 2021-03-23 MED ORDER — OXYCODONE HCL 5 MG/5ML PO SOLN
5.0000 mg | Freq: Once | ORAL | Status: AC | PRN
Start: 2021-03-23 — End: 2021-03-23

## 2021-03-23 MED ORDER — PHENYLEPHRINE HCL (PRESSORS) 10 MG/ML IV SOLN
INTRAVENOUS | Status: DC | PRN
  Administered 2021-03-23: 80 ug via INTRAVENOUS

## 2021-03-23 MED ORDER — PHENYLEPHRINE 40 MCG/ML (10ML) SYRINGE FOR IV PUSH (FOR BLOOD PRESSURE SUPPORT)
PREFILLED_SYRINGE | INTRAVENOUS | Status: AC
  Filled 2021-03-23: qty 10

## 2021-03-23 MED ORDER — ORAL CARE MOUTH RINSE
15.0000 mL | Freq: Once | OROMUCOSAL | Status: AC
  Administered 2021-03-23: 15 mL via OROMUCOSAL

## 2021-03-23 MED ORDER — LACTATED RINGERS IV SOLN: INTRAVENOUS | Status: DC | PRN

## 2021-03-23 MED ORDER — FLUOXETINE HCL 20 MG PO CAPS
20.0000 mg | ORAL_CAPSULE | Freq: Every day | ORAL | Status: DC
  Administered 2021-03-23: 20 mg via ORAL
  Filled 2021-03-23 (×2): qty 1

## 2021-03-23 MED ORDER — ONDANSETRON HCL 4 MG PO TABS: 4.0000 mg | ORAL_TABLET | Freq: Four times a day (QID) | ORAL | Status: DC | PRN

## 2021-03-23 MED ORDER — HYDROMORPHONE HCL 1 MG/ML IJ SOLN: 1.0000 mg | INTRAMUSCULAR | Status: DC | PRN

## 2021-03-23 MED ORDER — SODIUM CHLORIDE 0.9 % IV SOLN: 250.0000 mL | INTRAVENOUS | Status: DC

## 2021-03-23 MED ORDER — MIDAZOLAM HCL 2 MG/2ML IJ SOLN
INTRAMUSCULAR | Status: AC
  Filled 2021-03-23: qty 2

## 2021-03-23 MED ORDER — THROMBIN 20000 UNITS EX SOLR
CUTANEOUS | Status: DC | PRN
  Administered 2021-03-23: 20 mL via TOPICAL

## 2021-03-23 MED ORDER — ACETAMINOPHEN 650 MG RE SUPP: 650.0000 mg | RECTAL | Status: DC | PRN

## 2021-03-23 MED ORDER — HYDROMORPHONE HCL 1 MG/ML IJ SOLN
0.2500 mg | INTRAMUSCULAR | Status: DC | PRN
  Administered 2021-03-23 (×4): 0.5 mg via INTRAVENOUS

## 2021-03-23 MED ORDER — ONDANSETRON HCL 4 MG/2ML IJ SOLN
4.0000 mg | Freq: Once | INTRAMUSCULAR | Status: AC | PRN
  Administered 2021-03-23: 4 mg via INTRAVENOUS

## 2021-03-23 MED ORDER — GABAPENTIN 600 MG PO TABS
600.0000 mg | ORAL_TABLET | Freq: Three times a day (TID) | ORAL | Status: DC
  Administered 2021-03-23 – 2021-03-24 (×3): 600 mg via ORAL
  Filled 2021-03-23 (×3): qty 1

## 2021-03-23 MED ORDER — PROPOFOL 10 MG/ML IV BOLUS
INTRAVENOUS | Status: DC | PRN
  Administered 2021-03-23: 150 mg via INTRAVENOUS
  Administered 2021-03-23: 20 mg via INTRAVENOUS

## 2021-03-23 MED ORDER — DROPERIDOL 2.5 MG/ML IJ SOLN
INTRAMUSCULAR | Status: AC
  Filled 2021-03-23: qty 2

## 2021-03-23 MED ORDER — CEFAZOLIN SODIUM-DEXTROSE 2-4 GM/100ML-% IV SOLN
2.0000 g | Freq: Three times a day (TID) | INTRAVENOUS | Status: AC
  Administered 2021-03-23 – 2021-03-24 (×2): 2 g via INTRAVENOUS
  Filled 2021-03-23 (×2): qty 100

## 2021-03-23 MED ORDER — FLUTICASONE PROPIONATE 50 MCG/ACT NA SUSP
1.0000 | Freq: Every day | NASAL | Status: DC | PRN
  Filled 2021-03-23: qty 16

## 2021-03-23 MED ORDER — SUGAMMADEX SODIUM 200 MG/2ML IV SOLN
INTRAVENOUS | Status: DC | PRN
  Administered 2021-03-23: 200 mg via INTRAVENOUS

## 2021-03-23 MED ORDER — THROMBIN 5000 UNITS EX SOLR
OROMUCOSAL | Status: DC | PRN
  Administered 2021-03-23 (×2): 5 mL via TOPICAL

## 2021-03-23 MED ORDER — PROPOFOL 10 MG/ML IV BOLUS
INTRAVENOUS | Status: AC
  Filled 2021-03-23: qty 20

## 2021-03-23 MED ORDER — METHOCARBAMOL 750 MG PO TABS
750.0000 mg | ORAL_TABLET | Freq: Four times a day (QID) | ORAL | Status: DC | PRN
  Administered 2021-03-23 – 2021-03-24 (×3): 750 mg via ORAL
  Filled 2021-03-23 (×3): qty 1

## 2021-03-23 MED ORDER — LIDOCAINE 2% (20 MG/ML) 5 ML SYRINGE
INTRAMUSCULAR | Status: AC
  Filled 2021-03-23: qty 5

## 2021-03-23 MED ORDER — LACTATED RINGERS IV SOLN: INTRAVENOUS | Status: DC

## 2021-03-23 MED ORDER — ACETAMINOPHEN 325 MG PO TABS: 650.0000 mg | ORAL_TABLET | ORAL | Status: DC | PRN

## 2021-03-23 MED ORDER — SCOPOLAMINE 1 MG/3DAYS TD PT72
MEDICATED_PATCH | TRANSDERMAL | Status: DC | PRN
  Administered 2021-03-23: 1 via TRANSDERMAL

## 2021-03-23 MED ORDER — VANCOMYCIN HCL IN DEXTROSE 1-5 GM/200ML-% IV SOLN
INTRAVENOUS | Status: AC
  Administered 2021-03-23: 1000 mg via INTRAVENOUS
  Filled 2021-03-23: qty 200

## 2021-03-23 MED ORDER — ONDANSETRON HCL 4 MG/2ML IJ SOLN: 4.0000 mg | Freq: Four times a day (QID) | INTRAMUSCULAR | Status: DC | PRN

## 2021-03-23 MED ORDER — SUFENTANIL CITRATE 50 MCG/ML IV SOLN
INTRAVENOUS | Status: AC
  Filled 2021-03-23: qty 1

## 2021-03-23 MED ORDER — DROPERIDOL 2.5 MG/ML IJ SOLN
INTRAMUSCULAR | Status: DC | PRN
  Administered 2021-03-23: .625 mg via INTRAVENOUS

## 2021-03-23 MED ORDER — DEXAMETHASONE SODIUM PHOSPHATE 10 MG/ML IJ SOLN
INTRAMUSCULAR | Status: AC
  Filled 2021-03-23: qty 1

## 2021-03-23 MED ORDER — ROCURONIUM BROMIDE 10 MG/ML (PF) SYRINGE
PREFILLED_SYRINGE | INTRAVENOUS | Status: AC
  Filled 2021-03-23: qty 10

## 2021-03-23 MED ORDER — MIDAZOLAM HCL 2 MG/2ML IJ SOLN
INTRAMUSCULAR | Status: DC | PRN
  Administered 2021-03-23: 2 mg via INTRAVENOUS

## 2021-03-23 MED ORDER — OXYCODONE HCL 5 MG PO TABS
10.0000 mg | ORAL_TABLET | Freq: Three times a day (TID) | ORAL | Status: DC | PRN
Start: 2021-03-23 — End: 2021-03-24

## 2021-03-23 MED ORDER — NAPHAZOLINE-GLYCERIN 0.012-0.25 % OP SOLN
1.0000 [drp] | Freq: Four times a day (QID) | OPHTHALMIC | Status: DC | PRN
  Filled 2021-03-23: qty 15

## 2021-03-23 SURGICAL SUPPLY — 87 items
ADH SKN CLS APL DERMABOND .7 (GAUZE/BANDAGES/DRESSINGS) ×1
BAG BANDED W/RUBBER/TAPE 36X54 (MISCELLANEOUS) ×4 IMPLANT
BAG COUNTER SPONGE SURGICOUNT (BAG) ×2 IMPLANT
BAG EQP BAND 135X91 W/RBR TAPE (MISCELLANEOUS) ×2
BAG SPNG CNTER NS LX DISP (BAG) ×1
BAND INSRT 18 STRL LF DISP RB (MISCELLANEOUS) ×2
BAND RUBBER #18 3X1/16 STRL (MISCELLANEOUS) ×4 IMPLANT
BASKET BONE COLLECTION (BASKET) ×1 IMPLANT
BLADE BN FN 3.2XSTRL LF (MISCELLANEOUS) IMPLANT
BLADE BONE MILL FINE (MISCELLANEOUS) ×2
BUR 2.5 MTCH HD 16 (BUR) ×3 IMPLANT
BUR MATCHSTICK NEURO 3.0 LAGG (BURR) ×2 IMPLANT
CAGE EXP CATALYFT 9 (Plate) ×1 IMPLANT
CARTRIDGE OIL MAESTRO DRILL (MISCELLANEOUS) ×1 IMPLANT
CNTNR URN SCR LID CUP LEK RST (MISCELLANEOUS) ×1 IMPLANT
CONT SPEC 4OZ STRL OR WHT (MISCELLANEOUS) ×4
COVER BACK TABLE 60X90IN (DRAPES) ×2 IMPLANT
COVER MAYO STAND STRL (DRAPES) ×2 IMPLANT
DECANTER SPIKE VIAL GLASS SM (MISCELLANEOUS) ×1 IMPLANT
DERMABOND ADVANCED (GAUZE/BANDAGES/DRESSINGS) ×1
DERMABOND ADVANCED .7 DNX12 (GAUZE/BANDAGES/DRESSINGS) IMPLANT
DIFFUSER DRILL AIR PNEUMATIC (MISCELLANEOUS) ×1 IMPLANT
DRAIN JACKSON RD 7FR 3/32 (WOUND CARE) IMPLANT
DRAPE C-ARMOR (DRAPES) IMPLANT
DRAPE LAPAROTOMY 100X72X124 (DRAPES) ×2 IMPLANT
DRAPE MICROSCOPE LEICA (MISCELLANEOUS) ×2 IMPLANT
DRAPE STERI IOBAN 125X83 (DRAPES) ×2 IMPLANT
DRAPE SURG 17X23 STRL (DRAPES) ×2 IMPLANT
DRSG OPSITE POSTOP 4X6 (GAUZE/BANDAGES/DRESSINGS) ×2 IMPLANT
DURAPREP 26ML APPLICATOR (WOUND CARE) ×2 IMPLANT
ELECT BLADE INSULATED 6.5IN (ELECTROSURGICAL) ×2
ELECT COATED BLADE 2.86 ST (ELECTRODE) ×1 IMPLANT
ELECT REM PT RETURN 9FT ADLT (ELECTROSURGICAL) ×2
ELECTRODE BLDE INSULATED 6.5IN (ELECTROSURGICAL) ×1 IMPLANT
ELECTRODE REM PT RTRN 9FT ADLT (ELECTROSURGICAL) ×1 IMPLANT
EXTENDER TAB GUIDE SV 5.5/6.0 (INSTRUMENTS) ×8 IMPLANT
GAUZE 4X4 16PLY ~~LOC~~+RFID DBL (SPONGE) IMPLANT
GAUZE SPONGE 4X4 12PLY STRL (GAUZE/BANDAGES/DRESSINGS) ×2 IMPLANT
GLOVE SRG 8 PF TXTR STRL LF DI (GLOVE) ×2 IMPLANT
GLOVE SURG LTX SZ8 (GLOVE) ×4 IMPLANT
GLOVE SURG UNDER POLY LF SZ8 (GLOVE) ×4
GOWN STRL REUS W/ TWL LRG LVL3 (GOWN DISPOSABLE) IMPLANT
GOWN STRL REUS W/ TWL XL LVL3 (GOWN DISPOSABLE) ×2 IMPLANT
GOWN STRL REUS W/TWL 2XL LVL3 (GOWN DISPOSABLE) IMPLANT
GOWN STRL REUS W/TWL LRG LVL3 (GOWN DISPOSABLE)
GOWN STRL REUS W/TWL XL LVL3 (GOWN DISPOSABLE) ×4
GUIDEWIRE SHARP NITINOL 610 (WIRE) ×4 IMPLANT
HEMOSTAT POWDER KIT SURGIFOAM (HEMOSTASIS) ×4 IMPLANT
KIT BASIN OR (CUSTOM PROCEDURE TRAY) ×2 IMPLANT
KIT POSITION SURG JACKSON T1 (MISCELLANEOUS) ×2 IMPLANT
KIT TURNOVER KIT B (KITS) ×2 IMPLANT
MARKER SKIN DUAL TIP RULER LAB (MISCELLANEOUS) ×2 IMPLANT
NDL ASPIRATION RAN815N 8X15 (NEEDLE) IMPLANT
NDL HYPO 21X1.5 SAFETY (NEEDLE) IMPLANT
NDL HYPO 25X1 1.5 SAFETY (NEEDLE) ×1 IMPLANT
NDL SPNL 18GX3.5 QUINCKE PK (NEEDLE) IMPLANT
NDL SPNL 22GX3.5 QUINCKE BK (NEEDLE) IMPLANT
NEEDLE ASPIRATION RAN815N 8X15 (NEEDLE) ×1 IMPLANT
NEEDLE ASPIRATION RANFAC 8X15 (NEEDLE) ×1
NEEDLE HYPO 21X1.5 SAFETY (NEEDLE) ×2 IMPLANT
NEEDLE HYPO 25X1 1.5 SAFETY (NEEDLE) ×2 IMPLANT
NEEDLE SPNL 18GX3.5 QUINCKE PK (NEEDLE) IMPLANT
NEEDLE SPNL 22GX3.5 QUINCKE BK (NEEDLE) ×2 IMPLANT
NS IRRIG 1000ML POUR BTL (IV SOLUTION) ×2 IMPLANT
OIL CARTRIDGE MAESTRO DRILL (MISCELLANEOUS)
PACK LAMINECTOMY NEURO (CUSTOM PROCEDURE TRAY) ×2 IMPLANT
PAD ARMBOARD 7.5X6 YLW CONV (MISCELLANEOUS) ×6 IMPLANT
PATTIES SURGICAL .5 X.5 (GAUZE/BANDAGES/DRESSINGS) IMPLANT
PATTIES SURGICAL .5 X1 (DISPOSABLE) IMPLANT
PATTIES SURGICAL 1X1 (DISPOSABLE) IMPLANT
PUTTY GRAFTON DBF 6CC W/DELIVE (Putty) ×2 IMPLANT
ROD PERC CCM 5.5X65 (Rod) ×1 IMPLANT
ROD PERC CCM 5.5X70 (Rod) ×1 IMPLANT
SCREW MAS VOYAGER 5.5X50 (Screw) ×4 IMPLANT
SCREW SET 5.5/6.0MM SOLERA (Screw) ×4 IMPLANT
SCREW SPINAL IFIX 6.5X45 (Screw) ×2 IMPLANT
SPONGE T-LAP 4X18 ~~LOC~~+RFID (SPONGE) IMPLANT
STAPLER VISISTAT 35W (STAPLE) ×1 IMPLANT
SUT VIC AB 0 CT1 27 (SUTURE) ×2
SUT VIC AB 0 CT1 27XBRD ANBCTR (SUTURE) ×1 IMPLANT
SUT VIC AB 2-0 CP2 18 (SUTURE) ×4 IMPLANT
SUT VIC AB 3-0 SH 8-18 (SUTURE) ×2 IMPLANT
SYR 20ML LL LF (SYRINGE) ×2 IMPLANT
TOWEL GREEN STERILE (TOWEL DISPOSABLE) IMPLANT
TOWEL GREEN STERILE FF (TOWEL DISPOSABLE) IMPLANT
TRAY FOLEY MTR SLVR 16FR STAT (SET/KITS/TRAYS/PACK) ×2 IMPLANT
WATER STERILE IRR 1000ML POUR (IV SOLUTION) ×2 IMPLANT

## 2021-03-23 NOTE — H&P (Signed)
Providing Compassionate, Quality Care - Together  NEUROSURGERY HISTORY & PHYSICAL   Dana Holland is an 60 y.o. female.   Chief Complaint: Right lower extremity radiculopathy chronic low back pain HPI: This is a 60 year old female with a history of an L4-5 fusion, L3-4 minimally invasive decompression, resection of synovial cyst and microdiscectomy at L5-S1 that return to my office with complaints of worsening low back pain, neurogenic claudication and right greater than left lower extremity radiculopathy.  MRI revealed increase in size of synovial cyst at L3-4 with severe stenosis and adjacent segment disease to her L4-5 fusion.  We discussed all risks, benefits and expected outcomes of surgical intervention in the form of an L3-4 fusion with revision of hardware at L4-5.  She had failed conservative measures including medication management.  Past Medical History:  Diagnosis Date   Anxiety    Asthma    Bipolar disorder ([place])    takes Lamictal daily   Chronic back pain    Chronic back pain    COPD (chronic obstructive pulmonary disease) (HCC)    DDD (degenerative disc disease)    DDD (degenerative disc disease), lumbar    DDD (degenerative disc disease), lumbar    Depression    takes Prozac daily   DJD (degenerative joint disease)    Fibromyalgia    Headache    migraines   History of kidney stones    History of palpitations    "I was checked out by Dr. Percival Spanish and everything okay"   Hypertension    Insomnia    takes Ambien nightly   Lumbar stenosis with neurogenic claudication    Neck pain    Seasonal allergies    Shortness of breath dyspnea    with exertion    Past Surgical History:  Procedure Laterality Date   ABDOMINAL EXPLORATION SURGERY     ABDOMINAL HYSTERECTOMY     ANTERIOR CERVICAL DECOMP/DISCECTOMY FUSION N/A 08/03/2015   Procedure: ACDF C5-C6  C6-C7;  Surgeon: Jovita Gamma, MD;  Location: [place] NEUR[facility] ORS;  Service: Neurosurgery;  Laterality: N/A;  ACDF  C5-C6  C6-C7   BACK SURGERY     lower back   BREAST SURGERY     CARPAL TUNNEL RELEASE Right 11/29/2015   Procedure: CARPAL TUNNEL RELEASE-Right;  Surgeon: Jovita Gamma, MD;  Location: [place] [facility];  Service: Neurosurgery;  Laterality: Right;  CARPAL TUNNEL RELEASE-Right   CARPAL TUNNEL RELEASE Left 05/23/2016   Procedure: Left CARPAL TUNNEL RELEASE with Bier block;  Surgeon: Jovita Gamma, MD;  Location: [place];  Service: Neurosurgery;  Laterality: Left;  Left CARPAL TUNNEL RELEASE with Bier block   CHOLECYSTECTOMY     DILATION AND CURETTAGE OF UTERUS     KNEE SURGERY     LUMBAR LAMINECTOMY/ DECOMPRESSION WITH MET-RX N/A 06/27/2020   Procedure: MIS MICRODISCECTOMY RIGHT, LUMBAR FIVE-SACRAL ONE, LUMBAR THREE-FOUR BILATERAL MIS LAMINECTOMY;  Surgeon: Taffy Delconte, Theodoro Doing, DO;  Location: [place];  Service: Neurosurgery;  Laterality: N/A;   MULTIPLE TOOTH EXTRACTIONS     TUBAL LIGATION      Family History  Problem Relation Age of Onset   Heart disease Mother    Arthritis Mother    Hyperlipidemia Mother    Hypertension Father    Heart disease Father    Diabetes Father    Hyperlipidemia Father    Hypertension Other    Heart disease Other    Arthritis Sister    COPD Sister    Depression Sister    Hyperlipidemia Sister  Hyperlipidemia Brother    Hypertension Brother    Asthma Son    Arthritis Maternal Grandmother    Diabetes Maternal Grandmother    Heart disease Maternal Grandmother    Heart disease Maternal Grandfather    Arthritis Paternal Grandmother    Heart disease Paternal Grandmother    Heart disease Paternal Grandfather    Social History:  reports that she has been smoking cigarettes. She has a 20.00 pack-year smoking history. She has never used smokeless tobacco. She reports current drug use. Drug: Marijuana. She reports that she does not drink alcohol.  Allergies:  Allergies  Allergen Reactions   Imitrex [Sumatriptan] Other (See Comments)    Blood vessels in neck felt  like they were going to burst, potential stroke   Remeron [Mirtazapine] Other (See Comments)    Chest pain   Wellbutrin [Bupropion] Hives, Shortness Of Breath, Swelling and Rash    SWELLING REACTION UNSPECIFIED    Ibuprofen Hives, Swelling and Rash    SWELLING REACTION UNSPECIFIED    Keflex [Cephalexin] Hives and Swelling   Nsaids Hives, Swelling and Rash   Paroxetine Hcl Other (See Comments)    "FELT LIKE BUGS BITING ALL OVER"   Toradol [Ketorolac Tromethamine] Hives, Swelling and Rash   Vioxx [Rofecoxib] Hives, Swelling and Rash    SWELLING REACTION UNSPECIFIED    Bextra [Valdecoxib]     UNSPECIFIED REACTION    Buspar [Buspirone]     UNSPECIFIED REACTION    Compazine [Prochlorperazine Edisylate] Other (See Comments)    "Felt like pt was trapped, hallucinations"   Cymbalta [Duloxetine Hcl] Other (See Comments)    UNSPECIFIED REACTION    Effexor [Venlafaxine] Other (See Comments)    UNSPECIFIED REACTION    Sulfa Antibiotics Hives and Swelling   Voltaren [Diclofenac Sodium] Other (See Comments)    Severe intestinal bleeding, stomach in knots   Tramadol Other (See Comments)    Severe headache/migraine    Medications Prior to Admission  Medication Sig Dispense Refill   cloNIDine (CATAPRES) 0.2 MG tablet Take 0.2 mg by mouth at bedtime.     ergocalciferol (VITAMIN D2) 1.25 MG (50000 UT) capsule Take 50,000 Units by mouth every Wednesday.     FLUoxetine (PROZAC) 20 MG tablet Take 20 mg by mouth daily.     fluticasone (FLONASE) 50 MCG/ACT nasal spray PLACE 1 SPRAY INTO BOTH NOSTRILS DAILY AS NEEDED FOR ALLERGIES. 16 g 0   gabapentin (NEURONTIN) 600 MG tablet Take 600 mg by mouth in the morning, at noon, in the evening, and at bedtime.  0   hydrOXYzine (VISTARIL) 50 MG capsule Take 50 mg by mouth in the morning, at noon, and at bedtime.     lisinopril (ZESTRIL) 20 MG tablet Take 20 mg by mouth daily.     methocarbamol (ROBAXIN) 500 MG tablet Take 2 tablets (1,000 mg total) by  mouth every 8 (eight) hours as needed for muscle spasms. (Patient taking differently: Take 500 mg by mouth in the morning and at bedtime.) 90 tablet 1   Oxycodone HCl 10 MG TABS Take 10 mg by mouth in the morning, at noon, in the evening, and at bedtime.     PROVENTIL HFA 108 (90 Base) MCG/ACT inhaler INHALE 2 PUFFS INTO THE LUNGS EVERY 6 (SIX) HOURS AS NEEDED FOR WHEEZING OR SHORTNESS OF BREATH. 6.7 Inhaler 0   tetrahydrozoline-zinc (VISINE-AC) 0.05-0.25 % ophthalmic solution Place 2 drops into both eyes 3 (three) times daily as needed (eye allergies).      zolpidem (  AMBIEN CR) 6.25 MG CR tablet Take 6.25 mg by mouth at bedtime as needed for sleep.      No results found for this or any previous visit (from the past 48 hour(s)). No results found.  ROS All pertinent positives and negatives are listed in HPI above  Blood pressure (!) 153/69, pulse 91, temperature 98.5 F (36.9 C), temperature source Oral, resp. rate 18, height 5' 4"  (1.626 m), weight 96.2 kg, SpO2 98 %. Physical Exam  A&O x3, no acute distress PERRLA EOMI Cranial nerves II through XII intact Bilateral upper extremities 5/5 BLE 4+/5 Decree sensation right lower extremity in the L4 dermatome  Assessment/Plan 60 year old female with  L3-4 severe stenosis with synovial cyst, adjacent segment disease   -OR today for L3-4 TLIF, revision of hardware L4-5 -All risks, benefits and expected outcomes were discussed and agreed upon with the patient.   Thank you for allowing me to participate in this patient's care.  Please do not hesitate to call with questions or concerns.   Elwin Sleight, [facility] Cell: [phone]

## 2021-03-23 NOTE — Transfer of Care (Signed)
Immediate Anesthesia Transfer of Care Note  Patient: Dana Holland  Procedure(s) Performed: MINIMALLY INVASIVE transforminal lumbar interbody fusion lumbar three- four, REVISION OF HARDWARE lumbar four-five (Right)  Patient Location: PACU  Anesthesia Type:General  Level of Consciousness: oriented, drowsy, patient cooperative and responds to stimulation  Airway & Oxygen Therapy: Patient Spontanous Breathing and Patient connected to nasal cannula oxygen  Post-op Assessment: Report given to RN, Post -op Vital signs reviewed and stable and Patient moving all extremities X 4  Post vital signs: Reviewed and stable  Last Vitals:  Vitals Value Taken Time  BP 166/76 03/23/21 1215  Temp    Pulse 107 03/23/21 1219  Resp 16 03/23/21 1219  SpO2 97 % 03/23/21 1219  Vitals shown include unvalidated device data.  Last Pain:  Vitals:   03/23/21 0639  TempSrc:   PainSc: 7       Patients Stated Pain Goal: 3 (03/23/21 1031)  Complications: No notable events documented.

## 2021-03-23 NOTE — Progress Notes (Signed)
   Providing Compassionate, Quality Care - Together  NEUROSURGERY PROGRESS NOTE   S: Npt s/e in recovery, complains of LBP, sensation improved in LE c/t preop  O: EXAM:  BP 136/78 (BP Location: Left Arm)   Pulse 81   Temp 98.3 F (36.8 C) (Oral)   Resp 20   Ht 5\' 4"  (1.626 m)   Wt 96.2 kg   SpO2 96%   BMI 36.39 kg/m   Awake, alert, oriented  Speech fluent, appropriate  CNs grossly intact  MAE equally Dressing c/d/I  ASSESSMENT:  60 y.o. 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  Please do not hesitate to call with questions or concerns.   05/23/2021, DO Neurosurgeon [facility] Cell: [phone]

## 2021-03-23 NOTE — Anesthesia Procedure Notes (Signed)
Procedure Name: Intubation Date/Time: 03/23/2021 7:45 AM Performed by: Claris Che, CRNA Pre-anesthesia Checklist: Patient identified, Emergency Drugs available, Suction available, Patient being monitored and Timeout performed Patient Re-evaluated:Patient Re-evaluated prior to induction Oxygen Delivery Method: Circle system utilized Preoxygenation: Pre-oxygenation with 100% oxygen Induction Type: IV induction and Cricoid Pressure applied Ventilation: Mask ventilation without difficulty Laryngoscope Size: Mac and 4 Grade View: Grade I Tube type: Oral Tube size: 7.5 mm Number of attempts: 1 Airway Equipment and Method: Stylet Placement Confirmation: ETT inserted through vocal cords under direct vision, positive ETCO2 and breath sounds checked- equal and bilateral Secured at: 22 cm Tube secured with: Tape Dental Injury: Teeth and Oropharynx as per pre-operative assessment

## 2021-03-23 NOTE — Anesthesia Preprocedure Evaluation (Signed)
Anesthesia Evaluation  Patient identified by MRN, date of birth, ID band Patient awake    Reviewed: Allergy & Precautions, NPO status , Patient's Chart, lab work & pertinent test results  Airway Mallampati: II  TM Distance: >3 FB Neck ROM: Full    Dental no notable dental hx.    Pulmonary COPD, Current Smoker,    Pulmonary exam normal breath sounds clear to auscultation       Cardiovascular hypertension, Pt. on medications Normal cardiovascular exam Rhythm:Regular Rate:Normal     Neuro/Psych negative neurological ROS  negative psych ROS   GI/Hepatic negative GI ROS, Neg liver ROS,   Endo/Other  negative endocrine ROS  Renal/GU negative Renal ROS  negative genitourinary   Musculoskeletal negative musculoskeletal ROS (+)   Abdominal   Peds negative pediatric ROS (+)  Hematology negative hematology ROS (+)   Anesthesia Other Findings   Reproductive/Obstetrics negative OB ROS                             Anesthesia Physical Anesthesia Plan  ASA: 3  Anesthesia Plan: General   Post-op Pain Management:    Induction: Intravenous  PONV Risk Score and Plan: 2 and Ondansetron, Dexamethasone and Treatment may vary due to age or medical condition  Airway Management Planned: Oral ETT  Additional Equipment:   Intra-op Plan:   Post-operative Plan: Extubation in OR  Informed Consent: I have reviewed the patients History and Physical, chart, labs and discussed the procedure including the risks, benefits and alternatives for the proposed anesthesia with the patient or authorized representative who has indicated his/her understanding and acceptance.     Dental advisory given  Plan Discussed with: CRNA and Surgeon  Anesthesia Plan Comments:         Anesthesia Quick Evaluation

## 2021-03-23 NOTE — Anesthesia Postprocedure Evaluation (Signed)
Anesthesia Post Note  Patient: Dana Holland  Procedure(s) Performed: MINIMALLY INVASIVE transforminal lumbar interbody fusion lumbar three- four, REVISION OF HARDWARE lumbar four-five (Right)     Patient location during evaluation: PACU Anesthesia Type: General Level of consciousness: awake and alert Pain management: pain level controlled Vital Signs Assessment: post-procedure vital signs reviewed and stable Respiratory status: spontaneous breathing, nonlabored ventilation, respiratory function stable and patient connected to nasal cannula oxygen Cardiovascular status: blood pressure returned to baseline and stable Postop Assessment: no apparent nausea or vomiting Anesthetic complications: no   No notable events documented.  Last Vitals:  Vitals:   03/23/21 1245 03/23/21 1300  BP: (!) 145/81 129/64  Pulse: (!) 101 96  Resp: 19 18  Temp:    SpO2: 96% 96%    Last Pain:  Vitals:   03/23/21 1310  TempSrc:   PainSc: 7                  Keven Soucy S

## 2021-03-23 NOTE — Op Note (Signed)
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  POSTOP DIAGNOSIS: Same  PROCEDURE: L3-4 transforaminal lumbar interbody fusion with Medtronic catalyft 9 mm interbody device L3-4 right facetectomy and revision laminectomy for resection of synovial cyst and decompression of neural elements Nonsegmental pedicle screw instrumentation, bilateral L3, bilateral L5 with Medtronic Solera (5.5 x 50 mm bilaterally at L3, 6.5 x 45 mm bilaterally at L5) Exploration of fusion, revision of hardware L4-5, with removal of previous pedicle screws L4 bilateral, L5 bilateral Intraoperative use of autograft Intraoperative use of allograft Intraoperative use of microscope for microdissection Intraoperative use of fluoroscopy, greater than 1 hour  SURGEON: Dr. Kendell Bane C. Belmira Daley, DO  ASSISTANT:Josh Julien Girt, NP  ANESTHESIA: General Endotracheal  EBL: 100  SPECIMENS: L3-4 synovial cyst  DRAINS: None  COMPLICATIONS: None  CONDITION: Hemodynamically stable  HISTORY: Dana Holland is a 60 y.o. female with a history of an L4-5 fusion whom had severe stenosis at L3-4 with radiculopathy from a synovial cyst in which I performed a minimally invasive decompression and resected this of synovial cyst.  She did well from this and then had recurrent low back pain and right lower extremity radiculopathy.  Work-up revealed another synovial cyst coming from the contralateral side that was quite large and causing severe stenosis at L3-4.  Therefore we discussed surgical versus conservative measures.  She had failed conservative measures therefore we elected to undergo a surgical intervention in the form of an L3-4 transforaminal lumbar interbody fusion, decompression with resection of synovial cyst and revision of hardware at L4-5.  I discussed all risks benefits and expected outcomes with  the patient and she agreed to proceed.  PROCEDURE IN DETAIL: The patient was brought to the operating room. After induction of general anesthesia, the patient was positioned on the operative table in the prone position. All pressure points were meticulously padded. Skin incision was then marked out and prepped and draped in the usual sterile fashion.  Physician driven timeout was performed.  Biplanar fluoroscopy was sterilely draped and brought into the field.  Paramedian incisions were created bilaterally with a 10 blade and using a Jamshidi the bilateral L3 pedicles were accessed.  K wires were then placed and the Jamshidi's were removed.  Attention was then turned to removal of pedicle screws at L4 and L5 bilaterally.  This was done using tubular dilators through the paramedian incisions.  The tubular dilators were sequentially dilated down over the hardware on the patient's right side.  Using Bovie electrocautery, the soft tissue was cleared from the previous hardware.  The setscrews were removed, the rods were removed and the pedicle screws were then removed.  This again was performed on the patient's left side.  K wires were then placed in the previous screw tracks at L5 bilaterally.  This was performed under direct visualization.  AP and lateral fluoroscopy confirmed similar placement as prior.  Attention was then turned to the decompression and interbody fusion.  The tubular dilators were docked over the right L3-4 facet using lateral fluoroscopy.  A 26 x 6 mm tube was selected.  The microscope was then sterilely draped and brought into the field for the remainder of the procedure.  Soft tissue was cleared from the right facet using Bovie electrocautery.  The facet was removed in totality with a high-speed drill to the ligamentum flavum.  This bone was captured in a bone trap and  used for later autograft.  The ligament was gently elevated from its attachment at the L4 pedicle on the right using  microcurette's.  There was significant scarring of the ligamentum flavum to the thecal sac as well as the large synovial cyst.  This was carefully dissected from the native dura using a series of micro curettes and Kerrison rongeurs.  Again this was significantly adherent.  A portion of the cyst was sent for permanent pathology.  Once since this was completely resected and the ligamentum flavum was completely taken down with micro curettes and Kerrison rongeurs, Camden's triangle was then identified.  Using bipolar cautery, epidural veins were cauterized and then cut sharply with an 11 blade.  The disc space was identified and cut sharply with an 11 blade.  Using a series of disc base shavers, pituitary rongeurs, curettes a radical discectomy was performed.  Once appropriate endplate preparation was performed with curettes and a radical discectomy was performed, the disc base was then trialed and found to be in appropriate size for a 9 mm interbody device.  This was confirmed with lateral fluoroscopy.  The thecal sac was then gently retracted and protected with a retractor.  The disc base was then aggressively packed with autograft and allograft anterior medially to the interbody device.  The interbody device was then selected and placed under live fluoroscopy.  This was then dialed into expansion and lordosis is an expandable cage up until there was appropriate endplate contact and there appeared to be appropriate resistance in the expansion.  The interbody device was then backfilled with further bone graft.  Then further bone graft was packed laterally to the interbody device.  Lateral fluoroscopy confirmed appropriate placement of the interbody device.  Epidural hemostasis was achieved with bipolar cautery and passive hemostatics.  A small piece of Gelfoam was placed over the thecal sac.  Remainder of the bone graft was placed laterally in the lateral gutter on the right.  The tubular dilator was gently  removed while obtaining hemostasis of the soft tissues with bipolar cautery.  The pedicle screws were then placed over the K wires under live fluoroscopy bilaterally at L3 and L5.  The K wires were removed.  The instrumentation was noted to be in appropriate placement on fluoroscopy.  Appropriate sized rods were then measured and contoured and placed percutaneously bilaterally.  Setscrews were placed and final tightened to the manufacturer's recommendation.  Hemostasis was achieved in the wound with bipolar cautery.  The wound was then closed in layers with 2-0 and 3-0 Vicryl sutures.  The skin was closed with skin glue.  Sterile dressing was applied.  At the end of the case all sponge, needle, and instrument counts were correct. The patient was then transferred to the stretcher, extubated, and taken to the post-anesthesia care unit in stable hemodynamic condition.

## 2021-03-24 MED ORDER — OXYCODONE HCL 10 MG PO TABS: 10.0000 mg | ORAL_TABLET | ORAL | 0 refills | Status: AC | PRN

## 2021-03-24 MED ORDER — METHOCARBAMOL 750 MG PO TABS: 750.0000 mg | ORAL_TABLET | Freq: Four times a day (QID) | ORAL | 0 refills | Status: DC | PRN

## 2021-03-24 NOTE — Evaluation (Signed)
Occupational Therapy Evaluation Patient Details Name: Dana Holland MRN: [id number] DOB: [birth year] Today's Date: 03/24/2021    History of Present Illness This 60 y.o. admitted with worsening back pain, neurogenic claudication and Rt greater than Lt LE radiculopathy.   MRI revealed increased size of synovial cyst L3-4 with severe stenosis and adjacent segment disease to her L4-5 fusion.  PMH includes s/p L4-5 fusion, asthma, bipolar disorder, COPD, Fibromyalgia,   Clinical Impression   Patient evaluated by Occupational Therapy with no further acute OT needs identified. All education has been completed and the patient has no further questions. All education completed.  If hand has not improved at follow up with MD, she may benefit from OPOT at that time.  See below for any follow-up Occupational Therapy or equipment needs. OT is signing off. Thank you for this referral.     Follow Up Recommendations  No OT follow up;Supervision/Assistance - 24 hour    Equipment Recommendations  3 in 1 bedside commode    Recommendations for Other Services       Precautions / Restrictions Precautions Precautions: Back Precaution Booklet Issued: Yes (comment) Precaution Comments: reviewed back precautions with pt.  She requires min cues to adhere to them      Mobility Bed Mobility Overal bed mobility: Needs Assistance Bed Mobility: Sidelying to Sit;Rolling;Sit to Sidelying Rolling: Supervision Sidelying to sit: Supervision     Sit to sidelying: Supervision General bed mobility comments: pt instructed in log rolling technique - requires min cues    Transfers                      Balance Overall balance assessment: Needs assistance Sitting-balance support: No upper extremity supported Sitting balance-Leahy Scale: Good     Standing balance support: During functional activity;Single extremity supported Standing balance-Leahy Scale: Poor                              ADL either performed or assessed with clinical judgement   ADL Overall ADL's : Needs assistance/impaired Eating/Feeding: Modified independent   Grooming: Wash/dry hands;Wash/dry face;Oral care;Brushing hair;Min guard;Standing   Upper Body Bathing: Set up;Supervision/ safety;Sitting   Lower Body Bathing: Min guard;Sit to/from stand   Upper Body Dressing : Set up;Sitting;Supervision/safety   Lower Body Dressing: Min guard;Sit to/from stand   Toilet Transfer: Min guard;Ambulation;Comfort height toilet;Grab bars   Toileting- Clothing Manipulation and Hygiene: Min guard;Sit to/from Nurse, children's Details (indicate cue type and reason): Pt reports she will sit to shower Functional mobility during ADLs: Min guard;Rolling walker General ADL Comments: Pt able to perform figure 4.  Reinforced use of reacher to retrieve items from floor     Vision         Perception     Praxis      Pertinent Vitals/Pain Pain Assessment: 0-10 Pain Score: 6  Pain Location: back Pain Descriptors / Indicators: Operative site guarding Pain Intervention(s): Monitored during session;Repositioned;RN gave pain meds during session;Patient requesting pain meds-RN notified     Hand Dominance Right   Extremity/Trunk Assessment Upper Extremity Assessment Upper Extremity Assessment: RUE deficits/detail RUE Deficits / Details: Pt with no MCP extension, or thumb extension - appears to have weakness along radial nerve distribution.  Shoulder 4+/5; elbow 4+/5; wrist flex/ext 4/5, grip 4/5.  She denies sensory changes RUE Coordination: decreased fine motor   Lower Extremity Assessment Lower Extremity Assessment: Defer to PT evaluation  Cervical / Trunk Assessment Cervical / Trunk Assessment: Other exceptions Cervical / Trunk Exceptions: s/p lumbar surgery   Communication Communication Communication: No difficulties   Cognition Arousal/Alertness: Awake/alert Behavior During Therapy:  Impulsive Overall Cognitive Status: Within Functional Limits for tasks assessed                                     General Comments       Exercises Exercises: Other exercises Other Exercises Other Exercises: Pt instructed to passively extend digits 4-6 x/day and to discuss with MD at follow up if hand has not improved - she may need OPOT at that time   Shoulder Instructions      Home Living Family/patient expects to be discharged to:: Private residence Living Arrangements: Children Available Help at Discharge: Family;Available 24 hours/day Type of Home: Mobile home Home Access: Stairs to enter Entrance Stairs-Number of Steps: 5 at back entrance Entrance Stairs-Rails: Right;Left;Can reach both Home Layout: One level     Bathroom Shower/Tub: Tub/shower unit;Curtain   Bathroom Toilet: Handicapped height     Home Equipment: Cane - single point;Shower seat;Adaptive equipment Adaptive Equipment: Reacher        Prior Functioning/Environment Level of Independence: Needs assistance  Gait / Transfers Assistance Needed: ambulated mod I with cane ADL's / Homemaking Assistance Needed: mod I with ADLs, and assist with IADLs   Comments: Pt ambulated with a cane.  She reports she was able to perform ADLs mod I, but required assist for IADLs        OT Problem List: Decreased strength;Decreased activity tolerance;Impaired balance (sitting and/or standing);Decreased knowledge of use of DME or AE;Decreased knowledge of precautions;Pain;Obesity;Impaired UE functional use      OT Treatment/Interventions:      OT Goals(Current goals can be found in the care plan section) Acute Rehab OT Goals Patient Stated Goal: to go home today and to get hand working OT Goal Formulation: All assessment and education complete, DC therapy  OT Frequency:     Barriers to D/C:            Co-evaluation              AM-PAC OT "6 Clicks" Daily Activity     Outcome Measure Help  from another person eating meals?: None Help from another person taking care of personal grooming?: A Little Help from another person toileting, which includes using toliet, bedpan, or urinal?: A Little Help from another person bathing (including washing, rinsing, drying)?: A Little Help from another person to put on and taking off regular upper body clothing?: A Little Help from another person to put on and taking off regular lower body clothing?: A Little 6 Click Score: 19   End of Session Equipment Utilized During Treatment: Rolling walker Nurse Communication: Mobility status  Activity Tolerance: Patient tolerated treatment well Patient left: in bed;with call bell/phone within reach  OT Visit Diagnosis: Pain Pain - part of body:  (back)                Time: 7782-4235 OT Time Calculation (min): 30 min Charges:  OT General Charges $OT Visit: 1 Visit OT Evaluation $OT Eval Moderate Complexity: 1 Mod OT Treatments $Self Care/Home Management : 8-22 mins  .w  Jeani Hawking M 03/24/2021, 8:23 AM

## 2021-03-24 NOTE — Evaluation (Signed)
Physical Therapy Evaluation and Discharge Patient Details Name: Dana Holland MRN: [id number] DOB: Sep 13, 1960 Today's Date: 03/24/2021   History of Present Illness  This 60 y.o. admitted with worsening back pain, neurogenic claudication and Rt greater than Lt LE radiculopathy.   MRI revealed increased size of synovial cyst L3-4 with severe stenosis and adjacent segment disease to her L4-5 fusion.  PMH includes s/p L4-5 fusion, asthma, bipolar disorder, COPD, Fibromyalgia,  Clinical Impression   Patient evaluated by Physical Therapy with no further acute PT needs identified. All education has been completed and the patient has no further questions.  See below for equipment needs. PT is signing off. Thank you for this referral.     Follow Up Recommendations No PT follow up;Supervision - Intermittent    Equipment Recommendations  Rolling walker with 5" wheels    Recommendations for Other Services       Precautions / Restrictions Precautions Precautions: Back Precaution Booklet Issued: Yes (comment) Precaution Comments: Pt able to state 3/3 precautions. She requires min cues to adhere to them (esp with bed mobility)      Mobility  Bed Mobility Overal bed mobility: Needs Assistance Bed Mobility: Rolling Rolling: Supervision (cues to avoid twisting (shoulders advancing before her knees)) Sidelying to sit:  (pt able to describe correct technique; did not want to walk again due to pain)     Sit to sidelying:  (pt able to describe correct technique) General bed mobility comments: pt instructed in log rolling technique - requires min cues    Transfers                 General transfer comment: pt able to describe correct use of RW during transfer; deferred due to recently up and pt too painful  Ambulation/Gait             General Gait Details: deferred due to pain; pt has walked in hallway already this a.m. Described appropriate use of RW  Stairs Stairs:  (pt  deferred stating she has no trouble on stairs and didn't have trouble after previous surgeries)          Wheelchair Mobility    Modified Rankin (Stroke Patients Only)       Balance Overall balance assessment: Needs assistance Sitting-balance support: No upper extremity supported Sitting balance-Leahy Scale: Good     Standing balance support: During functional activity;Single extremity supported Standing balance-Leahy Scale: Poor                               Pertinent Vitals/Pain Pain Assessment: 0-10 Pain Score: 8  Pain Location: back Pain Descriptors / Indicators: Operative site guarding Pain Intervention(s): Limited activity within patient's tolerance;Repositioned;Premedicated before session    Home Living Family/patient expects to be discharged to:: Private residence Living Arrangements: Children Available Help at Discharge: Family;Available 24 hours/day Type of Home: Mobile home Home Access: Stairs to enter Entrance Stairs-Rails: Right;Left;Can reach both Entrance Stairs-Number of Steps: 5 at back entrance Home Layout: One level Home Equipment: Cane - single point;Shower seat;Adaptive equipment      Prior Function Level of Independence: Needs assistance   Gait / Transfers Assistance Needed: ambulated mod I with cane  ADL's / Homemaking Assistance Needed: mod I with ADLs, and assist with IADLs  Comments: Pt ambulated with a cane.  She reports she was able to perform ADLs mod I, but required assist for IADLs     Hand Dominance   Dominant  Hand: Right    Extremity/Trunk Assessment   Upper Extremity Assessment Upper Extremity Assessment: Defer to OT evaluation RUE Deficits / Details: Pt with no MCP extension, or thumb extension - appears to have weakness along radial nerve distribution.  Shoulder 4+/5; elbow 4+/5; wrist flex/ext 4/5, grip 4/5.  She denies sensory changes RUE Coordination: decreased fine motor    Lower Extremity  Assessment Lower Extremity Assessment: Overall WFL for tasks assessed    Cervical / Trunk Assessment Cervical / Trunk Assessment: Other exceptions Cervical / Trunk Exceptions: s/p lumbar surgery  Communication   Communication: No difficulties  Cognition Arousal/Alertness: Awake/alert Behavior During Therapy: Impulsive Overall Cognitive Status: Within Functional Limits for tasks assessed                                        General Comments General comments (skin integrity, edema, etc.): Pt sitting upright in bed after finishing breakfast and very uncomfortable. Ultimately assisted pt into sidelying with HOB flat to alleviate back pain    Exercises Other Exercises Other Exercises: Pt instructed to passively extend digits 4-6 x/day and to discuss with MD at follow up if hand has not improved - she may need OPOT at that time   Assessment/Plan    PT Assessment Patent does not need any further PT services  PT Problem List         PT Treatment Interventions      PT Goals (Current goals can be found in the Care Plan section)  Acute Rehab PT Goals Patient Stated Goal: to go home today and to get hand working PT Goal Formulation: All assessment and education complete, DC therapy    Frequency     Barriers to discharge        Co-evaluation               AM-PAC PT "6 Clicks" Mobility  Outcome Measure Help needed turning from your back to your side while in a flat bed without using bedrails?: A Little Help needed moving from lying on your back to sitting on the side of a flat bed without using bedrails?: A Little Help needed moving to and from a bed to a chair (including a wheelchair)?: A Little Help needed standing up from a chair using your arms (e.g., wheelchair or bedside chair)?: A Little Help needed to walk in hospital room?: A Little Help needed climbing 3-5 steps with a railing? : A Little 6 Click Score: 18    End of Session   Activity  Tolerance: Patient limited by pain Patient left: in bed;with call bell/phone within reach;with family/visitor present Nurse Communication: Other (comment) (pain still 8/10 after pain meds given 1 hour ago) PT Visit Diagnosis: Pain Pain - Right/Left:  (midline) Pain - part of body:  (back)    Time: 1287-8676 PT Time Calculation (min) (ACUTE ONLY): 10 min   Charges:   PT Evaluation $PT Eval Low Complexity: 1 Low           Jerolyn Center, PT Pager [phone]   Zena Amos 03/24/2021, 8:56 AM

## 2021-03-24 NOTE — Discharge Instructions (Signed)
Wound Care Keep incision covered and dry for two days.    Do not put any creams, lotions, or ointments on incision. Leave steri-strips on back.  They will fall off by themselves. Activity Walk each and every day, increasing distance each day. No lifting greater than 5 lbs.  No driving for 2 weeks; may ride as a passenger locally.  Diet Resume your normal diet.  Return to Work Will be discussed at your follow up appointment. Call Your Doctor If Any of These Occur Redness, drainage, or swelling at the wound.  Temperature greater than 101 degrees. Severe pain not relieved by pain medication. Incision starts to come apart. Follow Up Appt Call today for appointment in 1-2 weeks ([phone]) or for problems.  If you have any hardware placed in your spine, you will need an x-ray before your appointment.  

## 2021-03-24 NOTE — Discharge Summary (Signed)
Physician Discharge Summary     Providing Compassionate, Quality Care - Together   Patient ID: Dana Holland MRN: [id number] DOB/AGE: Feb 07, 1961 59 y.o.  Admit date: 03/23/2021 Discharge date: 03/24/2021  Admission Diagnoses: Lumbar spondylosis  Discharge Diagnoses:  Active Problems:   Lumbar spondylosis   Discharged Condition: good  Hospital Course: Patient underwent a minimally invasive L3-4 transforaminal interbody fusion by Dr. Jake Samples on 03/23/2021. She was admitted to 3C04 following recovery from anesthesia in the PACU. Her postoperative course has been uncomplicated. She has worked with both physical and occupational therapies who feel the patient is ready for discharge home. She is ambulating independently and without difficulty. She is tolerating a normal diet. She is not having any bowel or bladder dysfunction. Her pain is reasonably controlled with oral pain medication. She is ready for discharge home.   Consults: None  Significant Diagnostic Studies: radiology: DG Lumbar Spine 2-3 Views  Result Date: 03/23/2021 CLINICAL DATA:  Minimally invasive TLIF L3-4 with revision of hardware at L4-5. EXAM: LUMBAR SPINE - 2-3 VIEW; DG C-ARM 1-60 MIN-NO REPORT COMPARISON:  MRI 02/26/2021.  Radiographs 01/29/2021. FINDINGS: C-arm fluoroscopy was provided in the operating room. 2 minutes and 22 seconds of fluoroscopy time. 105.12 mGy. PA and lateral spot fluoroscopic images of the lumbar spine are submitted postoperatively. These images demonstrate the removal of the bilateral L4 pedicle screws, placement of bilateral L3 pedicle screws and placement of an interbody spacer at L3-4. There are new posterior interconnecting rods. No complications identified. IMPRESSION: Intraoperative views during revision of lumbar fusion, now extending from L3 through L5. No demonstrated complication. Electronically Signed   By: Carey Bullocks M.D.   On: 03/23/2021 14:11   DG C-Arm 1-60 Min-No Report  Result  Date: 03/23/2021 CLINICAL DATA:  Minimally invasive TLIF L3-4 with revision of hardware at L4-5. EXAM: LUMBAR SPINE - 2-3 VIEW; DG C-ARM 1-60 MIN-NO REPORT COMPARISON:  MRI 02/26/2021.  Radiographs 01/29/2021. FINDINGS: C-arm fluoroscopy was provided in the operating room. 2 minutes and 22 seconds of fluoroscopy time. 105.12 mGy. PA and lateral spot fluoroscopic images of the lumbar spine are submitted postoperatively. These images demonstrate the removal of the bilateral L4 pedicle screws, placement of bilateral L3 pedicle screws and placement of an interbody spacer at L3-4. There are new posterior interconnecting rods. No complications identified. IMPRESSION: Intraoperative views during revision of lumbar fusion, now extending from L3 through L5. No demonstrated complication. Electronically Signed   By: Carey Bullocks M.D.   On: 03/23/2021 14:11     Treatments: surgery: MINIMALLY INVASIVE transforminal lumbar interbody fusion lumbar three- four, REVISION OF HARDWARE lumbar four-five (Right)  Discharge Exam: Blood pressure (!) 105/56, pulse 75, temperature 97.8 F (36.6 C), resp. rate 18, height 5\' 4"  (1.626 m), weight 96.2 kg, SpO2 98 %.  Awake, alert, oriented  Speech fluent, appropriate  CNs grossly intact  MAE equally Dressing c/d/I  Disposition: Discharge disposition: 01-Home or Self Care       Discharge Instructions     Incentive spirometry RT   Complete by: As directed       Allergies as of 03/24/2021       Reactions   Imitrex [sumatriptan] Other (See Comments)   Blood vessels in neck felt like they were going to burst, potential stroke   Remeron [mirtazapine] Other (See Comments)   Chest pain   Wellbutrin [bupropion] Hives, Shortness Of Breath, Swelling, Rash   SWELLING REACTION UNSPECIFIED   Ibuprofen Hives, Swelling, Rash   SWELLING REACTION UNSPECIFIED  Keflex [cephalexin] Hives, Swelling   Nsaids Hives, Swelling, Rash   Paroxetine Hcl Other (See Comments)    "FELT LIKE BUGS BITING ALL OVER"   Toradol [ketorolac Tromethamine] Hives, Swelling, Rash   Vioxx [rofecoxib] Hives, Swelling, Rash   SWELLING REACTION UNSPECIFIED    Bextra [valdecoxib]    UNSPECIFIED REACTION    Buspar [buspirone]    UNSPECIFIED REACTION    Compazine [prochlorperazine Edisylate] Other (See Comments)   "Felt like pt was trapped, hallucinations"   Cymbalta [duloxetine Hcl] Other (See Comments)   UNSPECIFIED REACTION   Effexor [venlafaxine] Other (See Comments)   UNSPECIFIED REACTION    Sulfa Antibiotics Hives, Swelling   Voltaren [diclofenac Sodium] Other (See Comments)   Severe intestinal bleeding, stomach in knots   Tramadol Other (See Comments)   Severe headache/migraine        Medication List     TAKE these medications    cloNIDine 0.2 MG tablet Commonly known as: CATAPRES Take 0.2 mg by mouth at bedtime.   ergocalciferol 1.25 MG (50000 UT) capsule Commonly known as: VITAMIN D2 Take 50,000 Units by mouth every Wednesday.   FLUoxetine 20 MG tablet Commonly known as: PROZAC Take 20 mg by mouth daily.   fluticasone 50 MCG/ACT nasal spray Commonly known as: FLONASE PLACE 1 SPRAY INTO BOTH NOSTRILS DAILY AS NEEDED FOR ALLERGIES.   gabapentin 600 MG tablet Commonly known as: NEURONTIN Take 600 mg by mouth in the morning, at noon, in the evening, and at bedtime.   hydrOXYzine 50 MG capsule Commonly known as: VISTARIL Take 50 mg by mouth in the morning, at noon, and at bedtime.   lisinopril 20 MG tablet Commonly known as: ZESTRIL Take 20 mg by mouth daily.   methocarbamol 750 MG tablet Commonly known as: ROBAXIN Take 1 tablet (750 mg total) by mouth every 6 (six) hours as needed for muscle spasms. What changed:  medication strength how much to take when to take this   Oxycodone HCl 10 MG Tabs Take 1 tablet (10 mg total) by mouth every 4 (four) hours as needed for moderate pain (postoperative pain). What changed:  when to take  this reasons to take this   Proventil HFA 108 (90 Base) MCG/ACT inhaler Generic drug: albuterol INHALE 2 PUFFS INTO THE LUNGS EVERY 6 (SIX) HOURS AS NEEDED FOR WHEEZING OR SHORTNESS OF BREATH.   Visine-AC 0.05-0.25 % ophthalmic solution Generic drug: tetrahydrozoline-zinc Place 2 drops into both eyes 3 (three) times daily as needed (eye allergies).   zolpidem 6.25 MG CR tablet Commonly known as: AMBIEN CR Take 6.25 mg by mouth at bedtime as needed for sleep.               Durable Medical Equipment  (From admission, onward)           Start     Ordered   03/24/21 0809  For home use only DME 3 n 1  Once        03/24/21 0809            Follow-up Information     Dawley, Troy C, DO. Go on 04/09/2021.   Why: First postop appointment is at 2:00 pm on 04/09/2021. Contact information: [address] [place] [address] [place] [address] [phone]                 Signed: Val Eagle, DNP, AGNP-C Nurse Practitioner  [facility] [address], [address], [place], [place] [address] P: [phone]  F: [phone]  03/24/2021, 8:16 AM

## 2021-03-24 NOTE — Progress Notes (Signed)
Patient was transported via wheelchair by NT for discharge home; in no acute distress but complaints of mild pain on her back; incision is clean, dry and intact; room was checked and accounted for all her belongings; DME equipments carried along with him; discharge instructions given to patient by RN and she verbalized understanding on the instructions given.

## 2021-03-26 ENCOUNTER — Encounter (HOSPITAL_COMMUNITY): Payer: Self-pay | Admitting: Neurological Surgery

## 2021-03-28 LAB — SURGICAL PATHOLOGY

## 2021-05-28 ENCOUNTER — Other Ambulatory Visit: Payer: Self-pay | Admitting: Family Medicine

## 2021-05-28 DIAGNOSIS — Z122 Encounter for screening for malignant neoplasm of respiratory organs: Secondary | ICD-10-CM

## 2021-06-18 ENCOUNTER — Other Ambulatory Visit: Payer: Self-pay | Admitting: Family Medicine

## 2021-06-18 DIAGNOSIS — Z1231 Encounter for screening mammogram for malignant neoplasm of breast: Secondary | ICD-10-CM

## 2021-07-02 ENCOUNTER — Ambulatory Visit: Payer: Medicaid Other

## 2021-07-24 ENCOUNTER — Ambulatory Visit: Payer: Medicaid Other

## 2021-08-09 ENCOUNTER — Inpatient Hospital Stay: Admission: RE | Admit: 2021-08-09 | Payer: Medicaid Other | Source: Ambulatory Visit

## 2021-12-18 ENCOUNTER — Ambulatory Visit: Payer: Medicaid Other | Admitting: Internal Medicine

## 2021-12-18 NOTE — Progress Notes (Deleted)
Office Visit Note  Patient: Dana Holland             Date of Birth: Aug 29, 1960           MRN: [id number]             PCP: Medicine, [facility] Referring: Teressa Senter, FNP Visit Date: 12/18/2021 Occupation: @GUAROCC @  Subjective:  No chief complaint on file.   History of Present Illness: Geniva Lohnes is a 61 y.o. female here for evaluation of arthritis in both hands with development of finger joint nodules.***   Activities of Daily Living:  Patient reports morning stiffness for *** {minute/hour:19697}.   Patient {ACTIONS;DENIES/REPORTS:21021675::"Denies"} nocturnal pain.  Difficulty dressing/grooming: {ACTIONS;DENIES/REPORTS:21021675::"Denies"} Difficulty climbing stairs: {ACTIONS;DENIES/REPORTS:21021675::"Denies"} Difficulty getting out of chair: {ACTIONS;DENIES/REPORTS:21021675::"Denies"} Difficulty using hands for taps, buttons, cutlery, and/or writing: {ACTIONS;DENIES/REPORTS:21021675::"Denies"}  No Rheumatology ROS completed.   PMFS History:  Patient Active Problem List   Diagnosis Date Noted   Lumbar spondylosis 03/23/2021   Bipolar disease, chronic ([place]) 05/21/2016   Elevated blood-pressure reading without diagnosis of hypertension 05/21/2016   Lumbar stenosis with neurogenic claudication 01/01/2016   HNP (herniated nucleus pulposus), cervical 08/03/2015    Past Medical History:  Diagnosis Date   Anxiety    Asthma    Bipolar disorder ([place])    takes Lamictal daily   Chronic back pain    Chronic back pain    COPD (chronic obstructive pulmonary disease) (HCC)    DDD (degenerative disc disease)    DDD (degenerative disc disease), lumbar    DDD (degenerative disc disease), lumbar    Depression    takes Prozac daily   DJD (degenerative joint disease)    Fibromyalgia    Headache    migraines   History of kidney stones    History of palpitations    "I was checked out by Dr. Percival Spanish and everything okay"   Hypertension     Insomnia    takes Ambien nightly   Lumbar stenosis with neurogenic claudication    Neck pain    Seasonal allergies    Shortness of breath dyspnea    with exertion    Family History  Problem Relation Age of Onset   Heart disease Mother    Arthritis Mother    Hyperlipidemia Mother    Hypertension Father    Heart disease Father    Diabetes Father    Hyperlipidemia Father    Hypertension Other    Heart disease Other    Arthritis Sister    COPD Sister    Depression Sister    Hyperlipidemia Sister    Hyperlipidemia Brother    Hypertension Brother    Asthma Son    Arthritis Maternal Grandmother    Diabetes Maternal Grandmother    Heart disease Maternal Grandmother    Heart disease Maternal Grandfather    Arthritis Paternal Grandmother    Heart disease Paternal Grandmother    Heart disease Paternal Grandfather    Past Surgical History:  Procedure Laterality Date   ABDOMINAL EXPLORATION SURGERY     ABDOMINAL HYSTERECTOMY     ANTERIOR CERVICAL DECOMP/DISCECTOMY FUSION N/A 08/03/2015   Procedure: ACDF C5-C6  C6-C7;  Surgeon: Jovita Gamma, MD;  Location: [facility];  Service: Neurosurgery;  Laterality: N/A;  ACDF C5-C6  C6-C7   BACK SURGERY     lower back   BREAST SURGERY     CARPAL TUNNEL RELEASE Right 11/29/2015   Procedure: CARPAL TUNNEL RELEASE-Right;  Surgeon: Jovita Gamma, MD;  Location:  [place] [facility];  Service: Neurosurgery;  Laterality: Right;  CARPAL TUNNEL RELEASE-Right   CARPAL TUNNEL RELEASE Left 05/23/2016   Procedure: Left CARPAL TUNNEL RELEASE with Bier block;  Surgeon: Jovita Gamma, MD;  Location: [place];  Service: Neurosurgery;  Laterality: Left;  Left CARPAL TUNNEL RELEASE with Bier block   CHOLECYSTECTOMY     DILATION AND CURETTAGE OF UTERUS     KNEE SURGERY     LUMBAR LAMINECTOMY/ DECOMPRESSION WITH MET-RX N/A 06/27/2020   Procedure: MIS MICRODISCECTOMY RIGHT, LUMBAR FIVE-SACRAL ONE, LUMBAR THREE-FOUR BILATERAL MIS LAMINECTOMY;  Surgeon: Dawley, Theodoro Doing,  DO;  Location: [place];  Service: Neurosurgery;  Laterality: N/A;   MULTIPLE TOOTH EXTRACTIONS     TRANSFORAMINAL LUMBAR INTERBODY FUSION W/ MIS 1 LEVEL Right 03/23/2021   Procedure: MINIMALLY INVASIVE transforminal lumbar interbody fusion lumbar three- four, REVISION OF HARDWARE lumbar four-five;  Surgeon: Dawley, Theodoro Doing, DO;  Location: [place];  Service: Neurosurgery;  Laterality: Right;  3C   TUBAL LIGATION     Social History   Social History Narrative   Not on file    There is no immunization history on file for this patient.   Objective: Vital Signs: There were no vitals taken for this visit.   Physical Exam   Musculoskeletal Exam: ***  CDAI Exam: CDAI Score: -- Patient Global: --; Provider Global: -- Swollen: --; Tender: -- Joint Exam 12/18/2021   No joint exam has been documented for this visit   There is currently no information documented on the homunculus. Go to the Rheumatology activity and complete the homunculus joint exam.  Investigation: No additional findings.  Imaging: No results found.  Recent Labs: Lab Results  Component Value Date   WBC 15.7 (H) 03/14/2021   HGB 16.6 (H) 03/14/2021   PLT 234 03/14/2021   NA 138 03/14/2021   K 3.6 03/14/2021   CL 102 03/14/2021   CO2 28 03/14/2021   GLUCOSE 99 03/14/2021   BUN 8 03/14/2021   CREATININE 0.86 03/14/2021   BILITOT 0.7 02/17/2020   ALKPHOS 95 02/17/2020   AST 28 02/17/2020   ALT 36 02/17/2020   PROT 7.0 02/17/2020   ALBUMIN 3.2 (L) 02/17/2020   CALCIUM 9.2 03/14/2021   GFRAA >60 02/17/2020    Speciality Comments: No specialty comments available.  Procedures:  No procedures performed Allergies: Imitrex [sumatriptan], Remeron [mirtazapine], Wellbutrin [bupropion], Ibuprofen, Keflex [cephalexin], Nsaids, Paroxetine hcl, Toradol [ketorolac tromethamine], Vioxx [rofecoxib], Bextra [valdecoxib], Buspar [buspirone], Compazine [prochlorperazine edisylate], Cymbalta [duloxetine hcl], Effexor  [venlafaxine], Sulfa antibiotics, Voltaren [diclofenac sodium], and Tramadol   Assessment / Plan:     Visit Diagnoses: No diagnosis found.  Orders: No orders of the defined types were placed in this encounter.  No orders of the defined types were placed in this encounter.   Face-to-face time spent with patient was *** minutes. Greater than 50% of time was spent in counseling and coordination of care.  Follow-Up Instructions: No follow-ups on file.   Collier Salina, MD  Note - This record has been created using [employer].  Chart creation errors have been sought, but may not always  have been located. Such creation errors do not reflect on  the standard of medical care.

## 2022-03-25 NOTE — Progress Notes (Unsigned)
Office Visit Note  Patient: Dana Holland             Date of Birth: 02/27/1961           MRN: [id number]             PCP: Medicine, [facility] Referring: Medicine, [place]* Visit Date: 03/26/2022   Subjective:  New Patient (Initial Visit) (Knots on right hand, swelling, stiffness, low grade fever, redness around joints, fingers locking)   History of Present Illness: Dana Holland is a 61 y.o. female here for evaluation of arthritis and nodules affecting bilateral hands.  She has longstanding chronic pain related to lumbar spondylosis on long-term pain medication.***  The current level of hand pains are a new problem only for several months. She has had previous carpal tunnel release surgery of both wrists around 2017. But not severe amount of pain in the fingers until recently. Worst affected areas are the MCP joints, also nodule on the right 5th PIP. She experiences intermittent swelling and sometimes redness around affected joints. This comes with severe stiffness and worsening of trigger finger at multiple digits. She feels like she has flulike malaise and generalized aches during these. Sometimes swelling in the right great toe similar to the hands but she has decreased sensation in her foot so less pain. Partial relief with pain medications, NSAIDs, and running hot water over affected areas.  Activities of Daily Living:  Patient reports morning stiffness for 12 hours.   Patient Denies nocturnal pain.  Difficulty dressing/grooming: Reports Difficulty climbing stairs: Reports Difficulty getting out of chair: Reports Difficulty using hands for taps, buttons, cutlery, and/or writing: Reports  Review of Systems  Constitutional:  Positive for fatigue and fever.  HENT:  Positive for mouth sores and mouth dryness.   Eyes:  Positive for dryness.  Respiratory:  Positive for shortness of breath.   Cardiovascular:  Positive for palpitations. Negative for chest  pain.  Gastrointestinal:  Negative for blood in stool, constipation and diarrhea.  Endocrine: Negative for increased urination.  Genitourinary:  Negative for involuntary urination.  Musculoskeletal:  Positive for joint pain, gait problem, joint pain, joint swelling, myalgias, muscle weakness, morning stiffness, muscle tenderness and myalgias.  Skin:  Positive for hair loss. Negative for color change, rash and sensitivity to sunlight.  Allergic/Immunologic: Negative for susceptible to infections.  Neurological:  Positive for headaches. Negative for dizziness.  Hematological:  Negative for swollen glands.  Psychiatric/Behavioral:  Positive for depressed mood and sleep disturbance. The patient is nervous/anxious.     PMFS History:  Patient Active Problem List   Diagnosis Date Noted   Lumbar spondylosis 03/23/2021   Bipolar disease, chronic ([place]) 05/21/2016   Elevated blood-pressure reading without diagnosis of hypertension 05/21/2016   Lumbar stenosis with neurogenic claudication 01/01/2016   HNP (herniated nucleus pulposus), cervical 08/03/2015    Past Medical History:  Diagnosis Date   Anxiety    Asthma    Bipolar disorder ([place])    takes Lamictal daily   Chronic back pain    Chronic back pain    COPD (chronic obstructive pulmonary disease) (HCC)    DDD (degenerative disc disease)    DDD (degenerative disc disease), lumbar    DDD (degenerative disc disease), lumbar    Depression    takes Prozac daily   DJD (degenerative joint disease)    Fibromyalgia    Headache    migraines   History of kidney stones    History of palpitations    "  I was checked out by Dr. Percival Spanish and everything okay"   Hypertension    Insomnia    takes Ambien nightly   Lumbar stenosis with neurogenic claudication    Neck pain    Seasonal allergies    Shortness of breath dyspnea    with exertion    Family History  Problem Relation Age of Onset   Heart disease Mother    Arthritis Mother     Hyperlipidemia Mother    Hypertension Father    Heart disease Father    Diabetes Father    Hyperlipidemia Father    Arthritis Sister    COPD Sister    Depression Sister    Hyperlipidemia Sister    Hyperlipidemia Brother    Hypertension Brother    Arthritis Maternal Grandmother    Diabetes Maternal Grandmother    Heart disease Maternal Grandmother    Heart disease Maternal Grandfather    Arthritis Paternal Grandmother    Heart disease Paternal Grandmother    Heart disease Paternal Grandfather    Asthma Son    Hypertension Other    Heart disease Other    Past Surgical History:  Procedure Laterality Date   ABDOMINAL EXPLORATION SURGERY     ABDOMINAL HYSTERECTOMY     ANTERIOR CERVICAL DECOMP/DISCECTOMY FUSION N/A 08/03/2015   Procedure: ACDF C5-C6  C6-C7;  Surgeon: Jovita Gamma, MD;  Location: [facility];  Service: Neurosurgery;  Laterality: N/A;  ACDF C5-C6  C6-C7   BACK SURGERY     lower back   BREAST SURGERY     CARPAL TUNNEL RELEASE Right 11/29/2015   Procedure: CARPAL TUNNEL RELEASE-Right;  Surgeon: Jovita Gamma, MD;  Location: [place] [facility];  Service: Neurosurgery;  Laterality: Right;  CARPAL TUNNEL RELEASE-Right   CARPAL TUNNEL RELEASE Left 05/23/2016   Procedure: Left CARPAL TUNNEL RELEASE with Bier block;  Surgeon: Jovita Gamma, MD;  Location: [place];  Service: Neurosurgery;  Laterality: Left;  Left CARPAL TUNNEL RELEASE with Bier block   CHOLECYSTECTOMY     DILATION AND CURETTAGE OF UTERUS     KNEE SURGERY     LUMBAR LAMINECTOMY/ DECOMPRESSION WITH MET-RX N/A 06/27/2020   Procedure: MIS MICRODISCECTOMY RIGHT, LUMBAR FIVE-SACRAL ONE, LUMBAR THREE-FOUR BILATERAL MIS LAMINECTOMY;  Surgeon: Dawley, Theodoro Doing, DO;  Location: [place];  Service: Neurosurgery;  Laterality: N/A;   MULTIPLE TOOTH EXTRACTIONS     TRANSFORAMINAL LUMBAR INTERBODY FUSION W/ MIS 1 LEVEL Right 03/23/2021   Procedure: MINIMALLY INVASIVE transforminal lumbar interbody fusion lumbar three- four, REVISION  OF HARDWARE lumbar four-five;  Surgeon: Dawley, Theodoro Doing, DO;  Location: [place];  Service: Neurosurgery;  Laterality: Right;  3C   TUBAL LIGATION     Social History   Social History Narrative   Not on file    There is no immunization history on file for this patient.   Objective: Vital Signs: BP 123/76 (BP Location: Right Arm, Patient Position: Sitting, Cuff Size: Normal)   Pulse 66   Resp 16   Ht 5' 3.75" (1.619 m)   Wt 197 lb (89.4 kg)   BMI 34.08 kg/m    Physical Exam Constitutional:      Appearance: She is obese.  Cardiovascular:     Rate and Rhythm: Normal rate and regular rhythm.  Pulmonary:     Effort: Pulmonary effort is normal.     Breath sounds: Normal breath sounds.  Musculoskeletal:     Right lower leg: No edema.     Left lower leg: No edema.  Lymphadenopathy:     Cervical: No cervical adenopathy.  Skin:    General: Skin is warm and dry.     Findings: No rash.  Neurological:     Mental Status: She is alert.  Psychiatric:        Mood and Affect: Mood normal.      Musculoskeletal Exam:  Left shoulder pain with overhead abduction, no pain behind back in lower positions Elbows full ROM no tenderness or swelling Wrists full ROM no tenderness or swelling Fingers full ROM mild MCP joint tenderness no palpable swelling, 5th PIP painless cyst Knees full ROM no tenderness or swelling Ankles full ROM no tenderness or swelling Right 1st MTP with dorsal side nodule, no palpable swelling  Investigation: No additional findings.  Imaging: No results found.  Recent Labs: Lab Results  Component Value Date   WBC 15.7 (H) 03/14/2021   HGB 16.6 (H) 03/14/2021   PLT 234 03/14/2021   NA 138 03/14/2021   K 3.6 03/14/2021   CL 102 03/14/2021   CO2 28 03/14/2021   GLUCOSE 99 03/14/2021   BUN 8 03/14/2021   CREATININE 0.86 03/14/2021   BILITOT 0.7 02/17/2020   ALKPHOS 95 02/17/2020   AST 28 02/17/2020   ALT 36 02/17/2020   PROT 7.0 02/17/2020   ALBUMIN 3.2  (L) 02/17/2020   CALCIUM 9.2 03/14/2021   GFRAA >60 02/17/2020    Speciality Comments: No specialty comments available.  Procedures:  No procedures performed Allergies: Imitrex [sumatriptan], Remeron [mirtazapine], Wellbutrin [bupropion], Ibuprofen, Keflex [cephalexin], Nsaids, Paroxetine hcl, Toradol [ketorolac tromethamine], Vioxx [rofecoxib], Bextra [valdecoxib], Buspar [buspirone], Compazine [prochlorperazine edisylate], Cymbalta [duloxetine hcl], Effexor [venlafaxine], Sulfa antibiotics, Voltaren [diclofenac sodium], and Tramadol   Assessment / Plan:     Visit Diagnoses: No diagnosis found.  Orders: No orders of the defined types were placed in this encounter.  No orders of the defined types were placed in this encounter.   Face-to-face time spent with patient was *** minutes. Greater than 50% of time was spent in counseling and coordination of care.  Follow-Up Instructions: No follow-ups on file.   Christopher W Rice, MD  Note - This record has been created using Dragon software.  Chart creation errors have been sought, but may not always  have been located. Such creation errors do not reflect on  the standard of medical care.  

## 2022-03-26 ENCOUNTER — Encounter: Payer: Self-pay | Admitting: Internal Medicine

## 2022-03-26 ENCOUNTER — Ambulatory Visit (INDEPENDENT_AMBULATORY_CARE_PROVIDER_SITE_OTHER): Payer: Medicaid Other

## 2022-03-26 ENCOUNTER — Ambulatory Visit: Payer: Medicaid Other | Attending: Internal Medicine | Admitting: Internal Medicine

## 2022-03-26 VITALS — BP 123/76 | HR 66 | Resp 16 | Ht 63.75 in | Wt 197.0 lb

## 2022-03-26 DIAGNOSIS — M79641 Pain in right hand: Secondary | ICD-10-CM

## 2022-03-26 DIAGNOSIS — M79674 Pain in right toe(s): Secondary | ICD-10-CM

## 2022-03-26 DIAGNOSIS — M79642 Pain in left hand: Secondary | ICD-10-CM

## 2022-03-28 LAB — CYCLIC CITRUL PEPTIDE ANTIBODY, IGG: Cyclic Citrullin Peptide Ab: <16 UNITS

## 2022-03-28 LAB — SEDIMENTATION RATE: Sed Rate: 34 mm/h — ABNORMAL HIGH (ref 0–30)

## 2022-03-29 ENCOUNTER — Telehealth: Payer: Self-pay

## 2022-03-29 DIAGNOSIS — M79641 Pain in right hand: Secondary | ICD-10-CM

## 2022-03-29 NOTE — Telephone Encounter (Signed)
-----   Message from Fuller Plan, MD sent at 03/29/2022  1:17 PM EDT ----- Lab test is negative for other antibody marker of rheumatoid arthritis.  The sedimentation rate test is very slightly elevated this can indicate inflammation but the small abnormality could also come from causes like her other arthritis and ongoing c igarette smoking.  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 With the negative lab tests I do not think rheumatoid arthritis is the cause of her symptoms and she might benefit more to see a orthopedic hand specialist about the progressive joint changes.  If she is interested we could refer for this.

## 2022-03-29 NOTE — Progress Notes (Signed)
Lab test is negative for other antibody marker of rheumatoid arthritis.  The sedimentation rate test is very slightly elevated this can indicate inflammation but the small abnormality could also come from causes like her other arthritis and ongoing cigarette smoking.  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 With the negative lab tests I do not think rheumatoid arthritis is the cause of her symptoms and she might benefit more to see a orthopedic hand specialist about the progressive joint changes.  If she is interested we could refer for this.

## 2022-04-11 ENCOUNTER — Other Ambulatory Visit: Payer: Self-pay | Admitting: Neurosurgery

## 2022-04-11 DIAGNOSIS — M5416 Radiculopathy, lumbar region: Secondary | ICD-10-CM

## 2022-05-05 ENCOUNTER — Other Ambulatory Visit: Payer: Medicaid Other

## 2022-05-05 ENCOUNTER — Ambulatory Visit
Admission: RE | Admit: 2022-05-05 | Discharge: 2022-05-05 | Disposition: A | Discharge status: Home / Self Care | Payer: Medicaid Other | Source: Ambulatory Visit | Attending: Neurosurgery | Admitting: Neurosurgery

## 2022-05-05 DIAGNOSIS — M5416 Radiculopathy, lumbar region: Secondary | ICD-10-CM

## 2022-05-05 MED ORDER — GADOPICLENOL 0.5 MMOL/ML IV SOLN
8.0000 mL | Freq: Once | INTRAVENOUS | Status: AC | PRN
  Administered 2022-05-05: 8 mL via INTRAVENOUS

## 2022-07-10 LAB — EXTERNAL GENERIC LAB PROCEDURE: COLOGUARD: POSITIVE — AB

## 2022-08-06 ENCOUNTER — Ambulatory Visit: Payer: Medicaid Other | Admitting: Physical Therapy

## 2023-10-13 ENCOUNTER — Other Ambulatory Visit: Payer: Self-pay | Admitting: Neurological Surgery

## 2023-10-13 DIAGNOSIS — G959 Disease of spinal cord, unspecified: Secondary | ICD-10-CM

## 2023-10-27 ENCOUNTER — Encounter: Payer: Self-pay | Admitting: Neurological Surgery

## 2023-10-28 ENCOUNTER — Ambulatory Visit
Admission: RE | Admit: 2023-10-28 | Discharge: 2023-10-28 | Discharge status: Home / Self Care | Source: Ambulatory Visit | Attending: Neurological Surgery

## 2023-10-28 DIAGNOSIS — G959 Disease of spinal cord, unspecified: Secondary | ICD-10-CM

## 2023-11-19 ENCOUNTER — Encounter: Payer: Self-pay | Admitting: Surgery

## 2023-11-27 ENCOUNTER — Other Ambulatory Visit: Payer: Self-pay | Admitting: Neurosurgery

## 2023-11-27 DIAGNOSIS — M5440 Lumbago with sciatica, unspecified side: Secondary | ICD-10-CM

## 2023-11-28 ENCOUNTER — Encounter: Payer: Self-pay | Admitting: Neurosurgery

## 2023-12-01 NOTE — Pre-Procedure Instructions (Addendum)
 Surgical Instructions   Your procedure is scheduled on Dec 03, 2023. Report to [facility] Main Entrance "A" at 12:30 P.M., then check in with the Admitting office. Any questions or running late day of surgery: call [phone]  Questions prior to your surgery date: call [phone], Monday-Friday, 8am-4pm. If you experience any cold or flu symptoms such as cough, fever, chills, shortness of breath, etc. between now and your scheduled surgery, please notify us  at the above number.     Remember:  Do not eat or drink after midnight the night before your surgery    Take these medicines the morning of surgery with A SIP OF WATER: BREZTRI AEROSPHERE inhaler  gabapentin  (NEURONTIN )  hydrOXYzine  (VISTARIL )    May take these medicines IF NEEDED: fluticasone  (FLONASE ) nasal spray  LORazepam  (ATIVAN )  methocarbamol  (ROBAXIN )  ondansetron  (ZOFRAN -ODT)  PROVENTIL  HFA 108 (90 Base) inhaler - please bring inhaler with you day of surgery tetrahydrozoline-zinc (VISINE-AC) ophthalmic solution    Follow your surgeon's instructions on when to stop Aspirin.  If no instructions were given by your surgeon then you will need to call the office to get those instructions.     One week prior to surgery, STOP taking any Aleve , Naproxen , Ibuprofen, Motrin, Advil, Goody's, BC's, all herbal medications, fish oil, and non-prescription vitamins.   HOW TO MANAGE YOUR DIABETES BEFORE AND AFTER SURGERY  Why is it important to control my blood sugar before and after surgery? Improving blood sugar levels before and after surgery helps healing and can limit problems. A way of improving blood sugar control is eating a healthy diet by:  Eating less sugar and carbohydrates  Increasing activity/exercise  Talking with your doctor about reaching your blood sugar goals High blood sugars (greater than 180 mg/dL) can raise your risk of infections and slow your recovery, so you will need to focus on controlling your  diabetes during the weeks before surgery. Make sure that the doctor who takes care of your diabetes knows about your planned surgery including the date and location.  How do I manage my blood sugar before surgery? Check your blood sugar at least 4 times a day, starting 2 days before surgery, to make sure that the level is not too high or low.  Check your blood sugar the morning of your surgery when you wake up and every 2 hours until you get to the Short Stay unit.  If your blood sugar is less than 70 mg/dL, you will need to treat for low blood sugar: Do not take insulin. Treat a low blood sugar (less than 70 mg/dL) with  cup of clear juice (cranberry or apple), 4 glucose tablets, OR glucose gel. Recheck blood sugar in 15 minutes after treatment (to make sure it is greater than 70 mg/dL). If your blood sugar is not greater than 70 mg/dL on recheck, call [phone] for further instructions. Report your blood sugar to the short stay nurse when you get to Short Stay.  If you are admitted to the hospital after surgery: Your blood sugar will be checked by the staff and you will probably be given insulin after surgery (instead of oral diabetes medicines) to make sure you have good blood sugar levels. The goal for blood sugar control after surgery is 80-180 mg/dL.                      Do NOT Smoke (Tobacco/Vaping) for 24 hours prior to your procedure.  If you use a CPAP  at night, you may bring your mask/headgear for your overnight stay.   You will be asked to remove any contacts, glasses, piercing's, hearing aid's, dentures/partials prior to surgery. Please bring cases for these items if needed.    Patients discharged the day of surgery will not be allowed to drive home, and someone needs to stay with them for 24 hours.  SURGICAL WAITING ROOM VISITATION Patients may have no more than 2 support people in the waiting area - these visitors may rotate.   Pre-op nurse will coordinate an  appropriate time for 1 ADULT support person, who may not rotate, to accompany patient in pre-op.  Children under the age of 30 must have an adult with them who is not the patient and must remain in the main waiting area with an adult.  If the patient needs to stay at the hospital during part of their recovery, the visitor guidelines for inpatient rooms apply.  Please refer to the [facility] website for the visitor guidelines for any additional information.   If you received a COVID test during your pre-op visit  it is requested that you wear a mask when out in public, stay away from anyone that may not be feeling well and notify your surgeon if you develop symptoms. If you have been in contact with anyone that has tested positive in the last 10 days please notify you surgeon.      Pre-operative CHG Bathing Instructions   You can play a key role in reducing the risk of infection after surgery. Your skin needs to be as free of germs as possible. You can reduce the number of germs on your skin by washing with CHG (chlorhexidine  gluconate) soap before surgery. CHG is an antiseptic soap that kills germs and continues to kill germs even after washing.   DO NOT use if you have an allergy to chlorhexidine /CHG or antibacterial soaps. If your skin becomes reddened or irritated, stop using the CHG and notify one of our RNs at [phone].              TAKE A SHOWER THE NIGHT BEFORE SURGERY AND THE DAY OF SURGERY    Please keep in mind the following:  DO NOT shave, including legs and underarms, 48 hours prior to surgery.   You may shave your face before/day of surgery.  Place clean sheets on your bed the night before surgery Use a clean washcloth (not used since being washed) for each shower. DO NOT sleep with pet's night before surgery.  CHG Shower Instructions:  Wash your face and private area with normal soap. If you choose to wash your hair, wash first with your normal shampoo.  After you use  shampoo/soap, rinse your hair and body thoroughly to remove shampoo/soap residue.  Turn the water OFF and apply half the bottle of CHG soap to a CLEAN washcloth.  Apply CHG soap ONLY FROM YOUR NECK DOWN TO YOUR TOES (washing for 3-5 minutes)  DO NOT use CHG soap on face, private areas, open wounds, or sores.  Pay special attention to the area where your surgery is being performed.  If you are having back surgery, having someone wash your back for you may be helpful. Wait 2 minutes after CHG soap is applied, then you may rinse off the CHG soap.  Pat dry with a clean towel  Put on clean pajamas    Additional instructions for the day of surgery: DO NOT APPLY any lotions, deodorants, cologne, or  perfumes.   Do not wear jewelry or makeup Do not wear nail polish, gel polish, artificial nails, or any other type of covering on natural nails (fingers and toes) Do not bring valuables to the hospital. [facility] is not responsible for valuables/personal belongings. Put on clean/comfortable clothes.  Please brush your teeth.  Ask your nurse before applying any prescription medications to the skin.

## 2023-12-02 ENCOUNTER — Encounter (HOSPITAL_COMMUNITY): Payer: Self-pay

## 2023-12-02 ENCOUNTER — Other Ambulatory Visit: Payer: Self-pay

## 2023-12-02 ENCOUNTER — Encounter (HOSPITAL_COMMUNITY)
Admission: RE | Admit: 2023-12-02 | Discharge: 2023-12-02 | Disposition: A | Discharge status: Home / Self Care | Source: Ambulatory Visit | Attending: Neurosurgery | Admitting: Neurosurgery

## 2023-12-02 VITALS — BP 143/73 | HR 59 | Temp 98.4°F | Resp 18 | Ht 64.0 in | Wt 187.0 lb

## 2023-12-02 DIAGNOSIS — Z01818 Encounter for other preprocedural examination: Secondary | ICD-10-CM | POA: Diagnosis present

## 2023-12-02 DIAGNOSIS — I2510 Atherosclerotic heart disease of native coronary artery without angina pectoris: Secondary | ICD-10-CM | POA: Insufficient documentation

## 2023-12-02 HISTORY — DX: Type 2 diabetes mellitus without complications: E11.9

## 2023-12-02 LAB — CBC
HCT: 48 % — ABNORMAL HIGH (ref 36.0–46.0)
Hemoglobin: 15.9 g/dL — ABNORMAL HIGH (ref 12.0–15.0)
MCH: 32.6 pg (ref 26.0–34.0)
MCHC: 33.1 g/dL (ref 30.0–36.0)
MCV: 98.4 fL (ref 80.0–100.0)
Platelets: 110 10*3/uL — ABNORMAL LOW (ref 150–400)
RBC: 4.88 MIL/uL (ref 3.87–5.11)
RDW: 14.7 % (ref 11.5–15.5)
WBC: 10.1 10*3/uL (ref 4.0–10.5)
nRBC: 0 % (ref 0.0–0.2)

## 2023-12-02 LAB — BASIC METABOLIC PANEL WITH GFR
Anion gap: 10 (ref 5–15)
BUN: 8 mg/dL (ref 8–23)
CO2: 23 mmol/L (ref 22–32)
Calcium: 9.1 mg/dL (ref 8.9–10.3)
Chloride: 102 mmol/L (ref 98–111)
Creatinine, Ser: 0.76 mg/dL (ref 0.44–1.00)
GFR, Estimated: >60 mL/min (ref >60)
Glucose, Bld: 114 mg/dL — ABNORMAL HIGH (ref 70–99)
Potassium: 3.4 mmol/L — ABNORMAL LOW (ref 3.5–5.1)
Sodium: 135 mmol/L (ref 135–145)

## 2023-12-02 LAB — TYPE AND SCREEN
ABO/RH(D): A POS
Antibody Screen: NEGATIVE

## 2023-12-02 LAB — SURGICAL PCR SCREEN
MRSA, PCR: NEGATIVE
Staphylococcus aureus: NEGATIVE

## 2023-12-02 LAB — HEMOGLOBIN A1C
Hgb A1c MFr Bld: 5.4 % (ref 4.8–5.6)
Mean Plasma Glucose: 108 mg/dL

## 2023-12-02 NOTE — Progress Notes (Signed)
 PCP - Lorre Rosin, NP with [facility] Cardiologist - Pt saw Dr. Eilleen Grates in 2006 for palpitations. Stress/echo normal. No follow-up needed  PPM/ICD - Denies Device Orders - n/a Rep Notified - n/a  Chest x-ray - n/a EKG - 12/02/2023 Stress Test - Per pt, around 2006 ECHO - Per pt, around 2006 Cardiac Cath - Denies  Sleep Study - Denies CPAP - n/a  Pt is DM2 diet controlled. She does not have a meter at home and does not check her blood sugar. A1c result is pending.  Last dose of GLP1 agonist- n/a GLP1 instructions: n/a  Blood Thinner Instructions: n/a Aspirin Instructions: Last dose of ASA was 11/24/23  NPO after midnight  COVID TEST- n/a   Anesthesia review: Yes. Abnormal EKG review. Hx of HTN, DM2, Asthma/COPD.   Patient denies shortness of breath, fever, cough and chest pain at PAT appointment. Pt denies any respiratory illness/infection in the last two months.   All instructions explained to the patient, with a verbal understanding of the material. Patient agrees to go over the instructions while at home for a better understanding. Patient also instructed to self quarantine after being tested for COVID-19. The opportunity to ask questions was provided.

## 2023-12-03 ENCOUNTER — Other Ambulatory Visit: Payer: Self-pay

## 2023-12-03 ENCOUNTER — Observation Stay (HOSPITAL_COMMUNITY)
Admission: RE | Admit: 2023-12-03 | Discharge: 2023-12-04 | Disposition: A | Discharge status: Home / Self Care | Attending: Neurosurgery | Admitting: Neurosurgery

## 2023-12-03 ENCOUNTER — Encounter (HOSPITAL_COMMUNITY)
Admission: RE | Disposition: A | Discharge status: Home / Self Care | Payer: Self-pay | Source: Home / Self Care | Attending: Neurosurgery

## 2023-12-03 ENCOUNTER — Ambulatory Visit (HOSPITAL_COMMUNITY)

## 2023-12-03 ENCOUNTER — Ambulatory Visit (HOSPITAL_COMMUNITY): Payer: Self-pay | Admitting: Physician Assistant

## 2023-12-03 ENCOUNTER — Ambulatory Visit (HOSPITAL_BASED_OUTPATIENT_CLINIC_OR_DEPARTMENT_OTHER): Payer: Self-pay

## 2023-12-03 ENCOUNTER — Encounter (HOSPITAL_COMMUNITY): Payer: Self-pay | Admitting: *Deleted

## 2023-12-03 DIAGNOSIS — I10 Essential (primary) hypertension: Secondary | ICD-10-CM | POA: Diagnosis not present

## 2023-12-03 DIAGNOSIS — M5001 Cervical disc disorder with myelopathy,  high cervical region: Secondary | ICD-10-CM | POA: Insufficient documentation

## 2023-12-03 DIAGNOSIS — E119 Type 2 diabetes mellitus without complications: Secondary | ICD-10-CM | POA: Insufficient documentation

## 2023-12-03 DIAGNOSIS — F17210 Nicotine dependence, cigarettes, uncomplicated: Secondary | ICD-10-CM | POA: Insufficient documentation

## 2023-12-03 DIAGNOSIS — J449 Chronic obstructive pulmonary disease, unspecified: Secondary | ICD-10-CM

## 2023-12-03 DIAGNOSIS — Z7982 Long term (current) use of aspirin: Secondary | ICD-10-CM | POA: Insufficient documentation

## 2023-12-03 DIAGNOSIS — Z79899 Other long term (current) drug therapy: Secondary | ICD-10-CM | POA: Diagnosis not present

## 2023-12-03 DIAGNOSIS — M4802 Spinal stenosis, cervical region: Secondary | ICD-10-CM | POA: Diagnosis present

## 2023-12-03 DIAGNOSIS — G959 Disease of spinal cord, unspecified: Principal | ICD-10-CM | POA: Diagnosis present

## 2023-12-03 DIAGNOSIS — M50021 Cervical disc disorder at C4-C5 level with myelopathy: Secondary | ICD-10-CM | POA: Insufficient documentation

## 2023-12-03 HISTORY — PX: ANTERIOR CERVICAL DECOMP/DISCECTOMY FUSION: SHX1161

## 2023-12-03 LAB — GLUCOSE, CAPILLARY
Glucose-Capillary: 138 mg/dL — ABNORMAL HIGH (ref 70–99)
Glucose-Capillary: 174 mg/dL — ABNORMAL HIGH (ref 70–99)
Glucose-Capillary: 180 mg/dL — ABNORMAL HIGH (ref 70–99)

## 2023-12-03 SURGERY — ANTERIOR CERVICAL DECOMPRESSION/DISCECTOMY FUSION 2 LEVELS
Anesthesia: General | Site: Spine Cervical

## 2023-12-03 MED ORDER — ONDANSETRON HCL 4 MG/2ML IJ SOLN
INTRAMUSCULAR | Status: DC | PRN
  Administered 2023-12-03: 4 mg via INTRAVENOUS

## 2023-12-03 MED ORDER — SODIUM CHLORIDE 0.9% FLUSH: 3.0000 mL | INTRAVENOUS | Status: DC | PRN

## 2023-12-03 MED ORDER — OXYCODONE HCL 5 MG PO TABS
5.0000 mg | ORAL_TABLET | Freq: Once | ORAL | Status: AC | PRN
  Administered 2023-12-03: 5 mg via ORAL

## 2023-12-03 MED ORDER — CHLORHEXIDINE GLUCONATE CLOTH 2 % EX PADS: 6.0000 | MEDICATED_PAD | Freq: Once | CUTANEOUS | Status: DC

## 2023-12-03 MED ORDER — DEXAMETHASONE SODIUM PHOSPHATE 10 MG/ML IJ SOLN
INTRAMUSCULAR | Status: DC | PRN
  Administered 2023-12-03: 10 mg via INTRAVENOUS

## 2023-12-03 MED ORDER — POLYETHYLENE GLYCOL 3350 17 G PO PACK: 17.0000 g | PACK | Freq: Every day | ORAL | Status: DC | PRN

## 2023-12-03 MED ORDER — ACETAMINOPHEN 10 MG/ML IV SOLN
INTRAVENOUS | Status: AC
  Filled 2023-12-03: qty 100

## 2023-12-03 MED ORDER — SODIUM CHLORIDE 0.9 % IV SOLN: 250.0000 mL | INTRAVENOUS | Status: DC

## 2023-12-03 MED ORDER — ALBUTEROL SULFATE (2.5 MG/3ML) 0.083% IN NEBU: 2.5000 mg | INHALATION_SOLUTION | Freq: Four times a day (QID) | RESPIRATORY_TRACT | Status: DC | PRN

## 2023-12-03 MED ORDER — HYDROMORPHONE HCL 1 MG/ML IJ SOLN
INTRAMUSCULAR | Status: AC
  Filled 2023-12-03: qty 0.5

## 2023-12-03 MED ORDER — 0.9 % SODIUM CHLORIDE (POUR BTL) OPTIME
TOPICAL | Status: DC | PRN
  Administered 2023-12-03: 1000 mL

## 2023-12-03 MED ORDER — FLEET ENEMA RE ENEM: 1.0000 | ENEMA | Freq: Once | RECTAL | Status: DC | PRN

## 2023-12-03 MED ORDER — CLONIDINE HCL 0.2 MG PO TABS
0.2000 mg | ORAL_TABLET | Freq: Every day | ORAL | Status: DC
  Administered 2023-12-03: 0.2 mg via ORAL
  Filled 2023-12-03 (×2): qty 1

## 2023-12-03 MED ORDER — ACETAMINOPHEN 650 MG RE SUPP: 650.0000 mg | RECTAL | Status: DC | PRN

## 2023-12-03 MED ORDER — LISINOPRIL 10 MG PO TABS: 10.0000 mg | ORAL_TABLET | Freq: Every day | ORAL | Status: DC | PRN

## 2023-12-03 MED ORDER — ACETAMINOPHEN 10 MG/ML IV SOLN: 1000.0000 mg | Freq: Once | INTRAVENOUS | Status: DC | PRN

## 2023-12-03 MED ORDER — FENTANYL CITRATE (PF) 250 MCG/5ML IJ SOLN
INTRAMUSCULAR | Status: AC
  Filled 2023-12-03: qty 5

## 2023-12-03 MED ORDER — KETAMINE HCL 50 MG/5ML IJ SOSY
PREFILLED_SYRINGE | INTRAMUSCULAR | Status: AC
  Filled 2023-12-03: qty 5

## 2023-12-03 MED ORDER — AMISULPRIDE (ANTIEMETIC) 5 MG/2ML IV SOLN: 10.0000 mg | Freq: Once | INTRAVENOUS | Status: DC | PRN

## 2023-12-03 MED ORDER — MIDAZOLAM HCL 2 MG/2ML IJ SOLN
INTRAMUSCULAR | Status: AC
  Filled 2023-12-03: qty 2

## 2023-12-03 MED ORDER — METHOCARBAMOL 500 MG PO TABS
500.0000 mg | ORAL_TABLET | Freq: Four times a day (QID) | ORAL | Status: DC | PRN
  Administered 2023-12-03 – 2023-12-04 (×3): 500 mg via ORAL
  Filled 2023-12-03 (×3): qty 1

## 2023-12-03 MED ORDER — FENTANYL CITRATE (PF) 100 MCG/2ML IJ SOLN
INTRAMUSCULAR | Status: AC
  Filled 2023-12-03: qty 2

## 2023-12-03 MED ORDER — THROMBIN 5000 UNITS EX KIT
PACK | CUTANEOUS | Status: AC
  Filled 2023-12-03: qty 1

## 2023-12-03 MED ORDER — PROPOFOL 10 MG/ML IV BOLUS
INTRAVENOUS | Status: DC | PRN
  Administered 2023-12-03: 100 mg via INTRAVENOUS

## 2023-12-03 MED ORDER — PROPOFOL 10 MG/ML IV BOLUS
INTRAVENOUS | Status: AC
  Filled 2023-12-03: qty 20

## 2023-12-03 MED ORDER — HYDROMORPHONE HCL 1 MG/ML IJ SOLN
INTRAMUSCULAR | Status: AC
  Filled 2023-12-03: qty 1

## 2023-12-03 MED ORDER — ORAL CARE MOUTH RINSE: 15.0000 mL | Freq: Once | OROMUCOSAL | Status: AC

## 2023-12-03 MED ORDER — VANCOMYCIN HCL IN DEXTROSE 1-5 GM/200ML-% IV SOLN
1000.0000 mg | Freq: Two times a day (BID) | INTRAVENOUS | Status: DC
  Administered 2023-12-04: 1000 mg via INTRAVENOUS
  Filled 2023-12-03: qty 200

## 2023-12-03 MED ORDER — OXYCODONE HCL 5 MG PO TABS: 5.0000 mg | ORAL_TABLET | ORAL | Status: DC | PRN

## 2023-12-03 MED ORDER — ONDANSETRON HCL 4 MG/2ML IJ SOLN
INTRAMUSCULAR | Status: AC
  Filled 2023-12-03: qty 2

## 2023-12-03 MED ORDER — PHENOL 1.4 % MT LIQD
1.0000 | OROMUCOSAL | Status: DC | PRN
  Administered 2023-12-04: 1 via OROMUCOSAL
  Filled 2023-12-03: qty 177

## 2023-12-03 MED ORDER — DEXAMETHASONE SODIUM PHOSPHATE 10 MG/ML IJ SOLN
INTRAMUSCULAR | Status: AC
  Filled 2023-12-03: qty 1

## 2023-12-03 MED ORDER — ROCURONIUM BROMIDE 10 MG/ML (PF) SYRINGE
PREFILLED_SYRINGE | INTRAVENOUS | Status: AC
  Filled 2023-12-03: qty 10

## 2023-12-03 MED ORDER — KETAMINE HCL 50 MG/5ML IJ SOSY
PREFILLED_SYRINGE | INTRAMUSCULAR | Status: DC | PRN
  Administered 2023-12-03 (×2): 15 mg via INTRAVENOUS
  Administered 2023-12-03: 10 mg via INTRAVENOUS

## 2023-12-03 MED ORDER — ONDANSETRON HCL 4 MG/2ML IJ SOLN
4.0000 mg | Freq: Four times a day (QID) | INTRAMUSCULAR | Status: DC | PRN
  Administered 2023-12-03: 4 mg via INTRAVENOUS
  Filled 2023-12-03: qty 2

## 2023-12-03 MED ORDER — ROCURONIUM BROMIDE 10 MG/ML (PF) SYRINGE
PREFILLED_SYRINGE | INTRAVENOUS | Status: DC | PRN
  Administered 2023-12-03 (×4): 20 mg via INTRAVENOUS
  Administered 2023-12-03: 60 mg via INTRAVENOUS

## 2023-12-03 MED ORDER — FENTANYL CITRATE (PF) 250 MCG/5ML IJ SOLN
INTRAMUSCULAR | Status: DC | PRN
  Administered 2023-12-03 (×8): 50 ug via INTRAVENOUS

## 2023-12-03 MED ORDER — LIDOCAINE-EPINEPHRINE 1 %-1:100000 IJ SOLN
INTRAMUSCULAR | Status: DC | PRN
  Administered 2023-12-03: 4 mL

## 2023-12-03 MED ORDER — BUDESON-GLYCOPYRROL-FORMOTEROL 160-9-4.8 MCG/ACT IN AERO
2.0000 | INHALATION_SPRAY | Freq: Two times a day (BID) | RESPIRATORY_TRACT | Status: DC
  Administered 2023-12-04: 2 via RESPIRATORY_TRACT
  Filled 2023-12-03: qty 5.9

## 2023-12-03 MED ORDER — GABAPENTIN 300 MG PO CAPS
600.0000 mg | ORAL_CAPSULE | Freq: Four times a day (QID) | ORAL | Status: DC
  Administered 2023-12-03 – 2023-12-04 (×3): 600 mg via ORAL
  Filled 2023-12-03 (×3): qty 2

## 2023-12-03 MED ORDER — FENTANYL CITRATE (PF) 100 MCG/2ML IJ SOLN
25.0000 ug | INTRAMUSCULAR | Status: DC | PRN
  Administered 2023-12-03 (×3): 50 ug via INTRAVENOUS

## 2023-12-03 MED ORDER — SUGAMMADEX SODIUM 200 MG/2ML IV SOLN
INTRAVENOUS | Status: DC | PRN
Start: 2023-12-03 — End: 2023-12-03
  Administered 2023-12-03: 200 mg via INTRAVENOUS

## 2023-12-03 MED ORDER — OXYCODONE HCL 5 MG/5ML PO SOLN: 5.0000 mg | Freq: Once | ORAL | Status: AC | PRN

## 2023-12-03 MED ORDER — MENTHOL 3 MG MT LOZG: 1.0000 | LOZENGE | OROMUCOSAL | Status: DC | PRN

## 2023-12-03 MED ORDER — METOPROLOL TARTRATE 5 MG/5ML IV SOLN
INTRAVENOUS | Status: DC | PRN
  Administered 2023-12-03: 2 mg via INTRAVENOUS

## 2023-12-03 MED ORDER — SODIUM CHLORIDE 0.9% FLUSH
3.0000 mL | Freq: Two times a day (BID) | INTRAVENOUS | Status: DC
  Administered 2023-12-03: 3 mL via INTRAVENOUS

## 2023-12-03 MED ORDER — LORAZEPAM 0.5 MG PO TABS
0.5000 mg | ORAL_TABLET | Freq: Every day | ORAL | Status: DC | PRN
  Administered 2023-12-04: 0.5 mg via ORAL
  Filled 2023-12-03: qty 1

## 2023-12-03 MED ORDER — VANCOMYCIN HCL IN DEXTROSE 1-5 GM/200ML-% IV SOLN
1000.0000 mg | INTRAVENOUS | Status: AC
  Administered 2023-12-03: 1000 mg via INTRAVENOUS
  Filled 2023-12-03: qty 200

## 2023-12-03 MED ORDER — ACETAMINOPHEN 10 MG/ML IV SOLN
INTRAVENOUS | Status: DC | PRN
  Administered 2023-12-03: 1000 mg via INTRAVENOUS

## 2023-12-03 MED ORDER — HYDROMORPHONE HCL 1 MG/ML IJ SOLN
0.5000 mg | INTRAMUSCULAR | Status: DC | PRN
  Administered 2023-12-03: 0.5 mg via INTRAVENOUS
  Filled 2023-12-03: qty 0.5

## 2023-12-03 MED ORDER — HYDROMORPHONE HCL 1 MG/ML IJ SOLN
INTRAMUSCULAR | Status: DC | PRN
  Administered 2023-12-03 (×2): .25 mg via INTRAVENOUS

## 2023-12-03 MED ORDER — LIDOCAINE 2% (20 MG/ML) 5 ML SYRINGE
INTRAMUSCULAR | Status: DC | PRN
  Administered 2023-12-03: 60 mg via INTRAVENOUS

## 2023-12-03 MED ORDER — LIDOCAINE 2% (20 MG/ML) 5 ML SYRINGE
INTRAMUSCULAR | Status: AC
  Filled 2023-12-03: qty 5

## 2023-12-03 MED ORDER — INSULIN ASPART 100 UNIT/ML IJ SOLN: 0.0000 [IU] | INTRAMUSCULAR | Status: DC | PRN

## 2023-12-03 MED ORDER — OXYCODONE HCL 5 MG PO TABS
10.0000 mg | ORAL_TABLET | ORAL | Status: DC | PRN
  Administered 2023-12-03 – 2023-12-04 (×5): 10 mg via ORAL
  Filled 2023-12-03 (×5): qty 2

## 2023-12-03 MED ORDER — ONDANSETRON HCL 4 MG PO TABS
4.0000 mg | ORAL_TABLET | Freq: Four times a day (QID) | ORAL | Status: DC | PRN
  Administered 2023-12-04: 4 mg via ORAL
  Filled 2023-12-03: qty 1

## 2023-12-03 MED ORDER — OXYCODONE HCL 5 MG PO TABS
ORAL_TABLET | ORAL | Status: AC
  Filled 2023-12-03: qty 1

## 2023-12-03 MED ORDER — LIDOCAINE-EPINEPHRINE 1 %-1:100000 IJ SOLN
INTRAMUSCULAR | Status: AC
  Filled 2023-12-03: qty 1

## 2023-12-03 MED ORDER — METHOCARBAMOL 1000 MG/10ML IJ SOLN: 500.0000 mg | Freq: Four times a day (QID) | INTRAMUSCULAR | Status: DC | PRN

## 2023-12-03 MED ORDER — CHLORHEXIDINE GLUCONATE 0.12 % MT SOLN
15.0000 mL | Freq: Once | OROMUCOSAL | Status: AC
  Administered 2023-12-03: 15 mL via OROMUCOSAL
  Filled 2023-12-03: qty 15

## 2023-12-03 MED ORDER — THROMBIN 5000 UNITS EX SOLR: OROMUCOSAL | Status: DC | PRN

## 2023-12-03 MED ORDER — ZOLPIDEM TARTRATE 5 MG PO TABS: 5.0000 mg | ORAL_TABLET | Freq: Every evening | ORAL | Status: DC | PRN

## 2023-12-03 MED ORDER — HYDROMORPHONE HCL 1 MG/ML IJ SOLN
0.5000 mg | INTRAMUSCULAR | Status: DC | PRN
  Administered 2023-12-03: 0.5 mg via INTRAVENOUS

## 2023-12-03 MED ORDER — HYDROXYZINE HCL 10 MG PO TABS
50.0000 mg | ORAL_TABLET | Freq: Three times a day (TID) | ORAL | Status: DC
Start: 2023-12-03 — End: 2023-12-04
  Administered 2023-12-03: 50 mg via ORAL
  Filled 2023-12-03 (×4): qty 5

## 2023-12-03 MED ORDER — MONTELUKAST SODIUM 10 MG PO TABS
10.0000 mg | ORAL_TABLET | Freq: Every day | ORAL | Status: DC
  Administered 2023-12-03: 10 mg via ORAL
  Filled 2023-12-03: qty 1

## 2023-12-03 MED ORDER — PHENYLEPHRINE 80 MCG/ML (10ML) SYRINGE FOR IV PUSH (FOR BLOOD PRESSURE SUPPORT)
PREFILLED_SYRINGE | INTRAVENOUS | Status: DC | PRN
Start: 2023-12-03 — End: 2023-12-03
  Administered 2023-12-03: 80 ug via INTRAVENOUS

## 2023-12-03 MED ORDER — ACETAMINOPHEN 325 MG PO TABS: 650.0000 mg | ORAL_TABLET | ORAL | Status: DC | PRN

## 2023-12-03 MED ORDER — MIDAZOLAM HCL 2 MG/2ML IJ SOLN
INTRAMUSCULAR | Status: DC | PRN
  Administered 2023-12-03: 2 mg via INTRAVENOUS

## 2023-12-03 MED ORDER — LACTATED RINGERS IV SOLN: INTRAVENOUS | Status: DC

## 2023-12-03 MED ORDER — DOCUSATE SODIUM 100 MG PO CAPS
100.0000 mg | ORAL_CAPSULE | Freq: Two times a day (BID) | ORAL | Status: DC
  Administered 2023-12-03 – 2023-12-04 (×2): 100 mg via ORAL
  Filled 2023-12-03 (×2): qty 1

## 2023-12-03 SURGICAL SUPPLY — 56 items
BAG COUNTER SPONGE SURGICOUNT (BAG) ×1 IMPLANT
BAND RUBBER #18 3X1/16 STRL (MISCELLANEOUS) ×2 IMPLANT
BASKET BONE COLLECTION (BASKET) IMPLANT
BENZOIN TINCTURE PRP APPL 2/3 (GAUZE/BANDAGES/DRESSINGS) ×1 IMPLANT
BIT DRILL NEURO 2X3.1 SFT TUCH (MISCELLANEOUS) ×1 IMPLANT
BLADE CLIPPER SURG (BLADE) IMPLANT
BUR MATCHSTICK NEURO 3.0 LAGG (BURR) ×1 IMPLANT
CANISTER SUCTION 3000ML PPV (SUCTIONS) ×1 IMPLANT
DRAPE C-ARM 42X72 X-RAY (DRAPES) ×2 IMPLANT
DRAPE HALF SHEET 40X57 (DRAPES) IMPLANT
DRAPE LAPAROTOMY 100X72 PEDS (DRAPES) ×1 IMPLANT
DRAPE MICROSCOPE SLANT 54X150 (MISCELLANEOUS) ×1 IMPLANT
DRESSING MEPILEX FLEX 4X4 (GAUZE/BANDAGES/DRESSINGS) ×1 IMPLANT
DRSG OPSITE 4X5.5 SM (GAUZE/BANDAGES/DRESSINGS) ×2 IMPLANT
DRSG OPSITE POSTOP 4X6 (GAUZE/BANDAGES/DRESSINGS) IMPLANT
DURAPREP 26ML APPLICATOR (WOUND CARE) ×1 IMPLANT
ELECT COATED BLADE 2.86 ST (ELECTRODE) ×1 IMPLANT
ELECTRODE REM PT RTRN 9FT ADLT (ELECTROSURGICAL) ×1 IMPLANT
GAUZE 4X4 16PLY ~~LOC~~+RFID DBL (SPONGE) IMPLANT
GLOVE BIO SURGEON STRL SZ7 (GLOVE) ×2 IMPLANT
GLOVE BIOGEL PI IND STRL 8 (GLOVE) ×2 IMPLANT
GLOVE ECLIPSE 8.0 STRL XLNG CF (GLOVE) ×1 IMPLANT
GOWN STRL REUS W/ TWL LRG LVL3 (GOWN DISPOSABLE) ×2 IMPLANT
GOWN STRL REUS W/ TWL XL LVL3 (GOWN DISPOSABLE) ×1 IMPLANT
GOWN STRL REUS W/TWL 2XL LVL3 (GOWN DISPOSABLE) IMPLANT
HEMOSTAT POWDER KIT SURGIFOAM (HEMOSTASIS) ×1 IMPLANT
KIT BASIN OR (CUSTOM PROCEDURE TRAY) ×1 IMPLANT
KIT TURNOVER KIT B (KITS) ×1 IMPLANT
NDL HYPO 22X1.5 SAFETY MO (MISCELLANEOUS) ×1 IMPLANT
NDL SPNL 22GX3.5 QUINCKE BK (NEEDLE) ×1 IMPLANT
NEEDLE HYPO 22X1.5 SAFETY MO (MISCELLANEOUS) ×1 IMPLANT
NEEDLE SPNL 22GX3.5 QUINCKE BK (NEEDLE) ×1 IMPLANT
NS IRRIG 1000ML POUR BTL (IV SOLUTION) ×1 IMPLANT
PACK LAMINECTOMY NEURO (CUSTOM PROCEDURE TRAY) ×1 IMPLANT
PAD ARMBOARD POSITIONER FOAM (MISCELLANEOUS) ×3 IMPLANT
PATTIES SURGICAL .5 X.5 (GAUZE/BANDAGES/DRESSINGS) ×1 IMPLANT
PATTIES SURGICAL 1X1 (DISPOSABLE) ×1 IMPLANT
PIN DISTRACTION TAP 12 DISP (PIN) IMPLANT
PLATE ZEVO 2LVL 37MM (Plate) IMPLANT
PUTTY DBF 1CC CORTICAL FIBERS (Putty) IMPLANT
RASP 3.0MM (RASP) IMPLANT
SCREW 3.5 SELFDRILL 15MM VARI (Screw) IMPLANT
SPACER LORD CASC 14X12X8 7D (Spacer) IMPLANT
SPIKE FLUID TRANSFER (MISCELLANEOUS) ×1 IMPLANT
SPONGE INTESTINAL PEANUT (DISPOSABLE) ×1 IMPLANT
STAPLER SKIN PROX 35W (STAPLE) IMPLANT
STRIP CLOSURE SKIN 1/2X4 (GAUZE/BANDAGES/DRESSINGS) ×1 IMPLANT
SUT MNCRL AB 4-0 PS2 18 (SUTURE) ×1 IMPLANT
SUT SILK 2 0 TIES 10X30 (SUTURE) IMPLANT
SUT VIC AB 3-0 SH 8-18 (SUTURE) ×1 IMPLANT
SYR 30ML SLIP (SYRINGE) ×1 IMPLANT
TAPE CLOTH 3X10 TAN LF (GAUZE/BANDAGES/DRESSINGS) ×1 IMPLANT
TIP KERRISON THIN FOOTPLATE 2M (MISCELLANEOUS) ×1 IMPLANT
TOWEL GREEN STERILE (TOWEL DISPOSABLE) ×1 IMPLANT
TOWEL GREEN STERILE FF (TOWEL DISPOSABLE) ×1 IMPLANT
WATER STERILE IRR 1000ML POUR (IV SOLUTION) ×1 IMPLANT

## 2023-12-03 NOTE — H&P (Signed)
 CC: neck pain/myelopathy  HPI:     Patient is a 63 y.o. female  with a history of C5-7 ACDF by Dr. Cipriano Creeks in 2017 who developed severe neck pain with electrical sharp pains down arms and hands.  She now has difficulty walking due to balance difficulty and poor control of her legs.     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 Lavonne Prairie and everything okay"   Hypertension    Insomnia    takes Ambien  nightly   Lumbar stenosis with neurogenic claudication    Neck pain    Seasonal allergies    Shortness of breath dyspnea    with exertion    Past Surgical History:  Procedure Laterality Date   ABDOMINAL EXPLORATION SURGERY     ABDOMINAL HYSTERECTOMY  2006   ANTERIOR CERVICAL DECOMP/DISCECTOMY FUSION N/A 08/03/2015   Procedure: ACDF C5-C6  C6-C7;  Surgeon: Yvonna Herder, MD;  Location: [facility];  Service: Neurosurgery;  Laterality: N/A;  ACDF C5-C6  C6-C7   BREAST LUMPECTOMY Right    CARPAL TUNNEL RELEASE Right 11/29/2015   Procedure: CARPAL TUNNEL RELEASE-Right;  Surgeon: Yvonna Herder, MD;   Location: [facility];  Service: Neurosurgery;  Laterality: Right;  CARPAL TUNNEL RELEASE-Right   CARPAL TUNNEL RELEASE Left 05/23/2016   Procedure: Left CARPAL TUNNEL RELEASE with Bier block;  Surgeon: Yvonna Herder, MD;  Location: [facility] OR;  Service: Neurosurgery;  Laterality: Left;  Left CARPAL TUNNEL RELEASE with Bier block   CHOLECYSTECTOMY     COLONOSCOPY     DILATION AND CURETTAGE OF UTERUS     KNEE SURGERY Right    LUMBAR LAMINECTOMY/ DECOMPRESSION WITH MET-RX N/A 06/27/2020   Procedure: MIS MICRODISCECTOMY RIGHT, LUMBAR FIVE-SACRAL ONE, LUMBAR THREE-FOUR BILATERAL MIS LAMINECTOMY;  Surgeon: Dawley, Colby Daub, DO;  Location: [facility] OR;  Service: Neurosurgery;  Laterality: N/A;   MULTIPLE TOOTH EXTRACTIONS     TRANSFORAMINAL LUMBAR INTERBODY FUSION W/ MIS 1 LEVEL Right 03/23/2021   Procedure: MINIMALLY INVASIVE transforminal lumbar interbody fusion lumbar three- four, REVISION OF HARDWARE lumbar four-five;  Surgeon: Dawley, Colby Daub, DO;  Location: [facility] OR;  Service: Neurosurgery;  Laterality: Right;  3C   TUBAL LIGATION      Medications Prior to Admission  Medication Sig Dispense Refill Last Dose/Taking   aspirin 325 MG tablet Take 650 mg by mouth every 6 (six) hours as needed for moderate pain (pain score 4-6).   Taking As Needed   BREZTRI AEROSPHERE 160-9-4.8 MCG/ACT AERO inhaler Inhale  2 puffs into the lungs 2 (two) times daily.   Taking   cloNIDine  (CATAPRES ) 0.2 MG tablet Take 0.2 mg by mouth at bedtime.   Taking   fluticasone  (FLONASE ) 50 MCG/ACT nasal spray PLACE 1 SPRAY INTO BOTH NOSTRILS DAILY AS NEEDED FOR ALLERGIES. 16 g 0 Taking As Needed   gabapentin  (NEURONTIN ) 600 MG tablet Take 600 mg by mouth in the morning, at noon, in the evening, and at bedtime.  0 Taking   hydrOXYzine  (VISTARIL ) 50 MG capsule Take 50 mg by mouth in the morning, at noon, and at bedtime.   Taking   lisinopril  (ZESTRIL ) 10 MG tablet Take 10 mg by mouth daily as needed (High Blood Pressure).   Taking As Needed    LORazepam  (ATIVAN ) 0.5 MG tablet Take 0.5 mg by mouth daily as needed for anxiety.   Taking As Needed   methocarbamol  (ROBAXIN ) 750 MG tablet Take 1 tablet (750 mg total) by mouth every 6 (six) hours as needed for muscle spasms. 20 tablet 0 Taking As Needed   montelukast (SINGULAIR) 10 MG tablet Take 10 mg by mouth at bedtime.   Taking   naproxen  sodium (ALEVE ) 220 MG tablet Take 440 mg by mouth 2 (two) times daily as needed (back).   Taking As Needed   ondansetron  (ZOFRAN -ODT) 4 MG disintegrating tablet Take 4 mg by mouth every 8 (eight) hours as needed for nausea or vomiting.   Taking As Needed   oxyCODONE  10 MG TABS Take 1 tablet (10 mg total) by mouth every 4 (four) hours as needed for moderate pain (postoperative pain). 30 tablet 0 Taking As Needed   PROVENTIL  HFA 108 (90 Base) MCG/ACT inhaler INHALE 2 PUFFS INTO THE LUNGS EVERY 6 (SIX) HOURS AS NEEDED FOR WHEEZING OR SHORTNESS OF BREATH. 6.7 Inhaler 0 Taking As Needed   tetrahydrozoline-zinc (VISINE-AC) 0.05-0.25 % ophthalmic solution Place 2 drops into both eyes 3 (three) times daily as needed (eye allergies).    Taking As Needed   Vitamin D, Ergocalciferol, (DRISDOL) 1.25 MG (50000 UNIT) CAPS capsule Take 50,000 Units by mouth every 7 (seven) days.   Taking   zolpidem  (AMBIEN ) 10 MG tablet Take 10 mg by mouth at bedtime as needed for sleep.   Taking As Needed   Allergies  Allergen Reactions   Amoxicillin Other (See Comments)    Penicillin or any "Cillin" meds - Can not take by mouth - causes oral and esophageal "tissue peeling"    Imitrex [Sumatriptan] Other (See Comments)    Blood vessels in neck felt like they were going to burst, potential stroke   Prednisone  Other (See Comments)    Makes pt feel suicidal    Remeron [Mirtazapine] Other (See Comments)    Chest pain   Wellbutrin [Bupropion] Hives, Shortness Of Breath, Swelling and Rash    SWELLING REACTION UNSPECIFIED    Ibuprofen Hives, Swelling and Rash    SWELLING REACTION  UNSPECIFIED    Keflex [Cephalexin] Hives and Swelling   Nsaids Hives, Swelling and Rash    Can take aspirin and aleve     Paroxetine Hcl Other (See Comments)    "FELT LIKE BUGS BITING ALL OVER"   Toradol [Ketorolac Tromethamine] Hives, Swelling and Rash   Vioxx [Rofecoxib] Hives, Swelling and Rash    SWELLING REACTION UNSPECIFIED    Bextra [Valdecoxib]     UNSPECIFIED REACTION    Buspar [Buspirone]     UNSPECIFIED REACTION    Compazine [Prochlorperazine Edisylate] Other (See Comments)    "  Felt like pt was trapped, hallucinations"   Cymbalta [Duloxetine Hcl] Other (See Comments)    UNSPECIFIED REACTION    Effexor [Venlafaxine] Other (See Comments)    UNSPECIFIED REACTION    Methylprednisolone  Other (See Comments)    Made pt feel suicidal    Sulfa Antibiotics Hives and Swelling   Voltaren [Diclofenac Sodium] Other (See Comments)    Severe intestinal bleeding, stomach in knots   Tramadol Other (See Comments)    Severe headache/migraine    Social History   Tobacco Use   Smoking status: Every Day    Current packs/day: 0.50    Average packs/day: 0.5 packs/day for 40.0 years (20.0 ttl pk-yrs)    Types: Cigarettes    Passive exposure: Past   Smokeless tobacco: Never   Tobacco comments:    currently uses Vapor 12/25/15, 5 cigarettes per day currently 04/03/16  Substance Use Topics   Alcohol use: No    Family History  Problem Relation Age of Onset   Heart disease Mother    Arthritis Mother    Hyperlipidemia Mother    Hypertension Father    Heart disease Father    Diabetes Father    Hyperlipidemia Father    Arthritis Sister    COPD Sister    Depression Sister    Hyperlipidemia Sister    Hyperlipidemia Brother    Hypertension Brother    Arthritis Maternal Grandmother    Diabetes Maternal Grandmother    Heart disease Maternal Grandmother    Heart disease Maternal Grandfather    Arthritis Paternal Grandmother    Heart disease Paternal Grandmother    Heart disease  Paternal Grandfather    Asthma Son    Hypertension Other    Heart disease Other      Review of Systems Pertinent items are noted in HPI.  Objective:   No data found. No intake/output data recorded. No intake/output data recorded.      General : Alert, cooperative, no distress, appears stated age   Head:  Normocephalic/atraumatic    Eyes: PERRL, conjunctiva/corneas clear, EOM's intact. Fundi could not be visualized Neck: well-healed incision Chest:  Respirations unlabored Chest wall: no tenderness or deformity Heart: Regular rate and rhythm Abdomen: Soft, nontender and nondistended Extremities: warm and well-perfused Skin: normal turgor, color and texture Neurologic:  Alert, oriented x 3.  Eyes open spontaneously. PERRL, EOMI, VFC, no facial droop. V1-3 intact.  No dysarthria, tongue protrusion symmetric.  CNII-XII intact. 4/5 bilateral hand grip, Fabd, 4+/5 B, D + Hoffman's bilaterally 4+ reflexes Decreased pinprick sensation distal to elbows and in trunk       Data ReviewCBC:  Lab Results  Component Value Date   WBC 10.1 12/02/2023   RBC 4.88 12/02/2023   BMP:  Lab Results  Component Value Date   GLUCOSE 114 (H) 12/02/2023   CO2 23 12/02/2023   BUN 8 12/02/2023   BUN 10 05/21/2016   CREATININE 0.76 12/02/2023   CALCIUM 9.1 12/02/2023   Radiology review:  See clinic note for details Severe cervical stenosis with cord compression, cord signal change  Assessment:   Active Problems:   * No active hospital problems. 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  Risks discussed included, but were not limited to bleeding, pain, infection, dysphagia, dysphonia, scar, spinal fluid leak, neurologic deficit, instability, pseudoarthrosis, adjacent segment disease,  damage to nearby organs, and death.   Informed consent was obtained.

## 2023-12-03 NOTE — Op Note (Signed)
 PREOP DIAGNOSIS: Severe cervical stenosis with myelopathy  POSTOP DIAGNOSIS: Severe cervical stenosis with myelopathy   PROCEDURE: 1. Arthrodesis C3-4, anterior interbody technique, including Discectomy for decompression of spinal cord and exiting nerve roots with foraminotomies  2. Arthrodesis, additional level C4-5 anterior interbody technique, including Discectomy for decompression of spinal cord and exiting nerve roots with foraminotomies  3. Placement of intervertebral biomechanical device C3-4 4. Placement of intervertebral biomechanical device C4-5 5. Placement of anterior instrumentation consisting of interbody plate and screws C3-4-5 6. Use of morselized bone allograft  7. Use of intraoperative microscope  SURGEON: Dr. Arvilla Birmingham, MD  ASSISTANT: Easter Golden, PA.  Please note, no qualified trainees were available to assist with the procedure.  Assistance was required for retraction of the visceral structures to safely allow for instrumentation.  ANESTHESIA: General Endotracheal  EBL: 50 ml  IMPLANTS: Stryker 8 mm small cage  Medtronic 37 mm Zevo plate 15 mm screws x 6  SPECIMENS: None  DRAINS: Medium Hemovac drain  COMPLICATIONS: None immediate  CONDITION: Hemodynamically stable to PACU  HISTORY: This is a 63 year old woman with history of C5-7 ACDF who developed severe neck pain and rapidly progressive myelopathy symptoms.  She was found to have large disc herniations at C4-5 and C3-4 with significant cord compression and cord signal change.  Urgent surgery in the form of C3-4 and C4-5 ACDF with possible removal of plate was recommended.  Risks, benefits, alternatives, and expected convalescence were discussed with the patient.  Risks discussed included but were not limited to bleeding, pain, infection, dysphagia, dysphonia, pseudoarthrosis, hardware failure, adjacent segment disease, CSF leak, neurologic deficits, weakness, numbness, paralysis, coma, and  death. After all questions were answered, informed consent was obtained.  PROCEDURE IN DETAIL: The patient was brought to the operating room and transferred to the operative table. After induction of general anesthesia, the patient was positioned on the operative table in the supine position with all pressure points meticulously padded. The skin of the neck was then prepped and draped in the usual sterile fashion.  After timeout was conducted, the skin was infiltrated with local anesthetic. Skin incision was then made sharply in the left anterior neck and Bovie electrocautery was used to dissect the subcutaneous tissue until the platysma was identified. The platysma was then divided and undermined. The sternocleidomastoid muscle was then identified and, utilizing natural fascial planes in the neck, the prevertebral fascia was identified and the carotid sheath was retracted laterally and the trachea and esophagus retracted medially.  Disc osteophyte over the C5-6 interspace was identified.  Bovie electrocautery was used to dissect in the subperiosteal plane and elevate the bilateral longus coli muscles. Self-retaining retractors were then placed.   The previous anterior cervical plate was dissected from the scar sharply.  It was apparent that getting the appropriate angle to remove the very bottom screws would introduce significant additional risks to the deep neck structures, it was safest to cut the plate between the C5 and 6 vertebrae.  The plate was cut with a metal cutting bur.  Locking mechanism was unfastened and screws were removed and the partial plate was removed.  Large anterior osteophyte was then removed with rongeurs and high-speed drill at C4-5.   Caspar distraction pins were placed in the adjacent bodies to allow for gentle distraction.  At this point, the microscope was draped and brought into the field, and the remainder of the case was done under the microscope using microdissecting  technique.  The C4-5 disc space was  incised sharply and combination of high speed drill, curettes, and rongeurs were use to initially complete a discectomy. The high-speed drill was then used to complete discectomy until the posterior annulus was identified and removed and the posterior longitudinal ligament was identified. Large disc herniation was encountered and was removed in one piece.  Using a nerve hook, the PLL was elevated, and Kerrison rongeurs were used to remove the posterior longitudinal ligament and the ventral thecal sac was identified. Using a combination of curettes and rongeurs, complete decompression of the thecal sac and exiting nerve roots at this level was completed, and verified with easy passage of micro-nerve hook centrally and in the bilateral foramina.  Having completed our decompression, attention was turned to placement of the intervertebral device. Trial spacers were used to select a size 8 mm graft. This graft was then filled with morcellized allograft, and inserted under live fluoroscopy.  Attention was then turned to the C3-4 level. Caspar distraction pin was placed in the adjacent body to allow for gentle distraction of the disc space.  In a similar fashion, discectomy was completed initially with curettes and rongeurs, and completed with the drill.  The PLL was again identified, elevated and incised. There was significant subligamentous disc herniation, especially behind the C3 body.  The disc behind the C4 body was fully retrieved.  Behind the C3 body, the central disc was partially calcified but soft component was successfully retrieved with nerve hook.   Using Kerrison rongeurs, decompression of the spinal cord and exiting roots was completed and confirmed with a dissector. Trial spacers were used to select a 8 mm graft. This graft was then filled with morcellized allograft, and inserted under live fluoroscopy.   After placement of the intervertebral devices, the caspar  pins were removed.  An anterior cervical plate was placed across the interspaces for anterior fixation.  Using a high-speed drill, the cortex of the cervical vertebral bodies was punctured, and screws inserted in the vertebral bodies. Final fluoroscopic images in AP and lateral projections were taken to confirm good hardware placement.  At this point, after all counts were verified to be correct, meticulous hemostasis was secured using a combination of bipolar electrocautery and passive hemostatics.  A medium Hemovac drain was placed in the deep cervical space, tunneled out the skin and secured with a stitch.  The platysma muscle was then closed using interrupted 3-0 Vicryl sutures, and the skin was closed with a 4-0 monocryl in subcutical fashion. Sterile dressings were then applied and the drapes removed.  The patient tolerated the procedure well and was extubated in the room and taken to the postanesthesia care unit in stable condition.  All counts were correct at the end of the procedure.

## 2023-12-03 NOTE — Progress Notes (Signed)
 Pharmacy Antibiotic Note  Dana Holland is a 63 y.o. female admitted on 12/03/2023 with surgical prophylaxis.  Pharmacy has been consulted for vancomycin  dosing.  Per protocl will need MD to determine LOT as pt has drain in place  Plan: Vancomycin   1g IV q12 -goal 15-87mcg/mL F/u LOT, renal func  Height: 5\' 4"  (162.6 cm) Weight: 83.9 kg (185 lb) IBW/kg (Calculated) : 54.7  Temp (24hrs), Avg:98.9 F (37.2 C), Min:98.5 F (36.9 C), Max:99.3 F (37.4 C)  Recent Labs  Lab 12/02/23 1145  WBC 10.1  CREATININE 0.76    Estimated Creatinine Clearance: 75.4 mL/min (by C-G formula based on SCr of 0.76 mg/dL).     Antimicrobials this admission: Vanc 5/14>  Dose adjustments this admission:   Microbiology results: 5/13 MRSA, MSSA PCR neg/neg  Thank you for allowing pharmacy to be a part of this patient's care.  Oralee Billow, PharmD, BCCCP Clinical Pharmacist 12/03/2023 8:21 PM

## 2023-12-03 NOTE — Anesthesia Procedure Notes (Signed)
 Procedure Name: Intubation Date/Time: 12/03/2023 3:05 PM  Performed by: Detric Scalisi J, CRNAPre-anesthesia Checklist: Patient identified, Emergency Drugs available, Suction available and Patient being monitored Patient Re-evaluated:Patient Re-evaluated prior to induction Oxygen Delivery Method: Circle System Utilized Preoxygenation: Pre-oxygenation with 100% oxygen Induction Type: IV induction Ventilation: Mask ventilation without difficulty Laryngoscope Size: Glidescope and 4 Grade View: Grade I Tube type: Oral Tube size: 7.0 mm Number of attempts: 1 Airway Equipment and Method: Stylet and Oral airway Placement Confirmation: ETT inserted through vocal cords under direct vision, positive ETCO2 and breath sounds checked- equal and bilateral Secured at: 23 cm Tube secured with: Tape Dental Injury: Teeth and Oropharynx as per pre-operative assessment

## 2023-12-03 NOTE — Transfer of Care (Signed)
 Immediate Anesthesia Transfer of Care Note  Patient: Dana Holland  Procedure(s) Performed: CERVICAL THREE-FOUR, CERVICAL FOUR-FIVE ANTERIOR CERVICAL DECOMPRESSION/DISCECTOMY FUSION WITH PARTIAL PLATE REMOVAL (Spine Cervical)  Patient Location: PACU  Anesthesia Type:General  Level of Consciousness: awake, alert , and oriented  Airway & Oxygen Therapy: Patient Spontanous Breathing and Patient connected to face mask oxygen  Post-op Assessment: Report given to RN and Post -op Vital signs reviewed and stable  Post vital signs: Reviewed and stable  Last Vitals:  Vitals Value Taken Time  BP 148/77 12/03/23 1830  Temp 37.2 C 12/03/23 1830  Pulse 89 12/03/23 1838  Resp 13 12/03/23 1838  SpO2 92 % 12/03/23 1838  Vitals shown include unfiled device data.  Last Pain:  Vitals:   12/03/23 1309  TempSrc:   PainSc: 10-Worst pain ever      Patients Stated Pain Goal: 4 (12/03/23 1309)  Complications: No notable events documented.

## 2023-12-03 NOTE — Anesthesia Postprocedure Evaluation (Signed)
 Anesthesia Post Note  Patient: Dana Holland  Procedure(s) Performed: CERVICAL THREE-FOUR, CERVICAL FOUR-FIVE ANTERIOR CERVICAL DECOMPRESSION/DISCECTOMY FUSION WITH PARTIAL PLATE REMOVAL (Spine Cervical)     Patient location during evaluation: PACU Anesthesia Type: General Level of consciousness: awake and alert Pain management: pain level controlled Vital Signs Assessment: post-procedure vital signs reviewed and stable Respiratory status: spontaneous breathing, nonlabored ventilation and respiratory function stable Cardiovascular status: blood pressure returned to baseline and stable Postop Assessment: no apparent nausea or vomiting Anesthetic complications: no  No notable events documented.  Last Vitals:  Vitals:   12/03/23 1946 12/03/23 2005  BP: (!) 145/78 (!) 152/74  Pulse: 75 71  Resp: 13 18  Temp:  36.9 C  SpO2: 94% 99%    Last Pain:  Vitals:   12/03/23 2005  TempSrc: Oral  PainSc:                  Tomekia Helton,W. EDMOND

## 2023-12-03 NOTE — Anesthesia Preprocedure Evaluation (Addendum)
 Anesthesia Evaluation  Patient identified by MRN, date of birth, ID band Patient awake    Reviewed: Allergy & Precautions, NPO status , Patient's Chart, lab work & pertinent test results  Airway Mallampati: II  TM Distance: >3 FB Neck ROM: Limited    Dental  (+) Edentulous Lower, Edentulous Upper   Pulmonary shortness of breath, asthma , COPD,  COPD inhaler, Current SmokerPatient did not abstain from smoking.   Pulmonary exam normal        Cardiovascular hypertension, Pt. on medications Normal cardiovascular exam     Neuro/Psych  Headaches PSYCHIATRIC DISORDERS Anxiety Depression Bipolar Disorder      GI/Hepatic negative GI ROS,,,(+)     substance abuse    Endo/Other  diabetes    Renal/GU negative Renal ROS     Musculoskeletal  (+) Arthritis ,  narcotic dependent  Abdominal  (+) + obese  Peds  Hematology negative hematology ROS (+)   Anesthesia Other Findings Cervical myelopathy  Reproductive/Obstetrics                             Anesthesia Physical Anesthesia Plan  ASA: 3  Anesthesia Plan: General   Post-op Pain Management:    Induction: Intravenous  PONV Risk Score and Plan: 2 and Ondansetron , Dexamethasone , Midazolam  and Treatment may vary due to age or medical condition  Airway Management Planned: Video Laryngoscope Planned and Oral ETT  Additional Equipment:   Intra-op Plan:   Post-operative Plan: Extubation in OR  Informed Consent: I have reviewed the patients History and Physical, chart, labs and discussed the procedure including the risks, benefits and alternatives for the proposed anesthesia with the patient or authorized representative who has indicated his/her understanding and acceptance.     Dental advisory given  Plan Discussed with: CRNA  Anesthesia Plan Comments:        Anesthesia Quick Evaluation

## 2023-12-03 NOTE — Progress Notes (Signed)
 Orthopedic Tech Progress Note Patient Details:  Dana Holland [birth year] [id number]  Delivered HANGER ASPEN COLLAR to PACU   Patient ID: [name], female   DOB: 1961/01/18, 62 y.o.   MRN: [id number]  Kermitt Pedlar 12/03/2023, 6:58 PM

## 2023-12-04 ENCOUNTER — Encounter (HOSPITAL_COMMUNITY): Payer: Self-pay | Admitting: Neurosurgery

## 2023-12-04 DIAGNOSIS — M4802 Spinal stenosis, cervical region: Secondary | ICD-10-CM | POA: Diagnosis not present

## 2023-12-04 LAB — GLUCOSE, CAPILLARY
Glucose-Capillary: 113 mg/dL — ABNORMAL HIGH (ref 70–99)
Glucose-Capillary: 109 mg/dL — ABNORMAL HIGH (ref 70–99)

## 2023-12-04 MED ORDER — OXYCODONE-ACETAMINOPHEN 5-325 MG PO TABS: 1.0000 | ORAL_TABLET | ORAL | 0 refills | Status: AC | PRN

## 2023-12-04 MED ORDER — DOCUSATE SODIUM 100 MG PO CAPS
100.0000 mg | ORAL_CAPSULE | Freq: Two times a day (BID) | ORAL | 0 refills | Status: AC
Start: 2023-12-04 — End: ?

## 2023-12-04 MED ORDER — TIZANIDINE HCL 4 MG PO TABS: 4.0000 mg | ORAL_TABLET | Freq: Three times a day (TID) | ORAL | 1 refills | Status: AC | PRN

## 2023-12-04 NOTE — Care Management (Addendum)
 Transition of Care [facility]) - Inpatient Brief Assessment   Patient Details  Name: Dana Holland MRN: [id number] Date of Birth: 03/07/1961  Transition of Care [facility]) CM/SW Contact:    Ronni Colace, RN Phone Number: 12/04/2023, 11:16 AM   Clinical Narrative: Patient presented for surgery ACDF. Has been having frequent falls at home. PT assessment revealed recommendaiton for [facility] PT OT and wheelchair. Called [employer] for wheelchair to be brought to room. Trellis Fries, they cannot take, Awaiting on [facility].- they cannot do,- no contract, [facility] - cannot do Information for follow up with doctor to discuss OP therapy 1300 Spoke with daughter via phone. Wheelchair has not arrived yet. They are aware that we cannot find home health and to follow up with Dr Andy Bannister.  Regarding possible OP PT OT. Checked with [employer] on ETA  Transition of Care Asessment: Insurance and Status: Insurance coverage has been reviewed Patient has primary care physician: No Home environment has been reviewed: [facility] Prior level of function:: Independent, however has had frequent falls Prior/Current Home Services: No current home services Social Drivers of Health Review: SDOH reviewed needs interventions Readmission risk has been reviewed: Yes Transition of care needs: transition of care needs identified, TOC will continue to follow

## 2023-12-04 NOTE — Care Management (Signed)
    Durable Medical Equipment  (From admission, onward)           Start     Ordered   12/04/23 1105  For home use only DME standard manual wheelchair with seat cushion  Once       Comments: Patient suffers from weakness, frequent falls which impairs their ability to perform daily activities like toileting in the home.  A walker will not resolve issue with performing activities of daily living. A wheelchair will allow patient to safely perform daily activities. Patient can safely propel the wheelchair in the home or has a caregiver who can provide assistance. Length of need Lifetime. Accessories: elevating leg rests (ELRs), wheel locks, extensions and anti-tippers.   12/04/23 1111   12/04/23 1033  For home use only DME Tub bench  Once        12/04/23 1034

## 2023-12-04 NOTE — Discharge Summary (Signed)
 Patient ID: Dana Holland MRN: [id number] DOB/AGE: Aug 30, 1960 63 y.o.  Admit date: 12/03/2023 Discharge date: 12/04/2023  Admission Diagnoses: Cervical myelopathy [facility]) [G95.9]   Discharge Diagnoses: Same   Discharged Condition: Stable  Hospital Course:  Dana Holland is a 63 y.o. female who was admitted following an uncomplicated ACDF C3-5, partial plate removal. They were recovered in PACU and transferred to the floor. Hospital course was uncomplicated. Pt stable for discharge today. Pt to f/u in office for routine post op visit. Pt is in agreement w/ plan.    Discharge Exam: Blood pressure (!) 156/80, pulse 66, temperature 98.5 F (36.9 C), temperature source Oral, resp. rate 17, height 5\' 4"  (1.626 m), weight 83.9 kg, SpO2 98%. A&O Speech fluent, appropriate Strength/sensation grossly intact BUE/BLE.  Dressing c/d/I.   Disposition: Discharge disposition: 01-Home or Self Care       Discharge Instructions     Incentive spirometry RT   Complete by: As directed       Allergies as of 12/04/2023       Reactions   Amoxicillin Other (See Comments)   Penicillin or any "Cillin" meds - Can not take by mouth - causes oral and esophageal "tissue peeling"    Imitrex [sumatriptan] Other (See Comments)   Blood vessels in neck felt like they were going to burst, potential stroke   Prednisone  Other (See Comments)   Makes pt feel suicidal    Remeron [mirtazapine] Other (See Comments)   Chest pain   Wellbutrin [bupropion] Hives, Shortness Of Breath, Swelling, Rash   SWELLING REACTION UNSPECIFIED   Ibuprofen Hives, Swelling, Rash   SWELLING REACTION UNSPECIFIED   Keflex [cephalexin] Hives, Swelling   Nsaids Hives, Swelling, Rash   Can take aspirin and aleve     Paroxetine Hcl Other (See Comments)   "FELT LIKE BUGS BITING ALL OVER"   Toradol [ketorolac Tromethamine] Hives, Swelling, Rash   Vioxx [rofecoxib] Hives, Swelling, Rash   SWELLING REACTION UNSPECIFIED     Bextra [valdecoxib]    UNSPECIFIED REACTION    Buspar [buspirone]    UNSPECIFIED REACTION    Compazine [prochlorperazine Edisylate] Other (See Comments)   "Felt like pt was trapped, hallucinations"   Cymbalta [duloxetine Hcl] Other (See Comments)   UNSPECIFIED REACTION   Effexor [venlafaxine] Other (See Comments)   UNSPECIFIED REACTION    Methylprednisolone  Other (See Comments)   Made pt feel suicidal    Sulfa Antibiotics Hives, Swelling   Voltaren [diclofenac Sodium] Other (See Comments)   Severe intestinal bleeding, stomach in knots   Tramadol Other (See Comments)   Severe headache/migraine        Medication List     PAUSE taking these medications    aspirin 325 MG tablet Wait to take this until: Dec 10, 2023 Take 650 mg by mouth every 6 (six) hours as needed for moderate pain (pain score 4-6).       STOP taking these medications    methocarbamol  750 MG tablet Commonly known as: ROBAXIN    naproxen  sodium 220 MG tablet Commonly known as: ALEVE        TAKE these medications    Breztri Aerosphere 160-9-4.8 MCG/ACT Aero inhaler Generic drug: budesonide-glycopyrrolate -formoterol Inhale 2 puffs into the lungs 2 (two) times daily.   cloNIDine  0.2 MG tablet Commonly known as: CATAPRES  Take 0.2 mg by mouth at bedtime.   docusate sodium  100 MG capsule Commonly known as: COLACE Take 1 capsule (100 mg total) by mouth 2 (two) times daily.   fluticasone  50 MCG/ACT nasal  spray Commonly known as: FLONASE  PLACE 1 SPRAY INTO BOTH NOSTRILS DAILY AS NEEDED FOR ALLERGIES.   gabapentin  600 MG tablet Commonly known as: NEURONTIN  Take 600 mg by mouth in the morning, at noon, in the evening, and at bedtime.   hydrOXYzine  50 MG capsule Commonly known as: VISTARIL  Take 50 mg by mouth in the morning, at noon, and at bedtime.   lisinopril  10 MG tablet Commonly known as: ZESTRIL  Take 10 mg by mouth daily as needed (High Blood Pressure).   LORazepam  0.5 MG tablet Commonly  known as: ATIVAN  Take 0.5 mg by mouth daily as needed for anxiety.   montelukast 10 MG tablet Commonly known as: SINGULAIR Take 10 mg by mouth at bedtime.   ondansetron  4 MG disintegrating tablet Commonly known as: ZOFRAN -ODT Take 4 mg by mouth every 8 (eight) hours as needed for nausea or vomiting.   Oxycodone  HCl 10 MG Tabs Take 1 tablet (10 mg total) by mouth every 4 (four) hours as needed for moderate pain (postoperative pain).   oxyCODONE -acetaminophen  5-325 MG tablet Commonly known as: Percocet Take 1 tablet by mouth every 4 (four) hours as needed for severe pain (pain score 7-10).   Proventil  HFA 108 (90 Base) MCG/ACT inhaler Generic drug: albuterol  INHALE 2 PUFFS INTO THE LUNGS EVERY 6 (SIX) HOURS AS NEEDED FOR WHEEZING OR SHORTNESS OF BREATH.   tiZANidine 4 MG tablet Commonly known as: Zanaflex Take 1 tablet (4 mg total) by mouth every 8 (eight) hours as needed for muscle spasms.   Visine-AC 0.05-0.25 % ophthalmic solution Generic drug: tetrahydrozoline-zinc Place 2 drops into both eyes 3 (three) times daily as needed (eye allergies).   Vitamin D (Ergocalciferol) 1.25 MG (50000 UNIT) Caps capsule Commonly known as: DRISDOL Take 50,000 Units by mouth every 7 (seven) days.   zolpidem  10 MG tablet Commonly known as: AMBIEN  Take 10 mg by mouth at bedtime as needed for sleep.               Durable Medical Equipment  (From admission, onward)           Start     Ordered   12/04/23 1033  For home use only DME Tub bench  Once        12/04/23 1034             Signed: Lamae Fosco CAYLIN Dalon Reichart 12/04/2023, 10:46 AM

## 2023-12-04 NOTE — Evaluation (Signed)
 Physical Therapy Evaluation  Patient Details Name: Dana Holland MRN: [id number] DOB: 1960/12/19 Today's Date: 12/04/2023  History of Present Illness  This patient is a 63 y/o female who presents s/p C3-5 ACDF on 12/03/2023. PMH significant for L4-5 fusion, asthma, bipolar disorder, COPD, Fibromyalgia, anxiety, COPD, depression, migraines, HTN.  Clinical Impression  Pt admitted with above diagnosis. At the time of PT eval, pt was able to demonstrate transfers and ambulation with gross min assist, +2 for safety. Pt was educated on precautions, brace application/wearing schedule, appropriate activity progression, and car transfer. Pt currently with functional limitations due to the deficits listed below (see PT Problem List). Pt will benefit from skilled PT to increase their independence and safety with mobility to allow discharge to the venue listed below.  m        If plan is discharge home, recommend the following: A little help with walking and/or transfers;A little help with bathing/dressing/bathroom;Assistance with cooking/housework;Assist for transportation;Help with stairs or ramp for entrance   Can travel by private vehicle        Equipment Recommendations Wheelchair (measurements PT);Wheelchair cushion (measurements PT)  Recommendations for Other Services       Functional Status Assessment Patient has had a recent decline in their functional status and demonstrates the ability to make significant improvements in function in a reasonable and predictable amount of time.     Precautions / Restrictions Precautions Precautions: Cervical Precaution Booklet Issued: Yes (comment) Recall of Precautions/Restrictions: Intact Required Braces or Orthoses: Cervical Brace Cervical Brace: Hard collar (remove brace to shower and in bed) Restrictions Weight Bearing Restrictions Per Provider Order: No      Mobility  Bed Mobility Overal bed mobility: Modified Independent              General bed mobility comments: HOB elevated    Transfers Overall transfer level: Needs assistance Equipment used: Rolling walker (2 wheels) Transfers: Sit to/from Stand Sit to Stand: Min assist           General transfer comment: Assist for safety and balance support. Increased time to initiate movement    Ambulation/Gait Ambulation/Gait assistance: Contact guard assist, +2 safety/equipment Gait Distance (Feet): 30 Feet Assistive device: Rolling walker (2 wheels) Gait Pattern/deviations: Step-through pattern, Decreased stride length, Trunk flexed Gait velocity: Decreased Gait velocity interpretation: <1.31 ft/sec, indicative of household ambulator   General Gait Details: Long strides with uncoordinated landing on B sides. Pt took a seated rest break after ~30' total ambulation.  Stairs Stairs: Yes Stairs assistance: Min assist, +2 physical assistance, +2 safety/equipment Stair Management: Two rails, Step to pattern, Forwards Number of Stairs: 5 General stair comments: VC's for sequencing and general safety. Daughter assisting for education.  Wheelchair Mobility     Tilt Bed    Modified Rankin (Stroke Patients Only)       Balance Overall balance assessment: Needs assistance Sitting-balance support: No upper extremity supported, Feet supported Sitting balance-Leahy Scale: Good     Standing balance support: Bilateral upper extremity supported, Reliant on assistive device for balance Standing balance-Leahy Scale: Poor                               Pertinent Vitals/Pain Pain Assessment Pain Assessment: Faces Faces Pain Scale: Hurts even more Pain Location: incisional at neck, and baseline low back pain Pain Descriptors / Indicators: Aching, Grimacing, Guarding, Sore, Operative site guarding Pain Intervention(s): Limited activity within patient's tolerance, Monitored during session, Repositioned  Home Living Family/patient expects to be  discharged to:: Private residence Living Arrangements: Children Available Help at Discharge: Family;Available 24 hours/day Type of Home: Mobile home Home Access: Stairs to enter Entrance Stairs-Rails: Right;Left;Can reach both Entrance Stairs-Number of Steps: 5     Home Equipment: Rolling Walker (2 wheels);Rollator (4 wheels);Cane - single point Additional Comments: lift chair    Prior Function Prior Level of Function : Independent/Modified Independent             Mobility Comments: Pt with limited ambulation distance as she was falling often (6x in the last month). Pt using a BSC and staying at a transfers only level when able.       Extremity/Trunk Assessment   Upper Extremity Assessment Upper Extremity Assessment: Defer to OT evaluation    Lower Extremity Assessment Lower Extremity Assessment: Generalized weakness    Cervical / Trunk Assessment Cervical / Trunk Assessment: Back Surgery  Communication   Communication Communication: No apparent difficulties    Cognition Arousal: Alert Behavior During Therapy: WFL for tasks assessed/performed   PT - Cognitive impairments: No apparent impairments                         Following commands: Intact       Cueing Cueing Techniques: Verbal cues     General Comments      Exercises     Assessment/Plan    PT Assessment Patient needs continued PT services  PT Problem List Decreased strength;Decreased activity tolerance;Decreased balance;Decreased mobility;Decreased knowledge of use of DME;Decreased safety awareness;Decreased knowledge of precautions;Pain       PT Treatment Interventions DME instruction;Gait training;Stair training;Functional mobility training;Therapeutic activities;Therapeutic exercise;Balance training;Patient/family education    PT Goals (Current goals can be found in the Care Plan section)  Acute Rehab PT Goals Patient Stated Goal: Home at d/c PT Goal Formulation: With  patient/family Time For Goal Achievement: 12/11/23 Potential to Achieve Goals: Good    Frequency Min 5X/week     Co-evaluation               AM-PAC PT "6 Clicks" Mobility  Outcome Measure Help needed turning from your back to your side while in a flat bed without using bedrails?: A Little Help needed moving from lying on your back to sitting on the side of a flat bed without using bedrails?: A Little Help needed moving to and from a bed to a chair (including a wheelchair)?: A Little Help needed standing up from a chair using your arms (e.g., wheelchair or bedside chair)?: A Little Help needed to walk in hospital room?: A Little Help needed climbing 3-5 steps with a railing? : A Little 6 Click Score: 18    End of Session Equipment Utilized During Treatment: Gait belt;Cervical collar Activity Tolerance: Patient tolerated treatment well Patient left: in bed;with call bell/phone within reach;with family/visitor present Nurse Communication: Mobility status PT Visit Diagnosis: Unsteadiness on feet (R26.81);Pain Pain - part of body:  (neck, back)    Time: 8119-1478 PT Time Calculation (min) (ACUTE ONLY): 33 min   Charges:   PT Evaluation $PT Eval Moderate Complexity: 1 Mod PT Treatments $Gait Training: 8-22 mins PT General Charges $$ ACUTE PT VISIT: 1 Visit         Simone Dubois, PT, DPT Acute Rehabilitation Services Secure Chat Preferred Office: [phone]   Dana Holland 12/04/2023, 1:16 PM

## 2023-12-04 NOTE — Progress Notes (Signed)
 Patient alert and oriented, VSS, void, ambulate. D/c instructions explain and given to the patient and daughter all questions answered. Surgical site and dressing clean and dry no sign of infection. Patient d/c home with 3 in 1, wheelchair per order.

## 2023-12-04 NOTE — Evaluation (Signed)
 Occupational Therapy Evaluation and Discharge Patient Details Name: Dana Holland MRN: [id number] DOB: Feb 16, 1961 Today's Date: 12/04/2023   History of Present Illness   This 63 yo female s/p C3-4, C4-5 disectomy, foraminotomies, and fusion. PHMx:  L4-5 fusion, asthma, bipolar disorder, COPD, Fibromyalgia, anxiety, COPD, depression, migraines, HTN     Clinical Impressions This 63 yo female admitted and underwent above presents to acute OT with all education completed and post op cervical  handout provided. No further acute OT, but do recommend HHOT. Acute OT will sign off.     If plan is discharge home, recommend the following:   A little help with walking and/or transfers;A little help with bathing/dressing/bathroom;Assistance with cooking/housework;Assist for transportation     Functional Status Assessment   Patient has had a recent decline in their functional status and demonstrates the ability to make significant improvements in function in a reasonable and predictable amount of time. (without further need for skilled OT acutely, but will benefit from follow up HHOT)     Equipment Recommendations   Tub/shower bench      Precautions/Restrictions   Precautions Precautions: Cervical Precaution Booklet Issued: Yes (comment) Recall of Precautions/Restrictions: Intact Required Braces or Orthoses: Cervical Brace Cervical Brace: Hard collar (remove brace to shower and in bed) Restrictions Weight Bearing Restrictions Per Provider Order: No     Mobility Bed Mobility Overal bed mobility: Modified Independent             General bed mobility comments: HOB up    Transfers Overall transfer level: Needs assistance Equipment used: Rolling walker (2 wheels) Transfers: Sit to/from Stand Sit to Stand: Contact guard assist           General transfer comment: RW      Balance Overall balance assessment: Needs assistance Sitting-balance support: No upper  extremity supported, Feet supported Sitting balance-Leahy Scale: Good     Standing balance support: Bilateral upper extremity supported, Reliant on assistive device for balance Standing balance-Leahy Scale: Poor                             ADL either performed or assessed with clinical judgement   ADL                                         General ADL Comments: Educated on use of straws when drinking from cups, use of 2 cups for mouth care (one to spit and one with straw to rinse), bed mobility, upper and lower body dressing, avoiding extreme movements with neck, wear and care of c-collar. Pt able to stand pivot with RW from bed to [facility] with min A     Vision Baseline Vision/History: 1 Wears glasses Ability to See in Adequate Light: 0 Adequate Patient Visual Report: No change from baseline              Pertinent Vitals/Pain Pain Assessment Pain Assessment: 0-10 Pain Score: 7  Pain Location: incisional Pain Descriptors / Indicators: Aching, Grimacing, Guarding, Sore Pain Intervention(s): Limited activity within patient's tolerance, Monitored during session, Repositioned, Patient requesting pain meds-RN notified     Extremity/Trunk Assessment Upper Extremity Assessment Upper Extremity Assessment: Overall WFL for tasks assessed           Communication Communication Communication: No apparent difficulties   Cognition Arousal: Alert Behavior During Therapy: [facility] for tasks assessed/performed  Cognition: No apparent impairments                               Following commands: Intact       Cueing    Cueing Techniques: Verbal cues              Home Living Family/patient expects to be discharged to:: Private residence Living Arrangements: Children Available Help at Discharge: Family;Available 24 hours/day Type of Home: Mobile home Home Access: Stairs to enter Entrance Stairs-Number of Steps: 5 Entrance Stairs-Rails:  Right;Left;Can reach both       Bathroom Shower/Tub: Tub/shower unit;Curtain   Bathroom Toilet: Handicapped height     Home Equipment: Agricultural consultant (2 wheels);Rollator (4 wheels);Cane - single point   Additional Comments: lift chair      Prior Functioning/Environment Prior Level of Function : Independent/Modified Independent                    OT Problem List: Decreased strength;Impaired balance (sitting and/or standing);Pain        OT Goals(Current goals can be found in the care plan section)   Acute Rehab OT Goals Patient Stated Goal: to go home today         AM-PAC OT "6 Clicks" Daily Activity     Outcome Measure Help from another person eating meals?: None Help from another person taking care of personal grooming?: A Little Help from another person toileting, which includes using toliet, bedpan, or urinal?: A Little Help from another person bathing (including washing, rinsing, drying)?: A Little Help from another person to put on and taking off regular upper body clothing?: A Little Help from another person to put on and taking off regular lower body clothing?: A Little 6 Click Score: 19   End of Session Equipment Utilized During Treatment: Gait belt;Rolling walker (2 wheels);Cervical collar Nurse Communication:  (pt needs a tub bench and HHOT)  Activity Tolerance: Patient limited by pain Patient left: in bed;with call bell/phone within reach  OT Visit Diagnosis: Unsteadiness on feet (R26.81);Other abnormalities of gait and mobility (R26.89);Pain Pain - part of body:  (incisional)                Time: 4098-1191 OT Time Calculation (min): 32 min Charges:  OT General Charges $OT Visit: 1 Visit OT Evaluation $OT Eval Moderate Complexity: 1 Mod OT Treatments $Self Care/Home Management : 8-22 mins  Merryl Abraham OT Acute Rehabilitation Services Office [phone]    Lenox Raider 12/04/2023, 11:24 AM

## 2023-12-23 ENCOUNTER — Other Ambulatory Visit

## 2024-01-06 ENCOUNTER — Other Ambulatory Visit

## 2024-01-13 ENCOUNTER — Ambulatory Visit
Admission: RE | Admit: 2024-01-13 | Discharge: 2024-01-13 | Disposition: A | Discharge status: Home / Self Care | Source: Ambulatory Visit | Attending: Neurosurgery

## 2024-01-13 DIAGNOSIS — M5440 Lumbago with sciatica, unspecified side: Secondary | ICD-10-CM

## 2024-02-05 ENCOUNTER — Other Ambulatory Visit: Payer: Self-pay | Admitting: Surgery

## 2024-02-05 ENCOUNTER — Encounter: Payer: Self-pay | Admitting: Surgery

## 2024-02-05 DIAGNOSIS — M5416 Radiculopathy, lumbar region: Secondary | ICD-10-CM

## 2024-02-06 ENCOUNTER — Encounter: Payer: Self-pay | Admitting: Surgery

## 2024-02-10 ENCOUNTER — Inpatient Hospital Stay: Admission: RE | Admit: 2024-02-10 | Source: Ambulatory Visit

## 2024-02-10 ENCOUNTER — Other Ambulatory Visit

## 2024-02-16 NOTE — Discharge Instructions (Signed)

## 2024-02-17 ENCOUNTER — Inpatient Hospital Stay: Admission: RE | Admit: 2024-02-17 | Source: Ambulatory Visit

## 2024-02-17 ENCOUNTER — Inpatient Hospital Stay
Admission: RE | Admit: 2024-02-17 | Discharge: 2024-02-17 | Disposition: A | Discharge status: Home / Self Care | Source: Ambulatory Visit | Attending: Surgery | Admitting: Surgery

## 2024-04-12 ENCOUNTER — Ambulatory Visit
Admission: RE | Admit: 2024-04-12 | Discharge: 2024-04-12 | Disposition: A | Discharge status: Home / Self Care | Source: Ambulatory Visit | Attending: Surgery | Admitting: Surgery

## 2024-04-12 DIAGNOSIS — M5416 Radiculopathy, lumbar region: Secondary | ICD-10-CM

## 2024-04-12 MED ORDER — IOPAMIDOL (ISOVUE-M 200) INJECTION 41%
15.0000 mL | Freq: Once | INTRAMUSCULAR | Status: AC
  Administered 2024-04-12: 15 mL via INTRATHECAL

## 2024-04-12 MED ORDER — MEPERIDINE HCL 50 MG/ML IJ SOLN: 50.0000 mg | Freq: Once | INTRAMUSCULAR | Status: DC | PRN

## 2024-04-12 MED ORDER — DIAZEPAM 5 MG PO TABS: 10.0000 mg | ORAL_TABLET | Freq: Once | ORAL | Status: DC

## 2024-04-12 MED ORDER — ONDANSETRON HCL 4 MG/2ML IJ SOLN: 4.0000 mg | Freq: Once | INTRAMUSCULAR | Status: DC | PRN

## 2024-04-12 NOTE — Discharge Instructions (Signed)
